# Patient Record
Sex: Male | Born: 1945 | Race: White | Hispanic: No | Marital: Married | Smoking: Former smoker
Health system: Southern US, Community
[De-identification: ages and names within clinical notes are randomized; demographics above are authoritative.]

## PROBLEM LIST (undated history)

## (undated) DIAGNOSIS — R7303 Prediabetes: Secondary | ICD-10-CM

## (undated) DIAGNOSIS — G459 Transient cerebral ischemic attack, unspecified: Secondary | ICD-10-CM

## (undated) DIAGNOSIS — R351 Nocturia: Secondary | ICD-10-CM

## (undated) DIAGNOSIS — R350 Frequency of micturition: Secondary | ICD-10-CM

## (undated) DIAGNOSIS — I10 Essential (primary) hypertension: Secondary | ICD-10-CM

## (undated) DIAGNOSIS — I499 Cardiac arrhythmia, unspecified: Secondary | ICD-10-CM

## (undated) DIAGNOSIS — Z87898 Personal history of other specified conditions: Secondary | ICD-10-CM

## (undated) DIAGNOSIS — M549 Dorsalgia, unspecified: Secondary | ICD-10-CM

## (undated) DIAGNOSIS — I219 Acute myocardial infarction, unspecified: Secondary | ICD-10-CM

## (undated) DIAGNOSIS — I639 Cerebral infarction, unspecified: Secondary | ICD-10-CM

## (undated) DIAGNOSIS — N433 Hydrocele, unspecified: Secondary | ICD-10-CM

## (undated) DIAGNOSIS — R972 Elevated prostate specific antigen [PSA]: Secondary | ICD-10-CM

## (undated) DIAGNOSIS — N400 Enlarged prostate without lower urinary tract symptoms: Secondary | ICD-10-CM

## (undated) DIAGNOSIS — R011 Cardiac murmur, unspecified: Secondary | ICD-10-CM

## (undated) HISTORY — DX: Hydrocele, unspecified: N43.3

## (undated) HISTORY — DX: Benign prostatic hyperplasia without lower urinary tract symptoms: N40.0

## (undated) HISTORY — DX: Elevated prostate specific antigen (PSA): R97.20

## (undated) HISTORY — DX: Nocturia: R35.1

## (undated) HISTORY — DX: Frequency of micturition: R35.0

## (undated) HISTORY — DX: Personal history of other specified conditions: Z87.898

## (undated) HISTORY — DX: Prediabetes: R73.03

## (undated) HISTORY — DX: Transient cerebral ischemic attack, unspecified: G45.9

---
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
## 2006-02-06 ENCOUNTER — Emergency Department (HOSPITAL_COMMUNITY): Admission: EM | Admit: 2006-02-06 | Discharge: 2006-02-06 | Payer: Self-pay | Admitting: Family Medicine

## 2011-03-15 ENCOUNTER — Emergency Department: Payer: Self-pay | Admitting: Emergency Medicine

## 2013-11-02 ENCOUNTER — Encounter (HOSPITAL_COMMUNITY)
Admission: EM | Disposition: A | Discharge status: Home / Self Care | Payer: Self-pay | Source: Home / Self Care | Attending: Cardiology

## 2013-11-02 ENCOUNTER — Emergency Department (HOSPITAL_COMMUNITY): Payer: Medicare Other

## 2013-11-02 ENCOUNTER — Encounter (HOSPITAL_COMMUNITY): Payer: Self-pay | Admitting: Emergency Medicine

## 2013-11-02 ENCOUNTER — Inpatient Hospital Stay (HOSPITAL_COMMUNITY): Payer: Medicare Other

## 2013-11-02 ENCOUNTER — Inpatient Hospital Stay (HOSPITAL_COMMUNITY)
Admission: EM | Admit: 2013-11-02 | Discharge: 2013-11-04 | DRG: 064 | Disposition: A | Discharge status: Home / Self Care | Payer: Medicare Other | Attending: Cardiology | Admitting: Cardiology

## 2013-11-02 ENCOUNTER — Emergency Department: Payer: Self-pay | Admitting: Emergency Medicine

## 2013-11-02 DIAGNOSIS — R4182 Altered mental status, unspecified: Secondary | ICD-10-CM

## 2013-11-02 DIAGNOSIS — I249 Acute ischemic heart disease, unspecified: Secondary | ICD-10-CM

## 2013-11-02 DIAGNOSIS — I10 Essential (primary) hypertension: Secondary | ICD-10-CM | POA: Diagnosis present

## 2013-11-02 DIAGNOSIS — I6340 Cerebral infarction due to embolism of unspecified cerebral artery: Principal | ICD-10-CM | POA: Diagnosis present

## 2013-11-02 DIAGNOSIS — G459 Transient cerebral ischemic attack, unspecified: Secondary | ICD-10-CM

## 2013-11-02 DIAGNOSIS — N400 Enlarged prostate without lower urinary tract symptoms: Secondary | ICD-10-CM | POA: Diagnosis present

## 2013-11-02 DIAGNOSIS — I214 Non-ST elevation (NSTEMI) myocardial infarction: Secondary | ICD-10-CM | POA: Diagnosis present

## 2013-11-02 DIAGNOSIS — Z87891 Personal history of nicotine dependence: Secondary | ICD-10-CM

## 2013-11-02 DIAGNOSIS — F05 Delirium due to known physiological condition: Secondary | ICD-10-CM | POA: Diagnosis present

## 2013-11-02 DIAGNOSIS — R404 Transient alteration of awareness: Secondary | ICD-10-CM

## 2013-11-02 DIAGNOSIS — I359 Nonrheumatic aortic valve disorder, unspecified: Secondary | ICD-10-CM

## 2013-11-02 HISTORY — PX: LEFT HEART CATHETERIZATION WITH CORONARY ANGIOGRAM: SHX5451

## 2013-11-02 HISTORY — DX: Transient cerebral ischemic attack, unspecified: G45.9

## 2013-11-02 HISTORY — DX: Dorsalgia, unspecified: M54.9

## 2013-11-02 LAB — COMPREHENSIVE METABOLIC PANEL
ALBUMIN: 3.4 g/dL (ref 3.4–5.0)
ALK PHOS: 94 U/L
ALT: 16 U/L (ref 0–53)
AST: 18 U/L (ref 0–37)
Albumin: 3.4 g/dL — ABNORMAL LOW (ref 3.5–5.2)
Alkaline Phosphatase: 83 U/L (ref 39–117)
Anion Gap: 4 — ABNORMAL LOW (ref 7–16)
BILIRUBIN TOTAL: 0.3 mg/dL (ref 0.3–1.2)
BUN: 20 mg/dL — ABNORMAL HIGH (ref 7–18)
BUN: 19 mg/dL (ref 6–23)
Bilirubin,Total: 0.3 mg/dL (ref 0.2–1.0)
CALCIUM: 8.6 mg/dL (ref 8.5–10.1)
CHLORIDE: 107 meq/L (ref 96–112)
CO2: 26 meq/L (ref 19–32)
CREATININE: 0.97 mg/dL (ref 0.60–1.30)
CREATININE: 0.78 mg/dL (ref 0.50–1.35)
Calcium: 8.7 mg/dL (ref 8.4–10.5)
Chloride: 110 mmol/L — ABNORMAL HIGH (ref 98–107)
Co2: 31 mmol/L (ref 21–32)
EGFR (African American): >60
GFR calc Af Amer: >90 mL/min (ref >90)
Glucose, Bld: 120 mg/dL — ABNORMAL HIGH (ref 70–99)
Glucose: 106 mg/dL — ABNORMAL HIGH (ref 65–99)
OSMOLALITY: 292 (ref 275–301)
Potassium: 3.5 mmol/L (ref 3.5–5.1)
Potassium: 4.2 mEq/L (ref 3.7–5.3)
SGOT(AST): 24 U/L (ref 15–37)
SGPT (ALT): 25 U/L (ref 12–78)
SODIUM: 145 mmol/L (ref 136–145)
Sodium: 144 mEq/L (ref 137–147)
TOTAL PROTEIN: 6.6 g/dL (ref 6.4–8.2)
Total Protein: 6.2 g/dL (ref 6.0–8.3)

## 2013-11-02 LAB — APTT: APTT: 27 s (ref 24–37)

## 2013-11-02 LAB — TROPONIN I
TROPONIN I: 0.57 ng/mL — AB (ref <0.30)
TROPONIN I: 2.98 ng/mL — AB (ref <0.30)
TROPONIN-I: 0.03 ng/mL
Troponin I: 5.84 ng/mL (ref <0.30)
Troponin I: 6.96 ng/mL (ref <0.30)

## 2013-11-02 LAB — MRSA PCR SCREENING: MRSA by PCR: NEGATIVE

## 2013-11-02 LAB — CBC WITH DIFFERENTIAL/PLATELET
Basophil #: 0 10*3/uL (ref 0.0–0.1)
Basophil %: 0.4 %
Eosinophil #: 0.1 10*3/uL (ref 0.0–0.7)
Eosinophil %: 1.6 %
HCT: 39.5 % — AB (ref 40.0–52.0)
HGB: 13.4 g/dL (ref 13.0–18.0)
Lymphocyte #: 2.2 10*3/uL (ref 1.0–3.6)
Lymphocyte %: 37.4 %
MCH: 30.4 pg (ref 26.0–34.0)
MCHC: 33.9 g/dL (ref 32.0–36.0)
MCV: 90 fL (ref 80–100)
Monocyte #: 0.4 x10 3/mm (ref 0.2–1.0)
Monocyte %: 7.4 %
Neutrophil #: 3.2 10*3/uL (ref 1.4–6.5)
Neutrophil %: 53.2 %
Platelet: 138 10*3/uL — ABNORMAL LOW (ref 150–440)
RBC: 4.41 10*6/uL (ref 4.40–5.90)
RDW: 15.1 % — ABNORMAL HIGH (ref 11.5–14.5)
WBC: 5.9 10*3/uL (ref 3.8–10.6)

## 2013-11-02 LAB — I-STAT CHEM 8, ED
BUN: 18 mg/dL (ref 6–23)
CHLORIDE: 107 meq/L (ref 96–112)
CREATININE: 0.9 mg/dL (ref 0.50–1.35)
Calcium, Ion: 1.14 mmol/L (ref 1.13–1.30)
GLUCOSE: 119 mg/dL — AB (ref 70–99)
HCT: 38 % — ABNORMAL LOW (ref 39.0–52.0)
HEMOGLOBIN: 12.9 g/dL — AB (ref 13.0–17.0)
POTASSIUM: 3.9 meq/L (ref 3.7–5.3)
Sodium: 143 mEq/L (ref 137–147)
TCO2: 27 mmol/L (ref 0–100)

## 2013-11-02 LAB — CBC
HEMATOCRIT: 38.5 % — AB (ref 39.0–52.0)
HEMOGLOBIN: 12.7 g/dL — AB (ref 13.0–17.0)
MCH: 29.1 pg (ref 26.0–34.0)
MCHC: 33 g/dL (ref 30.0–36.0)
MCV: 88.3 fL (ref 78.0–100.0)
Platelets: 136 10*3/uL — ABNORMAL LOW (ref 150–400)
RBC: 4.36 MIL/uL (ref 4.22–5.81)
RDW: 14.3 % (ref 11.5–15.5)
WBC: 5.9 10*3/uL (ref 4.0–10.5)

## 2013-11-02 LAB — CK TOTAL AND CKMB (NOT AT ARMC)
CK, Total: 109 U/L
CK-MB: 2.2 ng/mL (ref 0.5–3.6)

## 2013-11-02 LAB — DIFFERENTIAL
Basophils Absolute: 0 10*3/uL (ref 0.0–0.1)
Basophils Relative: 0 % (ref 0–1)
Eosinophils Absolute: 0.1 10*3/uL (ref 0.0–0.7)
Eosinophils Relative: 1 % (ref 0–5)
LYMPHS ABS: 1 10*3/uL (ref 0.7–4.0)
LYMPHS PCT: 16 % (ref 12–46)
MONO ABS: 0.4 10*3/uL (ref 0.1–1.0)
MONOS PCT: 7 % (ref 3–12)
NEUTROS ABS: 4.4 10*3/uL (ref 1.7–7.7)
Neutrophils Relative %: 76 % (ref 43–77)

## 2013-11-02 LAB — POCT I-STAT TROPONIN I: Troponin i, poc: 0.18 ng/mL (ref 0.00–0.08)

## 2013-11-02 LAB — ETHANOL: Alcohol, Ethyl (B): <11 mg/dL (ref 0–11)

## 2013-11-02 LAB — PROTIME-INR
INR: 1
INR: 0.96 (ref 0.00–1.49)
Prothrombin Time: 12.6 secs (ref 11.5–14.7)
Prothrombin Time: 12.6 seconds (ref 11.6–15.2)

## 2013-11-02 LAB — HEMOGLOBIN A1C
HEMOGLOBIN A1C: 5.6 % (ref <5.7)
Mean Plasma Glucose: 114 mg/dL (ref <117)

## 2013-11-02 SURGERY — LEFT HEART CATHETERIZATION WITH CORONARY ANGIOGRAM
Anesthesia: LOCAL

## 2013-11-02 MED ORDER — SODIUM CHLORIDE 0.9 % IJ SOLN
3.0000 mL | Freq: Two times a day (BID) | INTRAMUSCULAR | Status: DC
  Administered 2013-11-02 – 2013-11-04 (×2): 3 mL via INTRAVENOUS

## 2013-11-02 MED ORDER — SODIUM CHLORIDE 0.9 % IV SOLN
1.0000 mL/kg/h | INTRAVENOUS | Status: AC
Start: 2013-11-02 — End: 2013-11-02
  Administered 2013-11-02: 1 mL/kg/h via INTRAVENOUS

## 2013-11-02 MED ORDER — METOPROLOL TARTRATE 12.5 MG HALF TABLET
12.5000 mg | ORAL_TABLET | Freq: Two times a day (BID) | ORAL | Status: DC
  Administered 2013-11-02 – 2013-11-04 (×5): 12.5 mg via ORAL
  Filled 2013-11-02 (×6): qty 1

## 2013-11-02 MED ORDER — HEPARIN SODIUM (PORCINE) 5000 UNIT/ML IJ SOLN
INTRAMUSCULAR | Status: AC
  Filled 2013-11-02: qty 1

## 2013-11-02 MED ORDER — HEPARIN SODIUM (PORCINE) 1000 UNIT/ML IJ SOLN
INTRAMUSCULAR | Status: AC
  Filled 2013-11-02: qty 1

## 2013-11-02 MED ORDER — FENTANYL CITRATE 0.05 MG/ML IJ SOLN
INTRAMUSCULAR | Status: AC
  Filled 2013-11-02: qty 2

## 2013-11-02 MED ORDER — SODIUM CHLORIDE 0.9 % IJ SOLN: 3.0000 mL | INTRAMUSCULAR | Status: DC | PRN

## 2013-11-02 MED ORDER — ONDANSETRON HCL 4 MG/2ML IJ SOLN
4.0000 mg | Freq: Four times a day (QID) | INTRAMUSCULAR | Status: DC
Start: 2013-11-02 — End: 2013-11-02
  Administered 2013-11-02: 4 mg via INTRAVENOUS
  Filled 2013-11-02: qty 2

## 2013-11-02 MED ORDER — SODIUM CHLORIDE 0.9 % IV SOLN: 250.0000 mL | INTRAVENOUS | Status: DC | PRN

## 2013-11-02 MED ORDER — ASPIRIN 325 MG PO TABS
325.0000 mg | ORAL_TABLET | Freq: Once | ORAL | Status: AC
  Administered 2013-11-02: 325 mg via ORAL
  Filled 2013-11-02: qty 1

## 2013-11-02 MED ORDER — MIDAZOLAM HCL 2 MG/2ML IJ SOLN
INTRAMUSCULAR | Status: AC
  Filled 2013-11-02: qty 2

## 2013-11-02 MED ORDER — LIDOCAINE HCL (PF) 1 % IJ SOLN
INTRAMUSCULAR | Status: AC
  Filled 2013-11-02: qty 30

## 2013-11-02 MED ORDER — NITROGLYCERIN 0.4 MG SL SUBL
0.4000 mg | SUBLINGUAL_TABLET | SUBLINGUAL | Status: DC | PRN
  Administered 2013-11-02 (×2): 0.4 mg via SUBLINGUAL
  Filled 2013-11-02: qty 1

## 2013-11-02 MED ORDER — ONDANSETRON HCL 4 MG/2ML IJ SOLN: 4.0000 mg | Freq: Four times a day (QID) | INTRAMUSCULAR | Status: DC | PRN

## 2013-11-02 MED ORDER — ASPIRIN EC 81 MG PO TBEC
81.0000 mg | DELAYED_RELEASE_TABLET | Freq: Every day | ORAL | Status: DC
  Administered 2013-11-02 – 2013-11-04 (×3): 81 mg via ORAL
  Filled 2013-11-02 (×4): qty 1

## 2013-11-02 MED ORDER — HEPARIN SODIUM (PORCINE) 5000 UNIT/ML IJ SOLN
5000.0000 [IU] | Freq: Three times a day (TID) | INTRAMUSCULAR | Status: DC
  Administered 2013-11-02 – 2013-11-04 (×6): 5000 [IU] via SUBCUTANEOUS
  Filled 2013-11-02 (×9): qty 1

## 2013-11-02 MED ORDER — HEPARIN BOLUS VIA INFUSION
4000.0000 [IU] | Freq: Once | INTRAVENOUS | Status: AC
  Administered 2013-11-02: 4000 [IU] via INTRAVENOUS
  Filled 2013-11-02: qty 4000

## 2013-11-02 MED ORDER — HEPARIN (PORCINE) IN NACL 2-0.9 UNIT/ML-% IJ SOLN
INTRAMUSCULAR | Status: AC
  Filled 2013-11-02: qty 1000

## 2013-11-02 MED ORDER — VERAPAMIL HCL 2.5 MG/ML IV SOLN
INTRAVENOUS | Status: AC
  Filled 2013-11-02: qty 2

## 2013-11-02 MED ORDER — HEPARIN (PORCINE) IN NACL 100-0.45 UNIT/ML-% IJ SOLN
1100.0000 [IU]/h | INTRAMUSCULAR | Status: DC
Start: 2013-11-02 — End: 2013-11-02
  Administered 2013-11-02: 1100 [IU]/h via INTRAVENOUS
  Filled 2013-11-02: qty 250

## 2013-11-02 MED ORDER — ATORVASTATIN CALCIUM 80 MG PO TABS
80.0000 mg | ORAL_TABLET | Freq: Every day | ORAL | Status: DC
  Administered 2013-11-02 – 2013-11-04 (×3): 80 mg via ORAL
  Filled 2013-11-02 (×4): qty 1

## 2013-11-02 MED ORDER — NITROGLYCERIN 0.2 MG/ML ON CALL CATH LAB
INTRAVENOUS | Status: AC
  Filled 2013-11-02: qty 1

## 2013-11-02 NOTE — ED Notes (Signed)
Dr. Venora Maples made aware, pt stating "I feel really hot and I have a sharp pain in the center of my chest".  Repeat EKG accessed.

## 2013-11-02 NOTE — Progress Notes (Signed)
Stroke Team Progress Note  HISTORY Willie Freeman is an 68 y.o. male with no relevant past medical history, brought in as a code stroke due to acute onset of the above stated symptoms.  He was initially evaluated at [facility] and transferred to [facility].  Patient stated that around 11 pm last night he developed a warmness sensation in his chest followed by HA and chest pressure. This took him to [facility] emergency department. He was initially assessed by the ER physician when he developed acute confusion and because there CT scan it was not working he was transferred emergently to the [facility] emergency department.  According to patient and wife, at the time of initial ED evaluation at [facility] there was some weakness of the right side but they denied slurred speech, face weakness, imbalance, focal numbness, vertigo, double vision, language or vision disturbances.  Wife said that for approximately 45 minutes her husband was " confused and with slow thinking process".  NIHSS 1 on immediate presentation to [facility] but rapidly NIHSS 0  CT brain showed no acute abnormality.  EKG showed no atrial fibrillation but nonspecific anterior ST changes with an abnormal ST morphology without criteria for ST elevation MI at that time.  Still complaining of chest pressure.  Date last known well: 11/02/11  Time last known well: 10 pm  tPA Given: no, symptoms resolved  NIHSS: 0  MRS: 0     SUBJECTIVE His daughters x 2 are at the bedside.  Overall he feels his condition is rapidly improving.  No new complaints. I spoke to his wife over the phone  OBJECTIVE Most recent Vital Signs: Filed Vitals:   11/02/13 0630 11/02/13 0730 11/02/13 0800 11/02/13 0900  BP: 151/69 158/72 159/71 134/111  Pulse: 64 60 69 58  Temp: 97.7 F (36.5 C) 97.6 F (36.4 C)    TempSrc: Oral Oral    Resp: 20 11 19 16   Height: 5\' 11"  (1.803 m)     Weight: 171 lb 4.8 oz (77.7 kg)     SpO2: 100% 98% 99% 99%   CBG (last  3)  No results found for this basename: GLUCAP,  in the last 72 hours  IV Fluid Intake:   . sodium chloride 1 mL/kg/hr (11/02/13 1134)    MEDICATIONS  . aspirin EC  81 mg Oral Daily  . atorvastatin  80 mg Oral q1800  . heparin  5,000 Units Subcutaneous 3 times per day  . metoprolol tartrate  12.5 mg Oral BID  . sodium chloride  3 mL Intravenous Q12H   PRN:  sodium chloride, nitroGLYCERIN, ondansetron (ZOFRAN) IV, sodium chloride  Diet:  Cardiac   Activity:  Bedrest  DVT Prophylaxis:  SCDs CLINICALLY SIGNIFICANT STUDIES Basic Metabolic Panel:  Recent Labs Lab 11/02/13 0335 11/02/13 0344  NA 144 143  K 4.2 3.9  CL 107 107  CO2 26  --   GLUCOSE 120* 119*  BUN 19 18  CREATININE 0.78 0.90  CALCIUM 8.7  --    Liver Function Tests:  Recent Labs Lab 11/02/13 0335  AST 18  ALT 16  ALKPHOS 83  BILITOT 0.3  PROT 6.2  ALBUMIN 3.4*   CBC:  Recent Labs Lab 11/02/13 0335 11/02/13 0344  WBC 5.9  --   NEUTROABS 4.4  --   HGB 12.7* 12.9*  HCT 38.5* 38.0*  MCV 88.3  --   PLT 136*  --    Coagulation:  Recent Labs Lab 11/02/13 0335  LABPROT 12.6  INR 0.96   Cardiac Enzymes:  Recent Labs Lab 11/02/13 0309 11/02/13 0737  TROPONINI 0.57* 2.98*   Urinalysis: No results found for this basename: COLORURINE, APPERANCEUR, LABSPEC, PHURINE, GLUCOSEU, HGBUR, BILIRUBINUR, KETONESUR, PROTEINUR, UROBILINOGEN, NITRITE, LEUKOCYTESUR,  in the last 168 hours Lipid Panel No results found for this basename: chol, trig, hdl, cholhdl, vldl, ldlcalc   HgbA1C  No results found for this basename: HGBA1C    Urine Drug Screen:   No results found for this basename: labopia, cocainscrnur, labbenz, amphetmu, thcu, labbarb    Alcohol Level:  Recent Labs Lab 11/02/13 0335  ETH <11    Ct Head Wo Contrast  11/02/2013   CLINICAL DATA:  Confusion and right arm weakness.  EXAM: CT HEAD WITHOUT CONTRAST  TECHNIQUE: Contiguous axial images were obtained from the base of the skull  through the vertex without intravenous contrast.  COMPARISON:  None.  FINDINGS: Mild diffuse cerebral atrophy. No ventricular dilatation. Old appearing lacune or infarct in the left posterior parietal white matter. No mass effect or midline shift. No abnormal extra-axial fluid collections. Gray-white matter junctions are distinct. Basal cisterns are not effaced. No evidence of acute intracranial hemorrhage. No depressed skull fractures. Visualized paranasal sinuses and mastoid air cells are not opacified.  IMPRESSION: No acute intracranial abnormality.  Mild diffuse cerebral atrophy.  Code stroke results called to Dr. Venora Maples at [place] hr on 11/02/2013.   Electronically Signed   By: Lucienne Capers M.D.   On: 11/02/2013 03:11   Dg Chest Portable 1 View  11/02/2013   CLINICAL DATA:  Chest pain, code stroke.  EXAM: PORTABLE CHEST - 1 VIEW  COMPARISON:  DG CHEST 1V PORT dated 11/02/2013 at 0213 hr  FINDINGS: Cardiac silhouette appears mild to moderately enlarged, similar. No pleural effusions or focal consolidations. No pneumothorax. Soft tissue planes and included osseous structures are nonsuspicious. Mild degenerative change of thoracic spine. Multiple EKG lines overlie the patient and may obscure subtle underlying pathology.  IMPRESSION: Stable cardiomegaly, no acute pulmonary process.   Electronically Signed   By: Elon Alas   On: 11/02/2013 03:44       MRI of the brain  pending  MRA of the brain  pending  2D Echocardiogram  Not ordered  Carotid Doppler  pending  CXR    EKG  Nsr. LVH Therapy Recommendations pending Physical Exam   Pleasant middle aged caucasian male not in distress.Awake alert. Afebrile. Head is nontraumatic. Neck is supple without bruit. Hearing is normal. Cardiac exam no murmur or gallop. Lungs are clear to auscultation. Distal pulses are well felt. Neurological Exam :    Awake  Alert oriented x 3.Diminished recall 2/3. Normal speech and language.eye movements full  without nystagmus.fundi were not visualized. Vision acuity and fields appear normal. Hearing is normal. Palatal movements are normal. Face symmetric. Tongue midline. Normal strength, tone, reflexes and coordination. Normal sensation. Gait deferred. ASSESSMENT Mr. Willie Freeman is a 68 y.o. male presenting with transient confusion, memory loss and RUE weakness likely posterior circulation TIA. Imaging pending.  On aspirin 81 mg orally every day prior to admission. Now on aspirin 81 mg orally every day for secondary stroke prevention. Patient with resultant  No deficits. Stroke work up underway.      LDL pending      Hospital day # 0  TREATMENT/PLAN  Continue   aspirin 81 mg orally every day for secondary stroke prevention.  Mobilize out of bed, Pt/OT consults  Check brain MRI, dopplers, lipids  TEE  in am per Dr Einar Gip to eval aortic stenosis  Patient will not be eligible for SOCRATES trial given high probability of needing aortic valve replacement due to his severe aortic stenosis  D/w Dr Einar Gip and family  Antony Contras, MD 11/02/2013 11:58 AM  I   To contact Stroke Continuity provider, please refer to [email or web address] After hours, contact General Neurology

## 2013-11-02 NOTE — ED Notes (Signed)
Venora Maples, [place], MD at bedside.

## 2013-11-02 NOTE — Code Documentation (Signed)
Patient in normal state of health this evening until he developed a warm sensation all over along with chest pressure and was taken to [facility] ED. While at [facility] ED, that patient developed acute confusion and was transported to [facility] as a Code Stroke as the [place] was inoperable. Upon arrival to [facility] patient initially confused on what month it was, but later was able to state that it was currently May. NIH 01 initially, follow up NIH 0. Labs performed at [facility]. Will continue to monitor, no acute interventions necessary due to rapidly improving mild symptoms.

## 2013-11-02 NOTE — ED Notes (Signed)
Report given to Amy with the Cath Lab.

## 2013-11-02 NOTE — Progress Notes (Signed)
ANTICOAGULATION CONSULT NOTE - Initial Consult  Pharmacy Consult for Heparin Indication: chest pain/ACS  No Known Allergies  Patient Measurements: Height: 5\' 11"  (180.3 cm) Weight: 177 lb (80.287 kg) IBW/kg (Calculated) : 75.3  Vital Signs: Temp: 97.7 F (36.5 C) (05/13 0350) Temp src: Oral (05/13 0314) BP: 151/77 mmHg (05/13 0345) Pulse Rate: 58 (05/13 0345)  Labs:  Recent Labs  11/02/13 0335 11/02/13 0344  HGB 12.7* 12.9*  HCT 38.5* 38.0*  PLT 136*  --   APTT 27  --   LABPROT 12.6  --   INR 0.96  --   CREATININE  --  0.90    Estimated Creatinine Clearance: 82.5 ml/min (by C-G formula based on Cr of 0.9).   Medical History: History reviewed. No pertinent past medical history.  Medications:  See electronic med rec  Assessment: 68 y.o. male presented to [place] with chest pressure. Developed acute confusion and transferred to [facility] and evaluated for Code Stroke - CT nl and symptoms resolved quickly. Now pt to begin heparin for r/o ACS. Hgb ok, plt a bit low at baseline. INR wnl.   Goal of Therapy:  Heparin level 0.3-0.7 units/ml Monitor platelets by anticoagulation protocol: Yes   Plan:  1. Heparin IV bolus 4000 units 2. Heparin IV gtt at 1100 units/hr 3. Will f/u 6 hr heparin level 4. Daily heparin level and CBC  Sherlon Handing, PharmD, BCPS Clinical pharmacist, pager [phone] 11/02/2013,4:00 AM

## 2013-11-02 NOTE — ED Notes (Signed)
Family at bedside.  Dr. Venora Maples at bedside.

## 2013-11-02 NOTE — Evaluation (Signed)
Physical Therapy Evaluation and Discharge Patient Details Name: Willie Freeman MRN: [id number] DOB: 07-01-1945 Today's Date: 11/02/2013   History of Present Illness  Patient is a fairly active 68 year old male with no significant PMH, woke up at around 11 PM last night on 1212/2015 with chest pressure and feeling warm. Tried to walk in the house but started to feel worse. He was taken to [facility] and there he was evaluated for acute coronary syndrome with nonspecific ST segment changes and T-wave changes. However while being worked up  patient with acute confusional state and right-sided weakness. He was then transferred emergently to [facility] as "code stroke". He was evaluated by neurology, was felt to have left brain TIA.  Clinical Impression  Patient evaluated by Physical Therapy with no further acute PT needs identified. All education has been completed and the patient has no further questions. At the time of PT eval pt was able to perform all mobility independently and demonstrated good safety awareness. Pt states he is at his baseline of function and does not feel he needs further PT intervention. See below for any follow-up Physial Therapy or equipment needs. PT is signing off. Thank you for this referral.     Follow Up Recommendations No PT follow up    Equipment Recommendations  None recommended by PT    Recommendations for Other Services       Precautions / Restrictions Precautions Precautions: Fall Restrictions Weight Bearing Restrictions: No      Mobility  Bed Mobility Overal bed mobility: Independent             General bed mobility comments: No physical assist required. Pt was able to tranfer to/from EOB in a timely manner without use of bed rails for support.   Transfers Overall transfer level: Independent Equipment used: None             General transfer comment: No assist required. No unsteadiness or LOB noted.  Ambulation/Gait Ambulation/Gait  assistance: Independent Ambulation Distance (Feet): 300 Feet Assistive device: None Gait Pattern/deviations: WFL(Within Functional Limits)   Gait velocity interpretation: at or above normal speed for age/gender General Gait Details: No unsteadiness or LOB noted. Pt states he is at his baseline.   Stairs            Wheelchair Mobility    Modified Rankin (Stroke Patients Only)       Balance Overall balance assessment: Independent                                           Pertinent Vitals/Pain     Home Living Family/patient expects to be discharged to:: Private residence Living Arrangements: Spouse/significant other Available Help at Discharge: Family;Available 24 hours/day Type of Home: House (townhouse) Home Access: Stairs to enter   Technical brewer of Steps: 1 Home Layout: One level Home Equipment: [place] - single point      Prior Function Level of Independence: Independent         Comments: Driving, working full time      Journalist, newspaper   Dominant Hand: Right    Extremity/Trunk Assessment   Upper Extremity Assessment: Overall WFL for tasks assessed           Lower Extremity Assessment: Overall WFL for tasks assessed      Cervical / Trunk Assessment: Normal  Communication   Communication: No difficulties  Cognition  Arousal/Alertness: Awake/alert Behavior During Therapy: WFL for tasks assessed/performed Overall Cognitive Status: Within Functional Limits for tasks assessed                      General Comments      Exercises        Assessment/Plan    PT Assessment Patent does not need any further PT services  PT Diagnosis     PT Problem List    PT Treatment Interventions     PT Goals (Current goals can be found in the Care Plan section) Acute Rehab PT Goals Patient Stated Goal: To return home PT Goal Formulation: No goals set, d/c therapy    Frequency     Barriers to discharge         Co-evaluation               End of Session Equipment Utilized During Treatment: Gait belt Activity Tolerance: Patient tolerated treatment well Patient left: in bed;with call bell/phone within reach;with family/visitor present Nurse Communication: Mobility status         Time: 1115-5208 PT Time Calculation (min): 12 min   Charges:   PT Evaluation $Initial PT Evaluation Tier I: 1 Procedure     PT G CodesJolyn Lent 11/02/2013, 4:38 PM  Jolyn Lent, PT, DPT Acute Rehabilitation Services Pager: [phone]

## 2013-11-02 NOTE — Consult Note (Addendum)
NEURO HOSPITALIST CONSULT NOTE    Reason for Consult: confusion, right sided weakness (resolved) Referring Physician: ED    Chief Complaint: CODE STROKE:confusion, right sided weakness (resolved)  HPI:                                                                                                                                         Willie Freeman is an 68 y.o. male with no relevant past medical history, brought in as a code stroke due to acute onset of the above stated symptoms. He was initially evaluated at [facility] and transferred to [facility]. Patient stated that around 11 pm last night he developed a warmness sensation in his chest followed by HA and chest pressure. This took him to [facility] emergency department. He was initially assessed by the ER physician when he developed acute confusion and because there CT scan it was not working he was transferred emergently to the [facility] emergency department. According to patient and wife, at the time of initial ED evaluation at [facility] there was some weakness of the right side but they denied slurred speech, face weakness, imbalance, focal numbness, vertigo, double vision, language or vision disturbances. Wife said that for approximately 45 minutes her husband was " confused and with slow thinking process". NIHSS 1 on immediate presentation to [facility] but rapidly NIHSS 0 CT brain showed no acute abnormality. EKG showed no atrial fibrillation but nonspecific anterior ST changes with an abnormal ST morphology without criteria for ST elevation MI at that time. Still complaining of chest pressure.  Date last known well: 11/02/11 Time last known well:  10 pm tPA Given: no, symptoms resolved NIHSS: 0 MRS: 0  History reviewed. No pertinent past medical history.  History reviewed. No pertinent past surgical history.  History reviewed. No pertinent family history. Social History:  reports that he has quit  smoking. He does not have any smokeless tobacco history on file. He reports that he drinks alcohol. His drug history is not on file.  Allergies: No Known Allergies  Medications:                                                                                                                           I have  reviewed the patient's current medications.  ROS:                                                                                                                                       History obtained from the patient, wife, and chart review  General ROS: negative for - chills, fatigue, fever, night sweats, weight gain or weight loss Psychological ROS: negative for - behavioral disorder, hallucinations, memory difficulties, mood swings or suicidal ideation Ophthalmic ROS: negative for - blurry vision, double vision, eye pain or loss of vision ENT ROS: negative for - epistaxis, nasal discharge, oral lesions, sore throat, tinnitus or vertigo Allergy and Immunology ROS: negative for - hives or itchy/watery eyes Hematological and Lymphatic ROS: negative for - bleeding problems, bruising or swollen lymph nodes Endocrine ROS: negative for - galactorrhea, hair pattern changes, polydipsia/polyuria or temperature intolerance Respiratory ROS: negative for - cough, hemoptysis, shortness of breath or wheezing Cardiovascular ROS: negative for - dyspnea on exertion, edema or irregular heartbeat Gastrointestinal ROS: negative for - abdominal pain, diarrhea, hematemesis, nausea/vomiting or stool incontinence Genito-Urinary ROS: negative for - dysuria, hematuria, incontinence or urinary frequency/urgency Musculoskeletal ROS: negative for - joint swelling or muscular weakness Neurological ROS: as noted in HPI Dermatological ROS: negative for rash and skin lesion changes  Physical exam: pleasant male in no apparent distress.Blood pressure 151/77, pulse 58, temperature 97.7 F (36.5 C), temperature source  Oral, resp. rate 18, height 5\' 11"  (1.803 m), weight 80.287 kg (177 lb), SpO2 99.00%. Head: normocephalic. Neck: supple, no bruits, no JVD. Cardiac: no murmurs. Lungs: clear. Abdomen: soft, no tender, no mass. Extremities: no edema. CV: pulses palpable throughout  Neurologic Examination:                                                                                                      Mental Status: Alert, oriented, thought content appropriate.  Speech fluent without evidence of aphasia.  Able to follow 3 step commands without difficulty. Cranial Nerves: II: Discs flat bilaterally; Visual fields grossly normal, pupils equal, round, reactive to light and accommodation III,IV, VI: ptosis not present, extra-ocular motions intact bilaterally V,VII: smile symmetric, facial light touch sensation normal bilaterally VIII: hearing normal bilaterally IX,X: gag reflex present XI: bilateral shoulder shrug XII: midline tongue extension without atrophy or fasciculations Motor: Right : Upper extremity   5/5    Left:     Upper extremity   5/5  Lower extremity   5/5     Lower  extremity   5/5 Tone and bulk:normal tone throughout; no atrophy noted Sensory: Pinprick and light touch intact throughout, bilaterally Deep Tendon Reflexes:  Right: Upper Extremity   Left: Upper extremity   biceps (C-5 to C-6) 2/4   biceps (C-5 to C-6) 2/4 tricep (C7) 2/4    triceps (C7) 2/4 Brachioradialis (C6) 2/4  Brachioradialis (C6) 2/4  Lower Extremity Lower Extremity  quadriceps (L-2 to L-4) 2/4   quadriceps (L-2 to L-4) 2/4 Achilles (S1) 2/4   Achilles (S1) 2/4 Plantars: Right: downgoing   Left: downgoing Cerebellar: normal finger-to-nose,  normal heel-to-shin test Gait:  No ataxia. Results for orders placed during the hospital encounter of 11/02/13 (from the past 48 hour(s))  PROTIME-INR     Status: None   Collection Time    11/02/13  3:35 AM      Result Value Ref Range   Prothrombin Time 12.6  11.6 -  15.2 seconds   INR 0.96  0.00 - 1.49  APTT     Status: None   Collection Time    11/02/13  3:35 AM      Result Value Ref Range   aPTT 27  24 - 37 seconds  CBC     Status: Abnormal   Collection Time    11/02/13  3:35 AM      Result Value Ref Range   WBC 5.9  4.0 - 10.5 K/uL   RBC 4.36  4.22 - 5.81 MIL/uL   Hemoglobin 12.7 (*) 13.0 - 17.0 g/dL   HCT 38.5 (*) 39.0 - 52.0 %   MCV 88.3  78.0 - 100.0 fL   MCH 29.1  26.0 - 34.0 pg   MCHC 33.0  30.0 - 36.0 g/dL   RDW 14.3  11.5 - 15.5 %   Platelets 136 (*) 150 - 400 K/uL  DIFFERENTIAL     Status: None   Collection Time    11/02/13  3:35 AM      Result Value Ref Range   Neutrophils Relative % 76  43 - 77 %   Neutro Abs 4.4  1.7 - 7.7 K/uL   Lymphocytes Relative 16  12 - 46 %   Lymphs Abs 1.0  0.7 - 4.0 K/uL   Monocytes Relative 7  3 - 12 %   Monocytes Absolute 0.4  0.1 - 1.0 K/uL   Eosinophils Relative 1  0 - 5 %   Eosinophils Absolute 0.1  0.0 - 0.7 K/uL   Basophils Relative 0  0 - 1 %   Basophils Absolute 0.0  0.0 - 0.1 K/uL  I-STAT CHEM 8, ED     Status: Abnormal   Collection Time    11/02/13  3:44 AM      Result Value Ref Range   Sodium 143  137 - 147 mEq/L   Potassium 3.9  3.7 - 5.3 mEq/L   Chloride 107  96 - 112 mEq/L   BUN 18  6 - 23 mg/dL   Creatinine, Ser 0.90  0.50 - 1.35 mg/dL   Glucose, Bld 119 (*) 70 - 99 mg/dL   Calcium, Ion 1.14  1.13 - 1.30 mmol/L   TCO2 27  0 - 100 mmol/L   Hemoglobin 12.9 (*) 13.0 - 17.0 g/dL   HCT 38.0 (*) 39.0 - 52.0 %   Assessment/Plan: 68 y/o without relevant past medical history brought in after sustaining a transient episode of confusion (45 minutes) and very short lived right sided weakness in association with chest pressure.  CT brain negative for acute abnormality. NIHSS 1 on initial exam but NIHSS 0. Left brain TIA, possible. EKG changes, elevated troponin, ongoing chest pressure concerning for acute coronary syndrome and patient to be taken to the cardiac cath. TIA work up.  Stroke team to follow 5/14.  Dorian Pod, MD 11/02/2013, 4:15 AM  [facility]

## 2013-11-02 NOTE — ED Provider Notes (Signed)
CSN: [id number]     Arrival date & time 11/02/13  0302 History   First MD Initiated Contact with Patient 11/02/13 [phone]     Chief Complaint  Patient presents with  . Code Stroke    An emergency department physician performed an initial assessment on this suspected stroke patient at [facility].  HPI Patient presents emergency department as a transfer from the outside hospital.  He was transferred for transient confusion and possible right-sided weakness and transferred as a code stroke.  Patient's a complete resolution of all of his symptoms regarding his neurologic complaints.  His initial issue that occurred was the developed a warmness sensation in his head a mild headache and developed some chest pressure that began around 11 PM.  This took him to [facility] emergency department.  He was initially assessed by the ER physician when he developed acute confusion and because there CT scan it was not working he was transferred emergently to the [facility] come emergency department.  Patient has no prior history of cardiac disease.  His cardiac risk factors are a family history of cardiac disease and prior tobacco abuse.  He no longer smokes cigarettes.  He states his chest pressure is improved but is still having some pressure at this time.  There is no radiation of this pressure.  No shortness of breath.  No diaphoresis.  Denies abdominal pain.  No urinary complaints.  No weakness of his arms or legs.  Family reports that his confusion has completely resolved.   History reviewed. No pertinent past medical history. History reviewed. No pertinent past surgical history. History reviewed. No pertinent family history. History  Substance Use Topics  . Smoking status: Former Research scientist (life sciences)  . Smokeless tobacco: Not on file  . Alcohol Use: Yes     Comment: Rarely    Review of Systems  All other systems reviewed and are negative.     Allergies  Review of patient's allergies indicates no known  allergies.  Home Medications   Prior to Admission medications   Medication Sig Start Date End Date Taking? Authorizing Provider  finasteride (PROSCAR) 5 MG tablet Take 5 mg by mouth daily.  10/03/13  Yes Historical Provider, MD  tamsulosin (FLOMAX) 0.4 MG CAPS capsule Take 0.4 mg by mouth daily.  10/03/13  Yes Historical Provider, MD  VESICARE 5 MG tablet Take 5 mg by mouth daily.  10/07/13  Yes Historical Provider, MD   BP 151/77  Pulse 58  Temp(Src) 97.7 F (36.5 C) (Oral)  Resp 18  Ht 5\' 11"  (1.803 m)  Wt 177 lb (80.287 kg)  BMI 24.70 kg/m2  SpO2 99% Physical Exam  Nursing note and vitals reviewed. Constitutional: He is oriented to person, place, and time. He appears well-developed and well-nourished.  HENT:  Head: Normocephalic and atraumatic.  Eyes: EOM are normal. Pupils are equal, round, and reactive to light.  Neck: Normal range of motion.  Cardiovascular: Normal rate, regular rhythm, normal heart sounds and intact distal pulses.   Pulmonary/Chest: Effort normal and breath sounds normal. No respiratory distress.  Abdominal: Soft. He exhibits no distension. There is no tenderness.  Musculoskeletal: Normal range of motion.  Neurological: He is alert and oriented to person, place, and time.  5/5 strength in major muscle groups of  bilateral upper and lower extremities. Speech normal. No facial asymetry.   Skin: Skin is warm and dry.  Psychiatric: He has a normal mood and affect. Judgment normal.    ED Course  Procedures (including  critical care time) CRITICAL CARE Performed by: Hoy Morn Total critical care time: 35 Critical care time was exclusive of separately billable procedures and treating other patients. Critical care was necessary to treat or prevent imminent or life-threatening deterioration. Critical care was time spent personally by me on the following activities: development of treatment plan with patient and/or surrogate as well as nursing, discussions  with consultants, evaluation of patient's response to treatment, examination of patient, obtaining history from patient or surrogate, ordering and performing treatments and interventions, ordering and review of laboratory studies, ordering and review of radiographic studies, pulse oximetry and re-evaluation of patient's condition.   Labs Review Labs Reviewed  CBC - Abnormal; Notable for the following:    Hemoglobin 12.7 (*)    HCT 38.5 (*)    Platelets 136 (*)    All other components within normal limits  COMPREHENSIVE METABOLIC PANEL - Abnormal; Notable for the following:    Glucose, Bld 120 (*)    Albumin 3.4 (*)    All other components within normal limits  TROPONIN I - Abnormal; Notable for the following:    Troponin I 0.57 (*)    All other components within normal limits  I-STAT CHEM 8, ED - Abnormal; Notable for the following:    Glucose, Bld 119 (*)    Hemoglobin 12.9 (*)    HCT 38.0 (*)    All other components within normal limits  ETHANOL  PROTIME-INR  APTT  DIFFERENTIAL  HEPARIN LEVEL (UNFRACTIONATED)    Imaging Review Ct Head Wo Contrast  11/02/2013   CLINICAL DATA:  Confusion and right arm weakness.  EXAM: CT HEAD WITHOUT CONTRAST  TECHNIQUE: Contiguous axial images were obtained from the base of the skull through the vertex without intravenous contrast.  COMPARISON:  None.  FINDINGS: Mild diffuse cerebral atrophy. No ventricular dilatation. Old appearing lacune or infarct in the left posterior parietal white matter. No mass effect or midline shift. No abnormal extra-axial fluid collections. Gray-white matter junctions are distinct. Basal cisterns are not effaced. No evidence of acute intracranial hemorrhage. No depressed skull fractures. Visualized paranasal sinuses and mastoid air cells are not opacified.  IMPRESSION: No acute intracranial abnormality.  Mild diffuse cerebral atrophy.  Code stroke results called to Dr. Venora Maples at [place] [facility] on 11/02/2013.   Electronically  Signed   By: Lucienne Capers M.D.   On: 11/02/2013 03:11   Dg Chest Portable 1 View  11/02/2013   CLINICAL DATA:  Chest pain, code stroke.  EXAM: PORTABLE CHEST - 1 VIEW  COMPARISON:  DG CHEST 1V PORT dated 11/02/2013 at 0213 hr  FINDINGS: Cardiac silhouette appears mild to moderately enlarged, similar. No pleural effusions or focal consolidations. No pneumothorax. Soft tissue planes and included osseous structures are nonsuspicious. Mild degenerative change of thoracic spine. Multiple EKG lines overlie the patient and may obscure subtle underlying pathology.  IMPRESSION: Stable cardiomegaly, no acute pulmonary process.   Electronically Signed   By: Elon Alas   On: 11/02/2013 03:44  I personally reviewed the imaging tests through PACS system I reviewed available ER/hospitalization records through the EMR   ECG interpretation  Date: 11/02/2013  Rate: 58  Rhythm: normal sinus rhythm  QRS Axis: normal  Intervals: normal  ST/T Wave abnormalities: Inferior T-wave inversions, nonspecific anterior ST changes likely secondary to LVH  Conduction Disutrbances: LVH  Narrative Interpretation:   Old EKG Reviewed: no prior ecg   ECG interpretation  0410AM  Date: 11/02/2013  Rate: 60  Rhythm: normal  sinus rhythm  QRS Axis: normal  Intervals: normal  ST/T Wave abnormalities: Nonspecific anterior ST changes  Conduction Disutrbances: none  Narrative Interpretation:   Old EKG Reviewed: Inferior T-wave inversion has resolved nonspecific anterior ST changes with morphology changing from his prior EKG No significant changes noted       MDM   Final diagnoses:  Acute coronary syndrome  Altered awareness, transient   Complete resolution of altered mental status.  This could represent a TIA.  A more concerned about his chest pressure that brought him initially to the emergency department.  His EKG from [facility] was concerning with nonspecific anterior ST changes with an abnormal ST  morphology without criteria for ST elevation MI at that time.  He still has some chest pressure at this time but seems to be improving.  His repeat EKG he looks better although there does appear to be some dynamic EKG changes.  I discussed this case with my interventional cardiologist to review his EKGs and agreed that if the patient is feeling better at this time he likely does not need to go to the Cath Lab.  His been given aspirin.  We started on heparin drip at this time.  We'll continue to monitor the patient closely in the emergency department.  Please see neurology consultation notes for complete details.   4:09 AM Spoke with cardiology who will evaluate the patient in the ER for admission.   4:24 AM Spoke with Dr Einar Gip, on call for unassigned, who elected the patient to the Cath Lab at this time given his ongoing chest pressure and his elevation in his troponin.  Repeat EKG demonstrates again dynamic EKG changes concerning for acute coronary syndrome.  I agree with the evaluation of this patient urgently/emergently in the Cath Lab.    Hoy Morn, MD 11/02/13 [phone]

## 2013-11-02 NOTE — ED Notes (Signed)
Pt was a pt at [facility] for chest pain, when he started having confusion and right sided weakness at 0150. Pt transferred here due to the CT scanner at [facility] being inoperable.

## 2013-11-02 NOTE — ED Notes (Signed)
Dr. Campos at bedside   

## 2013-11-02 NOTE — H&P (Signed)
Willie Freeman is an 68 y.o. male.   Chief Complaint: Chest pressure HPI: Patient is a fairly active 67 year old Caucasian male history of BPH, otherwise has not seen a physician in many years, woke up at around 11 PM last night on 1212/2050 with chest pressure, feeling warm, right to walk in the house but started to feel worse. He was taken to [facility] there he was evaluated for acute coronary syndrome with nonspecific ST segment changes and T-wave changes. However while being worked up for ACS, patient her acute confusional state and right-sided weakness. He was then transferred emergently to [facility] as "code stroke". He was evaluated by neurology, was felt to have left brain TIA. However patient continues to have chest discomfort associated with EKG changes of subtle ST segment elevation in the lateral leads which were back to baseline, with resolution of chest pain. He was being admitted as his heels, but patient started to have chest pain again, feeling of warmth and again a repeat EKG reveals T wave inversion in the inferior leads. With dynamic EKG changes and ongoing chest pain I was called to admit the patient.  His family members including his wife at the bedside. Patient presently states that he feels well except feeling a little warm and sweaty.  There is no history of diabetes mellitus, hyperlipidemia hypertension that he can November but patient has not seen physicians in many years. On further questioning his wife states that he was told to have a leaky valve about 25 years ago. No other significant history. Patient said that he does not have any bowel or bladder disturbances, denies symptoms of neurologic deficits, no recent weight.  Past Medical History  Diagnosis Date  . Back pain     Past Surgical History  Procedure Laterality Date  . Hernia repair      History reviewed. No pertinent family history. Social History:  reports that he has quit  smoking. He does not have any smokeless tobacco history on file. He reports that he drinks alcohol. His drug history is not on file.  Allergies: No Known Allergies  Review of Systems - please see the chart yet, other systems are negative.  Blood pressure 142/73, pulse 62, temperature 97.7 F (36.5 C), temperature source Oral, resp. rate 18, height 5' 11"  (1.803 m), weight 80.287 kg (177 lb), SpO2 99.00%. General appearance: alert, cooperative, appears stated age, no distress and He appears to and has marfanoid appearance. Eyes: negative findings: lids and lashes normal and conjunctivae and sclerae normal Neck: no adenopathy, no JVD, supple, symmetrical, trachea midline and thyroid not enlarged, symmetric, no tenderness/mass/nodules Neck: JVP - normal, carotids 2+= without bruits Resp: clear to auscultation bilaterally Chest wall: no tenderness Cardio: S1, S2 normal and There is a blowing ejection systolic murmur 3/6, and along early diastolic murmur of grade 3/4 intensity heard in the right sternal border. Also heard in the apex. Conducted to the carotids. GI: soft, non-tender; bowel sounds normal; no masses,  no organomegaly Extremities: extremities normal, atraumatic, no cyanosis or edema Pulses: 2+ and symmetric Prominent bilateral carotid artery bruit present. Skin: Skin color, texture, turgor normal. No rashes or lesions Neurologic: Grossly normal  Results for orders placed during the hospital encounter of 11/02/13 (from the past 48 hour(s))  TROPONIN I     Status: Abnormal   Collection Time    11/02/13  3:09 AM      Result Value Ref Range   Troponin I 0.57 (*) <0.30  ng/mL   Comment:            Due to the release kinetics of cTnI,     a negative result within the first hours     of the onset of symptoms does not rule out     myocardial infarction with certainty.     If myocardial infarction is still suspected,     repeat the test at appropriate intervals.     CRITICAL RESULT  CALLED TO, READ BACK BY AND VERIFIED WITH:     K.COLLINS RN 0423 11/02/13 E.GADDY  ETHANOL     Status: None   Collection Time    11/02/13  3:35 AM      Result Value Ref Range   Alcohol, Ethyl (B) <11  0 - 11 mg/dL   Comment:            LOWEST DETECTABLE LIMIT FOR     SERUM ALCOHOL IS 11 mg/dL     FOR MEDICAL PURPOSES ONLY  PROTIME-INR     Status: None   Collection Time    11/02/13  3:35 AM      Result Value Ref Range   Prothrombin Time 12.6  11.6 - 15.2 seconds   INR 0.96  0.00 - 1.49  APTT     Status: None   Collection Time    11/02/13  3:35 AM      Result Value Ref Range   aPTT 27  24 - 37 seconds  CBC     Status: Abnormal   Collection Time    11/02/13  3:35 AM      Result Value Ref Range   WBC 5.9  4.0 - 10.5 K/uL   RBC 4.36  4.22 - 5.81 MIL/uL   Hemoglobin 12.7 (*) 13.0 - 17.0 g/dL   HCT 38.5 (*) 39.0 - 52.0 %   MCV 88.3  78.0 - 100.0 fL   MCH 29.1  26.0 - 34.0 pg   MCHC 33.0  30.0 - 36.0 g/dL   RDW 14.3  11.5 - 15.5 %   Platelets 136 (*) 150 - 400 K/uL  DIFFERENTIAL     Status: None   Collection Time    11/02/13  3:35 AM      Result Value Ref Range   Neutrophils Relative % 76  43 - 77 %   Neutro Abs 4.4  1.7 - 7.7 K/uL   Lymphocytes Relative 16  12 - 46 %   Lymphs Abs 1.0  0.7 - 4.0 K/uL   Monocytes Relative 7  3 - 12 %   Monocytes Absolute 0.4  0.1 - 1.0 K/uL   Eosinophils Relative 1  0 - 5 %   Eosinophils Absolute 0.1  0.0 - 0.7 K/uL   Basophils Relative 0  0 - 1 %   Basophils Absolute 0.0  0.0 - 0.1 K/uL  COMPREHENSIVE METABOLIC PANEL     Status: Abnormal   Collection Time    11/02/13  3:35 AM      Result Value Ref Range   Sodium 144  137 - 147 mEq/L   Potassium 4.2  3.7 - 5.3 mEq/L   Chloride 107  96 - 112 mEq/L   CO2 26  19 - 32 mEq/L   Glucose, Bld 120 (*) 70 - 99 mg/dL   BUN 19  6 - 23 mg/dL   Creatinine, Ser 0.78  0.50 - 1.35 mg/dL   Calcium 8.7  8.4 - 10.5 mg/dL   Total  Protein 6.2  6.0 - 8.3 g/dL   Albumin 3.4 (*) 3.5 - 5.2 g/dL   AST 18  0  - 37 U/L   Comment: HEMOLYSIS AT THIS LEVEL MAY AFFECT RESULT   ALT 16  0 - 53 U/L   Alkaline Phosphatase 83  39 - 117 U/L   Total Bilirubin 0.3  0.3 - 1.2 mg/dL   GFR calc non Af Amer >90  >90 mL/min   GFR calc Af Amer >90  >90 mL/min   Comment: (NOTE)     The eGFR has been calculated using the CKD EPI equation.     This calculation has not been validated in all clinical situations.     eGFR's persistently <90 mL/min signify possible Chronic Kidney     Disease.  I-STAT CHEM 8, ED     Status: Abnormal   Collection Time    11/02/13  3:44 AM      Result Value Ref Range   Sodium 143  137 - 147 mEq/L   Potassium 3.9  3.7 - 5.3 mEq/L   Chloride 107  96 - 112 mEq/L   BUN 18  6 - 23 mg/dL   Creatinine, Ser 0.90  0.50 - 1.35 mg/dL   Glucose, Bld 119 (*) 70 - 99 mg/dL   Calcium, Ion 1.14  1.13 - 1.30 mmol/L   TCO2 27  0 - 100 mmol/L   Hemoglobin 12.9 (*) 13.0 - 17.0 g/dL   HCT 38.0 (*) 39.0 - 52.0 %   Ct Head Wo Contrast  11/02/2013   CLINICAL DATA:  Confusion and right arm weakness.  EXAM: CT HEAD WITHOUT CONTRAST  TECHNIQUE: Contiguous axial images were obtained from the base of the skull through the vertex without intravenous contrast.  COMPARISON:  None.  FINDINGS: Mild diffuse cerebral atrophy. No ventricular dilatation. Old appearing lacune or infarct in the left posterior parietal white matter. No mass effect or midline shift. No abnormal extra-axial fluid collections. Gray-white matter junctions are distinct. Basal cisterns are not effaced. No evidence of acute intracranial hemorrhage. No depressed skull fractures. Visualized paranasal sinuses and mastoid air cells are not opacified.  IMPRESSION: No acute intracranial abnormality.  Mild diffuse cerebral atrophy.  Code stroke results called to Dr. Venora Maples at [place] [facility] on 11/02/2013.   Electronically Signed   By: Lucienne Capers M.D.   On: 11/02/2013 03:11   Dg Chest Portable 1 View  11/02/2013   CLINICAL DATA:  Chest pain, code stroke.   EXAM: PORTABLE CHEST - 1 VIEW  COMPARISON:  DG CHEST 1V PORT dated 11/02/2013 at 0213 hr  FINDINGS: Cardiac silhouette appears mild to moderately enlarged, similar. No pleural effusions or focal consolidations. No pneumothorax. Soft tissue planes and included osseous structures are nonsuspicious. Mild degenerative change of thoracic spine. Multiple EKG lines overlie the patient and may obscure subtle underlying pathology.  IMPRESSION: Stable cardiomegaly, no acute pulmonary process.   Electronically Signed   By: Elon Alas   On: 11/02/2013 03:44    Labs:   Lab Results  Component Value Date   WBC 5.9 11/02/2013   HGB 12.9* 11/02/2013   HCT 38.0* 11/02/2013   MCV 88.3 11/02/2013   PLT 136* 11/02/2013    Recent Labs Lab 11/02/13 0335 11/02/13 0344  NA 144 143  K 4.2 3.9  CL 107 107  CO2 26  --   BUN 19 18  CREATININE 0.78 0.90  CALCIUM 8.7  --   PROT 6.2  --  BILITOT 0.3  --   ALKPHOS 83  --   ALT 16  --   AST 18  --   GLUCOSE 120* 119*   Lab Results  Component Value Date   TROPONINI 0.57* 11/02/2013    Lipid Panel  No results found for this basename: chol, trig, hdl, cholhdl, vldl, ldlcalc    EKG: 1:46 AM on 11/02/2013: Sinus rhythm with rate of 55 beats a minute, LVH with repolarization of Melody, minimal ST elevation with T wave inversion in V4 V5 and V6. ST depression in lead 1-lead to an AV of with nonspecific T. inversion. At 3:12 AM ST segments in the lateral leads back to baseline, persistent T wave inversion inferiorly and laterally to suggest LVH with repolarization abnormality. 4:10 AM nonspecific ST depression inferiorly, T waves are upright in all leads except aVL. 4:15 AM recurrence of ST segment depression in inferior leads associated with ST depression in lateral leads with T wave inversion.  Assessment/Plan 1. Non-ST elevation myocardial infarction with ongoing chest pain, dynamic EKG changes. 2. Hypertension by EKG criteria and elevated systolic blood  pressure on admission 3. TIA, that occurred this morning. 4. Valvular heart disease, I suspect he probably has moderate if not moderately severe aortic regurgitation. All the peripheral pulses are intact, no suspicion for aortic dissection. Chest pain present anteriorly. No radiation to the back. No mediastinal enlargement by chest x-ray. 5. 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  Recommendation: Patient will be taken emergently to the cardiac catheterization lab. I've discussed the risks associated with coronary angiography including a 20% risk of recurrence of TIA, stroke 1-2% risk of death, MI, need for urgent CABG, bleeding, infection, but not limited to these. I have discussed with neurology, and there is no contraindication for proceeding with coronary angiography or use of antiplatelet agents or anticoagulants.  Laverda Page, MD 11/02/2013, 5:01 AM [facility]. [place] Pager: [phone] Office: [phone] If no answer: Cell:  [phone]

## 2013-11-02 NOTE — ED Notes (Signed)
Troponin 0.57 Willie Freeman with lab.

## 2013-11-02 NOTE — CV Procedure (Signed)
Procedure performed:  Left heart catheterization including hemodynamic monitoring of the left ventricle, LV gram, selective right and left coronary arteriography.  Indication patient is a 68 year-old Caucasian male with no significant prior cardiovascular history  who presents with non-ST elevation myocardial infarction with positive cardiac markers and dynamic EKG changes earlier this morning, also had TIA-like symptoms, due to ongoing chest discomfort he was brought emergently to the cardiac catheterization lab to evaluate his coronary anatomy. His physical examination was abnormal history of moderately severe aortic regurgitation along with aortic stenosis.  Hemodynamic data:  Left ventricular pressure was 190/30 with LVEDP of 40 mm mercury. Aortic pressure was 122/71 with a mean of 96 mm mercury. There was 69 mm Hg peak-peak, mean gradient of 47 mmHg, consistent with severe aortic stenosis. The gradient probably worse due to presence of aortic regurgitation.  Ascending aortogram: The aortic valve was moderate to severely calcified, appeared to be bicuspid, there is  severe aortic regurgitation.  Left ventricle: Performed in the RAO projection revealed LVEF of 60%. Marked LVH. There was No significant MR. No wall motion abnormality.  Right coronary artery: The vessel is smooth, normal,  Dominant.  Left main coronary artery is large and normal.  Circumflex coronary artery: A large vessel giving origin to a large obtuse marginal 1. It is smooth and normal.   LAD:  LAD gives origin to a large sized D1, moderate D2 and a moderate size D3. LAD is a large-caliber vessel. There was no significant luminal obstruction. Minimal calcification is evident in the proximal segment.   Impression: Suspect TIA-like symptoms could be related to degenerated and calcified aortic valve, patient has probably severe aortic regurgitation and moderate aortic stenosis, will need TEE to further delineate his valve,  he'll be observed for recurrence of TIA-like symptoms and also his cardiac markers will be trended.  Technique: Under sterile precautions using a 6 French right radial  arterial access, a 6 French sheath was introduced into the right radial artery. A 5 [place] Tig 4 catheter was advanced into the ascending aorta selective  right coronary artery and left coronary artery was cannulated and angiography was performed in multiple views. The catheter was pulled back Out of the body over exchange length J-wire. I attempted to cross the aortic valve, utilization of a 6 French AL-1, I was able to cross the valve. I then exchanges to the pigtail 5 French catheter, to perform LV gram which was performed in RAO projection. The pigtail catheter was also utilized to perform ascending aortogram in the LAO projection. Catheter exchanged out of the body over J-Wire. NO immediate complications noted. Patient tolerated the procedure well. A total of 135 cc of contrast was utilized for diagnostic angiography.

## 2013-11-02 NOTE — Care Management Note (Signed)
    Page 1 of 1   11/02/2013     10:33:21 AM CARE MANAGEMENT NOTE 11/02/2013  Patient:  Willie Freeman,Willie Freeman   Account Number:  [id number]  Date Initiated:  11/02/2013  Documentation initiated by:  Elissa Hefty  Subjective/Objective Assessment:   adm w [facility], pos troponins     Action/Plan:   lives w wife   Anticipated DC Date:     Anticipated DC Plan:           Choice offered to / List presented to:             Status of service:   Medicare Important Message given?   (If response is "NO", the following Medicare IM given date fields will be blank) Date Medicare IM given:   Date Additional Medicare IM given:    Discharge Disposition:    Per UR Regulation:  Reviewed for med. necessity/level of care/duration of stay  If discussed at [place] of Stay Meetings, dates discussed:    Comments:

## 2013-11-02 NOTE — Progress Notes (Signed)
VASCULAR LAB PRELIMINARY  PRELIMINARY  PRELIMINARY  PRELIMINARY  Carotid duplex  completed.    Preliminary report:  Bilateral:  1-39% ICA stenosis.  Vertebral artery flow is antegrade.      Nani Ravens, RVT 11/02/2013, 4:57 PM

## 2013-11-03 LAB — LIPID PANEL
Cholesterol: 157 mg/dL (ref 0–200)
HDL: 37 mg/dL — ABNORMAL LOW (ref >39)
LDL Cholesterol: 97 mg/dL (ref 0–99)
Total CHOL/HDL Ratio: 4.2 RATIO
Triglycerides: 113 mg/dL (ref <150)
VLDL: 23 mg/dL (ref 0–40)

## 2013-11-03 LAB — CBC
HCT: 36.5 % — ABNORMAL LOW (ref 39.0–52.0)
Hemoglobin: 12.2 g/dL — ABNORMAL LOW (ref 13.0–17.0)
MCH: 29.6 pg (ref 26.0–34.0)
MCHC: 33.4 g/dL (ref 30.0–36.0)
MCV: 88.6 fL (ref 78.0–100.0)
PLATELETS: 129 10*3/uL — AB (ref 150–400)
RBC: 4.12 MIL/uL — ABNORMAL LOW (ref 4.22–5.81)
RDW: 14.6 % (ref 11.5–15.5)
WBC: 6.7 10*3/uL (ref 4.0–10.5)

## 2013-11-03 LAB — TROPONIN I: TROPONIN I: 2.47 ng/mL — AB (ref <0.30)

## 2013-11-03 MED ORDER — FENTANYL CITRATE 0.05 MG/ML IJ SOLN: 25.0000 ug | Freq: Once | INTRAMUSCULAR | Status: AC

## 2013-11-03 MED ORDER — SODIUM CHLORIDE 0.9 % IV SOLN: INTRAVENOUS | Status: DC

## 2013-11-03 MED ORDER — MIDAZOLAM HCL 2 MG/2ML IJ SOLN: 2.0000 mg | Freq: Once | INTRAMUSCULAR | Status: AC

## 2013-11-03 MED ORDER — MIDAZOLAM HCL 2 MG/2ML IJ SOLN
INTRAMUSCULAR | Status: AC
  Administered 2013-11-03: 2 mg via INTRAVENOUS
  Filled 2013-11-03: qty 2

## 2013-11-03 MED ORDER — FENTANYL CITRATE 0.05 MG/ML IJ SOLN
INTRAMUSCULAR | Status: AC
  Administered 2013-11-03: 25 ug via INTRAVENOUS
  Filled 2013-11-03: qty 2

## 2013-11-03 NOTE — Progress Notes (Signed)
OT Cancellation Note  Patient Details Name: Willie Freeman MRN: [id number] DOB: 1945-08-03   Cancelled Treatment:    Reason Eval/Treat Not Completed: OT screened, no needs identified, will sign off. Pt is performing ADL independently as well as mobility.  [name] [name] 11/03/2013, 2:11 PM

## 2013-11-03 NOTE — Progress Notes (Signed)
  Echocardiogram Echocardiogram Transesophageal has been performed.  Willie Freeman 11/03/2013, 9:10 AM

## 2013-11-03 NOTE — Progress Notes (Signed)
Stroke Team Progress Note  HISTORY Willie Freeman is an 68 y.o. male with no relevant past medical history, brought in as a code stroke due to acute onset of the above stated symptoms.  He was initially evaluated at [facility] and transferred to [facility].  Patient stated that around 11 pm last night he developed a warmness sensation in his chest followed by HA and chest pressure. This took him to [facility] emergency department. He was initially assessed by the ER physician when he developed acute confusion and because there CT scan it was not working he was transferred emergently to the [facility] emergency department.  According to patient and wife, at the time of initial ED evaluation at [facility] there was some weakness of the right side but they denied slurred speech, face weakness, imbalance, focal numbness, vertigo, double vision, language or vision disturbances.  Wife said that for approximately 45 minutes her husband was " confused and with slow thinking process".  NIHSS 1 on immediate presentation to [facility] but rapidly NIHSS 0  CT brain showed no acute abnormality.  EKG showed no atrial fibrillation but nonspecific anterior ST changes with an abnormal ST morphology without criteria for ST elevation MI at that time.  Still complaining of chest pressure.  Date last known well: 11/02/11  Time last known well: 10 pm  tPA Given: no, symptoms resolved  NIHSS: 0  MRS: 0     SUBJECTIVE His daughters x 2 are at the bedside.  Overall he feels his condition is resolved.  No new complaints. 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 sodium chloride      MEDICATIONS  . aspirin EC  81 mg Oral Daily  . atorvastatin  80 mg Oral q1800  . heparin  5,000 Units Subcutaneous 3 times per day  . metoprolol tartrate  12.5 mg Oral BID  . sodium chloride  3 mL Intravenous Q12H   PRN:  sodium chloride, nitroGLYCERIN, ondansetron (ZOFRAN) IV, sodium chloride  Diet:  NPO   Activity:  Bedrest  DVT Prophylaxis:  SCDs CLINICALLY SIGNIFICANT STUDIES Basic Metabolic Panel:   Recent Labs Lab 11/02/13 0335 11/02/13 0344  NA 144 143  K 4.2 3.9  CL 107 107  CO2 26  --   GLUCOSE 120* 119*  BUN 19 18  CREATININE 0.78 0.90  CALCIUM 8.7  --    Liver Function Tests:   Recent Labs Lab 11/02/13 0335  AST 18  ALT 16  ALKPHOS 83  BILITOT 0.3  PROT 6.2  ALBUMIN 3.4*   CBC:   Recent Labs Lab 11/02/13 0335 11/02/13 0344 11/03/13 0331  WBC 5.9  --  6.7  NEUTROABS 4.4  --   --   HGB 12.7* 12.9* 12.2*  HCT 38.5* 38.0* 36.5*  MCV 88.3  --  88.6  PLT 136*  --  129*   Coagulation:   Recent Labs Lab 11/02/13  0335  LABPROT 12.6  INR 0.96   Cardiac Enzymes:   Recent Labs Lab 11/02/13 1305 11/02/13 2015 11/03/13 0902  TROPONINI 5.84* 6.96* 2.47*   Urinalysis: No results found for this basename: COLORURINE, APPERANCEUR, LABSPEC, PHURINE, GLUCOSEU, HGBUR, BILIRUBINUR, KETONESUR, PROTEINUR, UROBILINOGEN, NITRITE, LEUKOCYTESUR,  in the last 168 hours Lipid Panel    Component Value Date/Time   CHOL 157 11/03/2013 0331   HgbA1C  Lab Results  Component Value Date   HGBA1C 5.6 11/02/2013    Urine Drug Screen:   No results found for this basename: labopia,  cocainscrnur,  labbenz,  amphetmu,  thcu,  labbarb    Alcohol Level:   Recent Labs Lab 11/02/13 0335  ETH <11    Ct Head Wo Contrast  11/02/2013   CLINICAL DATA:  Confusion and right arm weakness.  EXAM: CT HEAD WITHOUT CONTRAST  TECHNIQUE: Contiguous axial images were obtained from the  base of the skull through the vertex without intravenous contrast.  COMPARISON:  None.  FINDINGS: Mild diffuse cerebral atrophy. No ventricular dilatation. Old appearing lacune or infarct in the left posterior parietal white matter. No mass effect or midline shift. No abnormal extra-axial fluid collections. Gray-white matter junctions are distinct. Basal cisterns are not effaced. No evidence of acute intracranial hemorrhage. No depressed skull fractures. Visualized paranasal sinuses and mastoid air cells are not opacified.  IMPRESSION: No acute intracranial abnormality.  Mild diffuse cerebral atrophy.  Code stroke results called to Dr. Venora Freeman at [place] hr on 11/02/2013.   Electronically Signed   By: Lucienne Capers M.D.   On: 11/02/2013 03:11   Mr Willie Freeman Head Wo Contrast  11/02/2013   CLINICAL DATA:  Sudden onset of confusion and right-sided weakness.  EXAM: MRI HEAD WITHOUT CONTRAST  MRA HEAD WITHOUT CONTRAST  TECHNIQUE: Multiplanar, multiecho pulse sequences of the brain and surrounding structures were obtained without intravenous contrast. Angiographic images of the head were obtained using MRA technique without contrast.  COMPARISON:  CT head without contrast from the same day  FINDINGS: MRI HEAD FINDINGS  The diffusion-weighted images demonstrate a punctate acute nonhemorrhagic infarct involving the high right frontal lobe. This is best seen on image 26 of series 3. Remote lacunar infarcts are present in the cerebellum bilaterally as well as the bilateral caudate head. No hemorrhage or mass lesion is present. There is no significant white matter disease otherwise.  Flow is present in the major intracranial arteries. The globes and orbits are intact. The paranasal sinuses and mastoid air cells are clear.  MRA HEAD FINDINGS  The internal carotid arteries are within normal limits from the high cervical segments through the ICA termini bilaterally. The A1 and M1 segments are normal. The anterior communicating artery  is patent. The MCA bifurcations are intact. The ACA and MCA branch vessels are unremarkable.  The left vertebral artery is the dominant vessel. The PICA origins are visualized and normal bilaterally. The basilar artery is normal. The right posterior cerebral artery originates from the basilar tip. The left posterior cerebral artery is of fetal type. The PCA branch vessels are within normal limits.  IMPRESSION: 1. Acute punctate nonhemorrhagic infarct within the the high right frontal lobe cortex. 2. Remote lacunar infarcts within the caudate head and cerebellum bilaterally. These results were called by telephone at the time of interpretation on 11/02/2013 at 7:20 PM to Dr. Wallie Char, who verbally acknowledged these results.   Electronically Signed   By: Lawrence Santiago M.D.   On: 11/02/2013 19:22  Mr Brain Wo Contrast  11/02/2013   CLINICAL DATA:  Sudden onset of confusion and right-sided weakness.  EXAM: MRI HEAD WITHOUT CONTRAST  MRA HEAD WITHOUT CONTRAST  TECHNIQUE: Multiplanar, multiecho pulse sequences of the brain and surrounding structures were obtained without intravenous contrast. Angiographic images of the head were obtained using MRA technique without contrast.  COMPARISON:  CT head without contrast from the same day  FINDINGS: MRI HEAD FINDINGS  The diffusion-weighted images demonstrate a punctate acute nonhemorrhagic infarct involving the high right frontal lobe. This is best seen on image 26 of series 3. Remote lacunar infarcts are present in the cerebellum bilaterally as well as the bilateral caudate head. No hemorrhage or mass lesion is present. There is no significant white matter disease otherwise.  Flow is present in the major intracranial arteries. The globes and orbits are intact. The paranasal sinuses and mastoid air cells are clear.  MRA HEAD FINDINGS  The internal carotid arteries are within normal limits from the high cervical segments through the ICA termini bilaterally. The A1 and  M1 segments are normal. The anterior communicating artery is patent. The MCA bifurcations are intact. The ACA and MCA branch vessels are unremarkable.  The left vertebral artery is the dominant vessel. The PICA origins are visualized and normal bilaterally. The basilar artery is normal. The right posterior cerebral artery originates from the basilar tip. The left posterior cerebral artery is of fetal type. The PCA branch vessels are within normal limits.  IMPRESSION: 1. Acute punctate nonhemorrhagic infarct within the the high right frontal lobe cortex. 2. Remote lacunar infarcts within the caudate head and cerebellum bilaterally. These results were called by telephone at the time of interpretation on 11/02/2013 at 7:20 PM to Dr. Wallie Char, who verbally acknowledged these results.   Electronically Signed   By: Lawrence Santiago M.D.   On: 11/02/2013 19:22   Dg Chest Portable 1 View  11/02/2013   CLINICAL DATA:  Chest pain, code stroke.  EXAM: PORTABLE CHEST - 1 VIEW  COMPARISON:  DG CHEST 1V PORT dated 11/02/2013 at 0213 hr  FINDINGS: Cardiac silhouette appears mild to moderately enlarged, similar. No pleural effusions or focal consolidations. No pneumothorax. Soft tissue planes and included osseous structures are nonsuspicious. Mild degenerative change of thoracic spine. Multiple EKG lines overlie the patient and may obscure subtle underlying pathology.  IMPRESSION: Stable cardiomegaly, no acute pulmonary process.   Electronically Signed   By: Elon Alas   On: 11/02/2013 03:44       MRI of the brain  pending  MRA of the brain  pending  2D Echocardiogram  Not ordered  Carotid Doppler  pending  CXR    EKG  Nsr. LVH Therapy Recommendations pending Physical Exam   Pleasant middle aged caucasian male not in distress.Awake alert. Afebrile. Head is nontraumatic. Neck is supple without bruit. Hearing is normal. Cardiac exam no murmur or gallop. Lungs are clear to auscultation. Distal pulses are  well felt. Neurological Exam :    Awake  Alert oriented x 3.Diminished recall 2/3. Normal speech and language.eye movements full without nystagmus.fundi were not visualized. Vision acuity and fields appear normal. Hearing is normal. Palatal movements are normal. Face symmetric. Tongue midline. Normal strength, tone, reflexes and coordination. Normal sensation. Gait deferred. ASSESSMENT Mr. Willie Freeman is a 68 y.o. male presenting with transient confusion, memory loss and RUE weakness from bilateral tiny embolic infarcts -source aortic stenosis possible versus paroxysmal AFIB.  On aspirin 81 mg orally every day  prior to admission. Now on aspirin 81 mg orally every day for secondary stroke prevention. Patient with resultant  No deficits. Stroke work up underway.      LDL pending      Hospital day # 1  TREATMENT/PLAN  Continue   aspirin 81 mg orally every day for secondary stroke prevention.  Mobilize out of bed, Pt/OT consults  Check brain MRI, dopplers, lipids  TEE today per Dr Einar Gip to eval aortic stenosis  DC home likley later today. Outpatient CVTS f/u for AVR replacement. F/U with me in 2 months  D/w Dr Einar Gip and family  Antony Contras, MD 11/03/2013 11:01 AM  I   To contact Stroke Continuity provider, please refer to [email or web address] After hours, contact General Neurology

## 2013-11-03 NOTE — Progress Notes (Addendum)
Subjective:  Patient feels well, no specific complaints. TEE planned at bedside, patient aware of all the risks, benefits and history  Objective:  Vital Signs in the last 24 hours: Temp:  [97.7 F (36.5 C)-98.3 F (36.8 C)] 97.7 F (36.5 C) (05/14 0348) Pulse Rate:  [56-62] 56 (05/14 0348) Resp:  [12-17] 14 (05/13 2342) BP: (134-183)/(57-111) 151/69 mmHg (05/14 0348) SpO2:  [96 %-99 %] 96 % (05/14 0348)  Intake/Output from previous day: 05/13 0701 - 05/14 0700 In: 1224.3 [P.O.:480; I.V.:744.3] Out: 650 [Urine:650]  Physical Exam:  General appearance: alert, cooperative, appears stated age, no distress and He appears to and has marfanoid appearance.  Eyes: negative findings: lids and lashes normal and conjunctivae and sclerae normal  Neck: no adenopathy, no JVD, supple, symmetrical, trachea midline and thyroid not enlarged, symmetric, no tenderness/mass/nodules  Neck: JVP - normal, carotids 2+= without bruits  Resp: clear to auscultation bilaterally  Chest wall: no tenderness  Cardio: S1, S2 normal and There is a blowing ejection systolic murmur 3/6, and along early diastolic murmur of grade 3/4 intensity heard in the right sternal border. Also heard in the apex. Conducted to the carotids.  GI: soft, non-tender; bowel sounds normal; no masses, no organomegaly  Extremities: extremities normal, atraumatic, no cyanosis or edema  Pulses: 2+ and symmetric  Lab Results: BMP  Recent Labs  11/02/13 0335 11/02/13 0344  NA 144 143  K 4.2 3.9  CL 107 107  CO2 26  --   GLUCOSE 120* 119*  BUN 19 18  CREATININE 0.78 0.90  CALCIUM 8.7  --   GFRNONAA >90  --   GFRAA >90  --     CBC  Recent Labs Lab 11/02/13 0335  11/03/13 0331  WBC 5.9  --  6.7  RBC 4.36  --  4.12*  HGB 12.7*  < > 12.2*  HCT 38.5*  < > 36.5*  PLT 136*  --  129*  MCV 88.3  --  88.6  MCH 29.1  --  29.6  MCHC 33.0  --  33.4  RDW 14.3  --  14.6  LYMPHSABS 1.0  --   --   MONOABS 0.4  --   --   EOSABS 0.1   --   --   BASOSABS 0.0  --   --   < > = values in this interval not displayed.  HEMOGLOBIN A1C Lab Results  Component Value Date   HGBA1C 5.6 11/02/2013   MPG 114 11/02/2013    Cardiac Panel (last 3 results)  Recent Labs  11/02/13 0737 11/02/13 1305 11/02/13 2015  TROPONINI 2.98* 5.84* 6.96*    CHOLESTEROL  Recent Labs  11/03/13 0331  CHOL 157    Hepatic Function Panel  Recent Labs  11/02/13 0335  PROT 6.2  ALBUMIN 3.4*  AST 18  ALT 16  ALKPHOS 83  BILITOT 0.3    Imaging:  MRI results reviewed. EKG: Normal sinus rhythm, LVH with repolarization abnormality.   Assessment/Plan:  1. 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 2. Severe aortic regurgitation, at least moderate aortic stenosis. 3. Punctate acute small frontal lobe infarct, old caudate nucleus left white matter infarcts, I suspect this could be related to embolic infarcts from aortic valve disease.  Recommendation: Planned on performing TEE today. Patient to bring on the severity of aortic regurgitation and aortic stenosis, may need valve replacement. Continue high-dose statins for now to decrease cardioembolic phenomena, continue beta blocker for  now along with aspirin. Followup on serum troponin and repeat EKG.  TEE: Marked LVH. Severe AI and AS. Severely degenerated bicuspid aortic valve. No other significant valve disease. Normal LVEF.  Laverda Page, M.D. 11/03/2013, 8:20 AM [facility] Pager: [phone] Office: [phone] If no answer: [phone]

## 2013-11-04 ENCOUNTER — Encounter: Payer: Self-pay | Admitting: Surgery

## 2013-11-04 ENCOUNTER — Other Ambulatory Visit: Payer: Self-pay | Admitting: *Deleted

## 2013-11-04 DIAGNOSIS — I359 Nonrheumatic aortic valve disorder, unspecified: Secondary | ICD-10-CM

## 2013-11-04 DIAGNOSIS — I2510 Atherosclerotic heart disease of native coronary artery without angina pectoris: Secondary | ICD-10-CM

## 2013-11-04 LAB — CBC
HCT: 37.9 % — ABNORMAL LOW (ref 39.0–52.0)
Hemoglobin: 12.5 g/dL — ABNORMAL LOW (ref 13.0–17.0)
MCH: 29.6 pg (ref 26.0–34.0)
MCHC: 33 g/dL (ref 30.0–36.0)
MCV: 89.8 fL (ref 78.0–100.0)
Platelets: 130 10*3/uL — ABNORMAL LOW (ref 150–400)
RBC: 4.22 MIL/uL (ref 4.22–5.81)
RDW: 14.6 % (ref 11.5–15.5)
WBC: 4.6 10*3/uL (ref 4.0–10.5)

## 2013-11-04 MED ORDER — ASPIRIN 325 MG PO TBEC: 81.0000 mg | DELAYED_RELEASE_TABLET | Freq: Every day | ORAL | Status: DC

## 2013-11-04 MED ORDER — METOPROLOL TARTRATE 25 MG PO TABS: 12.5000 mg | ORAL_TABLET | Freq: Two times a day (BID) | ORAL | Status: DC

## 2013-11-04 MED ORDER — ATORVASTATIN CALCIUM 80 MG PO TABS: 80.0000 mg | ORAL_TABLET | Freq: Every day | ORAL | Status: DC

## 2013-11-04 NOTE — Progress Notes (Unsigned)
Patient ID: Willie Freeman, male   DOB: [birth year], 68 y.o.   MRN: [id number]       [address]       [place] [address]             [phone]     Cardiothoracic Surgery History and Physical:                       [facility] Medical Record #[id number] Date of Birth: April 07, 1946  Primary Cardiologist: Dr. Clayton Lefort Referring physician: Dr. 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   History of Present Illness:      The patient is a 68 year old gentleman in previously good health who does not go to a physician routinely and has not been in many years. He awoke on the evening of 11/01/2013 with chest pressure, feeling warm, and having a headache and was taken to [facility] where he was evaluated for an acute coronary syndrome with nonspecific anterior ST changes. He was noted to have confusion and right sided weakness. He was transferred to [facility] as a code stroke and was evaluated by neurology. He was felt to have a left brain TIA. He continued to have chest discomfort off and on with subtle dynamic ECG changes. He had a positive troponin of 0.57 that peaked at 6.96. He had a brain MRI that showed an acut punctate nonhemorrhagic infarct in the high right frontal cortex as well as remote lacunar infarcts within the caudate head and cerebellum bilaterally. A TEE showed normal LVEF of 55-60% with no wall motion abnormalities. The aortic valve is bicuspid and severely calcified with moderate to severe AI. The mean gradient is 44 and the peak is 66 with an AVA of 0.86. He is now back to normal with resolution of his neurologic and chest pain symptoms.   Current Activity/ Functional Status: Patient is independent with mobility/ambulation, transfers, ADL's, IADL's.   Past Medical History  Diagnosis Date  . Back pain     Past Surgical History  Procedure Laterality Date  . Hernia  repair      History  Smoking status  . Former Smoker  Smokeless tobacco  . Not on file    History  Alcohol Use  . Yes    Comment: Rarely    History   Social History  . Marital Status: Married    Spouse Name: N/A    Number of Children: N/A  . Years of Education: N/A   Occupational History  . Not on file.   Social History Main Topics  . Smoking status: Former Research scientist (life sciences)  . Smokeless tobacco: Not on file  . Alcohol Use: Yes     Comment: Rarely  . Drug Use: Not on file  . Sexual Activity: Not on file   Other Topics Concern  . Not on file   Social History Narrative  . No narrative on file    No Known Allergies  Current Outpatient Prescriptions  Medication Sig Dispense Refill  . aspirin EC 325 MG EC tablet Take 1 tablet (325 mg total) by mouth daily.      Marland Kitchen atorvastatin (LIPITOR) 80 MG tablet Take 1 tablet (80 mg total) by mouth daily at 6 PM.  30 tablet  1  . finasteride (PROSCAR) 5 MG tablet Take 5 mg by mouth daily.       . metoprolol  tartrate (LOPRESSOR) 25 MG tablet Take 0.5 tablets (12.5 mg total) by mouth 2 (two) times daily.  60 tablet  1  . tamsulosin (FLOMAX) 0.4 MG CAPS capsule Take 0.4 mg by mouth daily.       . VESICARE 5 MG tablet Take 5 mg by mouth daily.        No current facility-administered medications for this visit.   Facility-Administered Medications Ordered in Other Visits  Medication Dose Route Frequency Provider Last Rate Last Dose  . 0.9 %  sodium chloride infusion  250 mL Intravenous PRN Laverda Page, MD   250 mL at 11/02/13 0700  . aspirin EC tablet 81 mg  81 mg Oral Daily Dorian Pod, MD   81 mg at 11/04/13 1036  . atorvastatin (LIPITOR) tablet 80 mg  80 mg Oral q1800 Laverda Page, MD   80 mg at 11/04/13 1800  . heparin injection 5,000 Units  5,000 Units Subcutaneous 3 times per day Laverda Page, MD   5,000 Units at 11/04/13 1551  . metoprolol tartrate (LOPRESSOR) tablet 12.5 mg  12.5 mg Oral BID Laverda Page, MD    12.5 mg at 11/04/13 1036  . nitroGLYCERIN (NITROSTAT) SL tablet 0.4 mg  0.4 mg Sublingual Q5 min PRN Hoy Morn, MD   0.4 mg at 11/02/13 0405  . ondansetron (ZOFRAN) injection 4 mg  4 mg Intravenous Q6H PRN Laverda Page, MD      . sodium chloride 0.9 % injection 3 mL  3 mL Intravenous Q12H Laverda Page, MD   3 mL at 11/04/13 1036  . sodium chloride 0.9 % injection 3 mL  3 mL Intravenous PRN Laverda Page, MD         Family Hx:  Strong family history of heart disease but he does not know specifics. No definite history of aortic valve or aortic aneurysm disease   Review of Systems:     Cardiac Review of Systems: Y or N  Chest Pain [  y  ]  Resting SOB [n   ] Exertional SOB  [ n ]  Orthopnea [n ]   Pedal Edema Florencio.Farrier  ]    Palpitations [ n ] Syncope  [n  ]  Presyncope [n ]   General Review of Systems: [Y] = yes [  ]=no  Constitional: recent weight change [n]; anorexia [ n ]; fatigue [ y]; nausea [n ]; night sweats [n  ]; fever [n  ]; or chills [ n ];                                                                                                                                          Dental: poor dentition[n  ];    Eye : blurred vision [n ]; diplopia Florencio.Farrier ]; vision changes [ n ];  Amaurosis fugax[ n];  Resp: cough [n  ];  wheezing[ n ];  hemoptysis[ n ]; shortness of breath[ n ]; paroxysmal nocturnal dyspnea[ n ]; dyspnea on exertion[ n; or orthopnea[ n ];   GI:  gallstones[ n], vomiting[ n ];  dysphagia[n]; melena[ n ];  hematochezia [n ]; heartburn[ n ];   Hx of  Colonoscopy[  n];  GU: kidney stones [n  ]; hematuria[n  ];   dysuria [n ];  nocturia[  n];  history of     obstruction [n  ];              Skin: rash, swelling[ n ];, hair loss[n];  peripheral edema[n];  or itching[ n ];  Musculosketetal: myalgias[ n ];  joint swelling[ n ];  joint erythema[ n ];  joint pain[ n ];  back pain[ n ];   Heme/Lymph: bruising[ n ];  bleeding[ n;  anemia[ n ];   Neuro: TIA[ n ];   headaches[  y];  stroke[  y];  vertigo[ n ];  seizures[ n ];   paresthesias[  n];  difficulty walking[n  ];   Psych:depression[ n]; anxiety[ n ];   Endocrine: diabetes[  n  thyroid dysfunction[ n ]   Immunizations: Flu [ n ]; Pneumococcal[ n ];   Other:  Physical Exam: There were no vitals taken for this visit.   General appearance: alert and cooperative HEENT: [place]/AT, EOMI , oropharynx clear Neck: carotid pulses with slow upstroke. Transmitted murmur to both sides of neck Cardiac: RRR, 3/6 systolic murmur at RSB with 2/6 diastolic murmur at RSB radiating to apex Lungs: clear Abdomen: bowel sounds normal, nontender, no masses or organomegaly Ext: no edema, pedal pulses palpable. Neuro: Alert and oriented x 3 , motor and sensory intact   Diagnostic Studies & Laboratory data:    *[facility]* [address] [place], [place] [address] [phone]  ------------------------------------------------------------ Transesophageal Echocardiography  Patient: Danton, Gahan MR #: [id number] Study Date: 11/03/2013 Gender: M Age: 33 Height: 180.3cm Weight: 77.7kg BSA: 1.63m^2 Pt. Status: Room: [place]  PERFORMING Adrian Prows SONOGRAPHER Tresa Res, RDCS ADMITTING Kela Millin ATTENDING Kela Millin Deatra Robinson, Cammy Brochure Kingsley Callander cc:  ------------------------------------------------------------ LV EF: 55% - 60%  ------------------------------------------------------------ Indications: Aortic stenosis /insufficiency 424.1.  ------------------------------------------------------------ Study Conclusions  - Left ventricle: The cavity size was normal. Wall thickness was increased in a pattern of severe LVH. There was concentric hypertrophy. The estimated ejection fraction was in the range of 55% to 60%. Wall motion was normal; there were no regional wall motion abnormalities. - Aortic valve: Normal-sized, mildly to  moderately calcified annulus. Bicuspid; severely thickened, severely calcified leaflets; fusion of the right-noncoronary commissure. Transvalvular velocity was increased. There was severe stenosis. Moderate to severe regurgitation directed eccentrically in the LVOT. Mean gradient: 30mm Hg (S). Peak gradient: 59mm Hg (S). Valve area: 0.86cm^2(VTI). Valve area: 1.04cm^2 (Vmax). Regurgitation pressure half-time:224ms. - Mitral valve: Mildly calcified annulus. - Left atrium: The atrium was moderately dilated. No evidence of thrombus in the atrial cavity or appendage. - Right atrium: No evidence of thrombus in the atrial cavity or appendage. - Atrial septum: No defect or patent foramen ovale was identified. Echo contrast study showed no right-to-left atrial level shunt, following an increase in RA pressure induced by provocative maneuvers. - Tricuspid valve: No evidence of vegetation. - Pulmonic valve: No evidence of vegetation. Transesophageal echocardiography. 2D and color Doppler. Height: Height: 180.3cm. Height: 71in. Weight: Weight: 77.7kg. Weight: 171lb. Body mass index: BMI: 23.9kg/m^2. Body surface area: BSA: 1.64m^2. Blood pressure: 188/76.  Patient status: Inpatient. Location: Bedside.  ------------------------------------------------------------  ------------------------------------------------------------ Left ventricle: The cavity size was normal. Wall thickness was increased in a pattern of severe LVH. There was concentric hypertrophy. The estimated ejection fraction was in the range of 55% to 60%. Wall motion was normal; there were no regional wall motion abnormalities.  ------------------------------------------------------------ Aortic valve: Well visualized. Normal-sized, mildly to moderately calcified annulus. Bicuspid; severely thickened, severely calcified leaflets; fusion of the right-noncoronary commissure. Doppler: Transvalvular velocity was increased. There  was severe stenosis. Moderate to severe regurgitation directed eccentrically in the LVOT. VTI ratio of LVOT to aortic valve: 0.17. Valve area: 0.86cm^2(VTI). Indexed valve area: 0.43cm^2/m^2 (VTI). Peak velocity ratio of LVOT to aortic valve: 0.21. Valve area: 1.04cm^2 (Vmax). Indexed valve area: 0.53cm^2/m^2 (Vmax). Mean gradient: 38mm Hg (S). Peak gradient: 44mm Hg (S).  ------------------------------------------------------------ Aorta: The aorta was not dilated and non-calcified. Mild fibrocalcific change of the root was noted.  ------------------------------------------------------------ Mitral valve: Mildly calcified annulus. Doppler: Trivial regurgitation.  ------------------------------------------------------------ Left atrium: The atrium was moderately dilated. No evidence of thrombus in the atrial cavity or appendage. The appendage was well visualized and morphologically a left appendage. Emptying velocity was normal.  ------------------------------------------------------------ Atrial septum: No defect or patent foramen ovale was identified. Echo contrast study showed no right-to-left atrial level shunt, following an increase in RA pressure induced by provocative maneuvers.  ------------------------------------------------------------ Right ventricle: The cavity size was normal. Wall thickness was normal. Systolic function was normal.  ------------------------------------------------------------ Pulmonic valve: Structurally normal valve. Cusp separation was normal. No evidence of vegetation. Doppler: Trivial regurgitation.  ------------------------------------------------------------ Tricuspid valve: Structurally normal valve. Leaflet separation was normal. No evidence of vegetation. Doppler: Trivial regurgitation.  ------------------------------------------------------------ Pulmonary artery: The main pulmonary artery  was normal-sized.  ------------------------------------------------------------ Right atrium: The atrium was normal in size. No evidence of thrombus in the atrial cavity or appendage.  ------------------------------------------------------------ Pericardium: The pericardium was normal in appearance. There was no pericardial effusion.  ------------------------------------------------------------ Post procedure conclusions Ascending Aorta:  - The aorta was not dilated and non-calcified.  ------------------------------------------------------------  2D measurements Normal Doppler measurements Norma LVOT l Diam, S 25 mm ------ LVOT Area 4.91 cm^2 ------ Peak vel, 86. cm/s ----- S 2 VTI, S 16. cm ----- 8 Aortic valve Peak vel, 407 cm/s ----- S Mean vel, 311 cm/s ----- S VTI, S 96. cm ----- 4 Mean 44 mm Hg ----- gradient, S Peak 66 mm Hg ----- gradient, S VTI ratio 0.1 ----- LVOT/AV 7 Area, VTI 0.8 cm^2 ----- 6 Area 0.4 cm^2/m^2 ----- index 3 (VTI) Peak vel 0.2 ----- ratio, 1 LVOT/AV Area, 1.0 cm^2 ----- Vmax 4 Area 0.5 cm^2/m^2 ----- index 3 (Vmax) Regurg 701 ms ----- PHT  ------------------------------------------------------------ Prepared and Electronically Authenticated by  Adrian Prows 2015-05-15T08:[id number]   Left heart catheterization including hemodynamic monitoring of the left ventricle, LV gram, selective right and left coronary arteriography.  Indication patient is a 68 year-old Caucasian male with no significant prior cardiovascular history who presents with non-ST elevation myocardial infarction with positive cardiac markers and dynamic EKG changes earlier this morning, also had TIA-like symptoms, due to ongoing chest discomfort he was brought emergently to the cardiac catheterization lab to evaluate his coronary anatomy. His physical examination was abnormal history of moderately severe aortic regurgitation along with aortic stenosis.   Hemodynamic data:  Left ventricular pressure was 190/30 with LVEDP of 40 mm mercury. Aortic pressure was 122/71 with a mean of 96 mm mercury. There was 69 mm Hg peak-peak, mean gradient of 47 mmHg, consistent with severe  aortic stenosis. The gradient probably worse due to presence of aortic regurgitation.  Ascending aortogram: The aortic valve was moderate to severely calcified, appeared to be bicuspid, there is severe aortic regurgitation.  Left ventricle: Performed in the RAO projection revealed LVEF of 60%. Marked LVH. There was No significant MR. No wall motion abnormality.  Right coronary artery: The vessel is smooth, normal, Dominant.  Left main coronary artery is large and normal.  Circumflex coronary artery: A large vessel giving origin to a large obtuse marginal 1. It is smooth and normal.  LAD: LAD gives origin to a large sized D1, moderate D2 and a moderate size D3. LAD is a large-caliber vessel. There was no significant luminal obstruction. Minimal calcification is evident in the proximal segment.  Impression: Suspect TIA-like symptoms could be related to degenerated and calcified aortic valve, patient has probably severe aortic regurgitation and moderate aortic stenosis, will need TEE to further delineate his valve, he'll be observed for recurrence of TIA-like symptoms and also his cardiac markers will be trended.     Recent Radiology Findings:   No results found.    Recent Lab Findings: Lab Results  Component Value Date   WBC 4.6 11/04/2013   HGB 12.5* 11/04/2013   HCT 37.9* 11/04/2013   PLT 130* 11/04/2013   GLUCOSE 119* 11/02/2013   CHOL 157 11/03/2013   TRIG 113 11/03/2013   HDL 37* 11/03/2013   LDLCALC 97 11/03/2013   ALT 16 11/02/2013   AST 18 11/02/2013   NA 143 11/02/2013   K 3.9 11/02/2013   CL 107 11/02/2013   CREATININE 0.90 11/02/2013   BUN 18 11/02/2013   CO2 26 11/02/2013   INR 0.96 11/02/2013   HGBA1C 5.6 11/02/2013      Assessment / Plan:      He has severe  bicuspid aortic valve stenosis with moderate to severe AI presenting with an acute stroke and evidence of myocardial ischemia with markedly positive troponin. I suspect this is all embolic due to debri from the aortic valve. I agree with the need for AVR. He is stable at this time with complete resolution of his neurologic symptoms and chest pain. I discussed the operative procedure with the patient and family including alternatives, benefits and risks; including but not limited to bleeding, blood transfusion, infection, stroke, myocardial infarction, heart block requiring a permanent pacemaker, organ dysfunction, and death.  Lindie Spruce understands and agrees to proceed.  We will schedule surgery for Tuesday 11/15/2013. I discussed the pros and cons of mechanical and tissue valves with the patient and his family. I would recommend using a tissue valve at his age and the current ACC guidelines recommend a tissue valve for patients 90 and older. He is in agreement with that.   Gaye Pollack, MD 11/04/2013

## 2013-11-04 NOTE — Progress Notes (Signed)
Stroke Team Progress Note  HISTORY Willie Freeman is a 68 y.o. male with no relevant past medical history, brought in as a code stroke due to acute onset of confusion and right-sided weakness. He was initially evaluated at [facility] and transferred to [facility].  Patient stated that around 11 pm on 11/01/2013 he developed a warm sensation in his chest followed by HA and chest pressure. This took him to [facility] emergency department. He was initially assessed by the ER physician when he developed acute confusion. The CT scan at [place] was not working and the patient was subsequently transferred to [facility]. According to patient and wife, at the time of initial ED evaluation at [facility] there was some weakness of the right side but they denied slurred speech, face weakness, imbalance, focal numbness, vertigo, double vision, language or vision disturbances.  Wife said that for approximately 45 minutes her husband was " confused and with slow thinking process".   NIHSS 1 on immediate presentation to [facility] but rapidly NIHSS 0. CT brain showed no acute abnormality. EKG showed no atrial fibrillation but nonspecific anterior ST changes with an abnormal ST morphology without criteria for ST elevation MI at that time. The patient was still complaining of chest pressure.   Date last known well: 11/02/11  Time last known well: 10 pm  tPA Given: no, symptoms resolved  NIHSS: 0  MRS: 0   SUBJECTIVE The patient's wife is at the bedside. Dr. Leonie Man discussed the results of the TEE including the abnormal aortic valve and possible need for surgical intervention.   OBJECTIVE Most recent Vital Signs: Filed Vitals:   11/03/13 1715 11/03/13 1909 11/03/13 2006 11/04/13 0456  BP: 163/72 151/75 143/73 149/58  Pulse: 64 69 64 61  Temp: 98.5 F (36.9 C) 98.3 F (36.8 C) 98.9 F (37.2 C) 97.9 F (36.6 C)  TempSrc: Oral Oral Oral Oral  Resp:  16 18 20   Height:       Weight:    168 lb (76.204 kg)  SpO2: 96% 95% 98% 95%   CBG (last 3)  No results found for this basename: GLUCAP,  in the last 72 hours  IV Fluid Intake:      MEDICATIONS  . aspirin EC  81 mg Oral Daily  . atorvastatin  80 mg Oral q1800  . heparin  5,000 Units Subcutaneous 3 times per day  . metoprolol tartrate  12.5 mg Oral BID  . sodium chloride  3 mL Intravenous Q12H   PRN:  sodium chloride, nitroGLYCERIN, ondansetron (ZOFRAN) IV, sodium chloride  Diet:  Cardiac   Activity:  Bedrest  DVT Prophylaxis:  SCDs CLINICALLY SIGNIFICANT STUDIES Basic Metabolic Panel:   Recent Labs Lab 11/02/13 0335 11/02/13 0344  NA 144 143  K 4.2 3.9  CL 107 107  CO2 26  --   GLUCOSE 120* 119*  BUN 19 18  CREATININE 0.78 0.90  CALCIUM 8.7  --    Liver Function Tests:   Recent Labs Lab 11/02/13 0335  AST 18  ALT 16  ALKPHOS 83  BILITOT 0.3  PROT 6.2  ALBUMIN 3.4*   CBC:   Recent Labs Lab 11/02/13 0335  11/03/13 0331 11/04/13 0506  WBC 5.9  --  6.7 4.6  NEUTROABS 4.4  --   --   --   HGB 12.7*  < > 12.2* 12.5*  HCT 38.5*  < > 36.5* 37.9*  MCV 88.3  --  88.6 89.8  PLT 136*  --  129* 130*  < > = values in this interval not displayed. Coagulation:   Recent Labs Lab 11/02/13 0335  LABPROT 12.6  INR 0.96   Cardiac Enzymes:   Recent Labs Lab 11/02/13 1305 11/02/13 2015 11/03/13 0902  TROPONINI 5.84* 6.96* 2.47*   Urinalysis: No results found for this basename: COLORURINE, APPERANCEUR, LABSPEC, PHURINE, GLUCOSEU, HGBUR, BILIRUBINUR, KETONESUR, PROTEINUR, UROBILINOGEN, NITRITE, LEUKOCYTESUR,  in the last 168 hours Lipid Panel    Component Value Date/Time   CHOL 157 11/03/2013 0331   HgbA1C  Lab Results  Component Value Date   HGBA1C 5.6 11/02/2013    Urine Drug Screen:   No results found for this basename: labopia,  cocainscrnur,  labbenz,  amphetmu,  thcu,  labbarb    Alcohol Level:   Recent Labs Lab 11/02/13 0335  ETH <11    Mr Willie Freeman Head Wo  Contrast 11/02/2013    1. Acute punctate nonhemorrhagic infarct within the the high right frontal lobe cortex. 2. Remote lacunar infarcts within the caudate head and cerebellum bilaterally.    MRA HEAD FINDINGS   The internal carotid arteries are within normal limits from the high cervical segments through the ICA termini bilaterally. The A1 and M1 segments are normal. The anterior communicating artery is patent. The MCA bifurcations are intact. The ACA and MCA branch vessels are unremarkable.  The left vertebral artery is the dominant vessel. The PICA origins are visualized and normal bilaterally. The basilar artery is normal. The right posterior cerebral artery originates from the basilar tip. The left posterior cerebral artery is of fetal type. The PCA branch vessels are within normal limits.    2D Echocardiogram  Not ordered  Carotid Doppler  The vertebral arteries appear patent with antegrade flow - Findings consistent with 1-39 percent stenosis involving the right internal carotid artery and the left internal carotid artery.   TEE  - Left ventricle: The cavity size was normal. Wall thickness was increased in a pattern of severe LVH. There was concentric hypertrophy. The estimated ejection fraction was in the range of 55% to 60%. Wall motion was normal; there were no regional wall motion abnormalities. - Aortic valve: Normal-sized, mildly to moderately calcified annulus. Bicuspid; severely thickened, severely calcified leaflets; fusion of the right-noncoronary commissure. Transvalvular velocity was increased. There was severe stenosis. Moderate to severe regurgitation directed eccentrically in the LVOT. Mean gradient: 38mm Hg (S). Peak gradient: 57mm Hg (S). Valve area: 0.86cm^2(VTI). Valve area: 1.04cm^2 (Vmax). Regurgitation pressure half-time:260ms. - Mitral valve: Mildly calcified annulus. - Left atrium: The atrium was moderately dilated. No evidence of thrombus in the atrial  cavity or appendage. - Right atrium: No evidence of thrombus in the atrial cavity or appendage. - Atrial septum: No defect or patent foramen ovale was identified. Echo contrast study showed no right-to-left atrial level shunt, following an increase in RA pressure induced by provocative maneuvers. - Tricuspid valve: No evidence of vegetation. - Pulmonic valve: No evidence of vegetation.   CXR  Stable cardiomegaly, no acute pulmonary process.   EKG  NSR -  LVH  Therapy Recommendations no further therapy recommended  Physical Exam   Pleasant middle aged caucasian male not in distress.Awake alert. Afebrile. Head is nontraumatic. Neck is supple without bruit. Hearing is normal. Cardiac exam no murmur or gallop. Lungs are clear to auscultation. Distal pulses are well felt. Neurological Exam :    Awake  Alert oriented x 3.Diminished recall 2/3. Normal speech and language.eye movements full without nystagmus.fundi were not visualized. Vision  acuity and fields appear normal. Hearing is normal. Palatal movements are normal. Face symmetric. Tongue midline. Normal strength, tone, reflexes and coordination. Normal sensation. Gait deferred.   ASSESSMENT Mr. Willie Freeman is a 68 y.o. male presenting with transient confusion, memory loss and RUE weakness from bilateral tiny embolic infarcts -source aortic stenosis possible versus paroxysmal AFIB.  On aspirin 81 mg orally every day prior to admission. Now on aspirin 81 mg orally every day for secondary stroke prevention. Patient with resultant  No deficits. Stroke work up underway.   Aortic stenosis/aortic regurgitation - bicuspid valve  Cholesterol 157  Non-ST elevation MI   Hospital day # 2  TREATMENT/PLAN  Continue  aspirin 81 mg orally every day for secondary stroke prevention.  Therapists recommend no further therapy  Surgical consultation for possible aortic valve replacement  Stroke team will sign off. Please call if we can be of  further assistance.  Followup Dr. Leonie Man in 2 months.  Willie Bussing PA-C [facility] Pager [phone] 11/04/2013, 2:36 PM I have personally examined this patient, reviewed pertinent data, developed of care and agree with the above Antony Contras, MD  To contact Stroke Continuity provider, please refer to [email or web address] After hours, contact General Neurology

## 2013-11-04 NOTE — Discharge Instructions (Signed)
STROKE/TIA DISCHARGE INSTRUCTIONS SMOKING Cigarette smoking nearly doubles your risk of having a stroke & is the single most alterable risk factor  If you smoke or have smoked in the last 12 months, you are advised to quit smoking for your health.  Most of the excess cardiovascular risk related to smoking disappears within a year of stopping.  Ask you doctor about anti-smoking medications  [place] Quit Line: 1-800-QUIT NOW  Free Smoking Cessation Classes [phone]  CHOLESTEROL Know your levels; limit fat & cholesterol in your diet  Lipid Panel     Component Value Date/Time   CHOL 157 11/03/2013 0331   TRIG 113 11/03/2013 0331   HDL 37* 11/03/2013 0331   CHOLHDL 4.2 11/03/2013 0331   VLDL 23 11/03/2013 0331   LDLCALC 97 11/03/2013 0331      Many patients benefit from treatment even if their cholesterol is at goal.  Goal: Total Cholesterol (CHOL) less than 160  Goal:  Triglycerides (TRIG) less than 150  Goal:  HDL greater than 40  Goal:  LDL (LDLCALC) less than 100   BLOOD PRESSURE American Stroke Association blood pressure target is less that 120/80 mm/Hg  Your discharge blood pressure is:  BP: 153/63 mmHg  Monitor your blood pressure  Limit your salt and alcohol intake  Many individuals will require more than one medication for high blood pressure  DIABETES (A1c is a blood sugar average for last 3 months) Goal HGBA1c is under 7% (HBGA1c is blood sugar average for last 3 months)  Diabetes: No known diagnosis of diabetes    Lab Results  Component Value Date   HGBA1C 5.6 11/02/2013     Your HGBA1c can be lowered with medications, healthy diet, and exercise.  Check your blood sugar as directed by your physician  Call your physician if you experience unexplained or low blood sugars.  PHYSICAL ACTIVITY/REHABILITATION Goal is 30 minutes at least 4 days per week  Activity: Increase activity slowly, Therapies:  Return to work:   Activity decreases your risk of heart attack  and stroke and makes your heart stronger.  It helps control your weight and blood pressure; helps you relax and can improve your mood.  Participate in a regular exercise program.  Talk with your doctor about the best form of exercise for you (dancing, walking, swimming, cycling).  DIET/WEIGHT Goal is to maintain a healthy weight  Your discharge diet is: Cardiac  liquids Your height is:  Height: 5\' 11"  (180.3 cm) Your current weight is: Weight: 76.204 kg (168 lb) Your Body Mass Index (BMI) is:  BMI (Calculated): 23.9  Following the type of diet specifically designed for you will help prevent another stroke.  Your goal weight range is:    Your goal Body Mass Index (BMI) is 19-24.  Healthy food habits can help reduce 3 risk factors for stroke:  High cholesterol, hypertension, and excess weight.  RESOURCES Stroke/Support Group:  Call [phone]   STROKE EDUCATION PROVIDED/REVIEWED AND GIVEN TO PATIENT Stroke warning signs and symptoms How to activate emergency medical system (call 911). Medications prescribed at discharge. Need for follow-up after discharge. Personal risk factors for stroke. Pneumonia vaccine given: No Flu vaccine given: No My questions have been answered, the writing is legible, and I understand these instructions.  I will adhere to these goals & educational materials that have been provided to me after my discharge from the hospital.

## 2013-11-04 NOTE — Discharge Summary (Signed)
Physician Discharge Summary  Patient ID: Willie Freeman MRN: [id number] DOB/AGE: 70-Mar-1947 70 y.o.  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 The above events were felt to be cardioembolic from degenerated aortic valve. Secondary Discharge Diagnosis Hypertension BPH  Significant Diagnostic Studies:  Emergent coronary angiography 11/02/2013: Hemodynamic data:  Left ventricular pressure was 190/30 with LVEDP of 40 mm mercury. Aortic pressure was 122/71 with a mean of 96 mm mercury. There was 69 mm Hg peak-peak, mean gradient of 47 mmHg, consistent with severe aortic stenosis. The gradient probably worse due to presence of aortic regurgitation.  Ascending aortogram: The aortic valve was moderate to severely calcified, appeared to be bicuspid, there is severe aortic regurgitation.  Left ventricle: Performed in the RAO projection revealed LVEF of 60%. Marked LVH. There was No significant MR. No wall motion abnormality.  Right coronary artery: The vessel is smooth, normal, Dominant.  Left main coronary artery is large and normal.  Circumflex coronary artery: A large vessel giving origin to a large obtuse marginal 1. It is smooth and normal.  LAD: LAD gives origin to a large sized D1, moderate D2 and a moderate size D3. LAD is a large-caliber vessel. There was no significant luminal obstruction. Minimal calcification is evident in the proximal segment.    TEE 11/03/2013: Left ventricle: The cavity size was normal. Wall thickness was increased in a pattern of severe LVH. There was concentric hypertrophy. The estimated ejection fraction was in the range of 55% to 60%. Wall motion was normal; there were no regional wall motion abnormalities. Aortic valve: Normal-sized, mildly to moderately calcified annulus. Bicuspid; severely thickened, severely calcified leaflets; fusion of the right-noncoronary commissure. Transvalvular  velocity was increased. There was severe stenosis. Moderate to severe regurgitation directed eccentrically in the LVOT. Mean gradient: 35mm Hg (S). Peak gradient: 98mm Hg (S). Valve area: 0.86cm^2(VTI). Valve area: 1.04cm^2 (Vmax). Regurgitation pressure half-time:248ms. Mitral valve: Mildly calcified annulus. - Left atrium: The atrium was moderately dilated. No vidence of thrombus in the atrial cavity or appendage.  Carotid duplex: No significant carotid artery stenosis bilaterally.  Consults:  Dr. Antony Contras for management of TIA/Stroke recommended aspirin alone, suspect aortic valve with a source of TIA, atrial fibrillation needs to be in mind.  Dr. Gilford Raid for evaluation of a valve replacement. Patient scheduled for elective aortic valve replacement on 11/14/2013.  Hospital Course: Patient is a fairly active 68 year old Caucasian male history of BPH, otherwise has not seen a physician in many years, woke up at around 11 PM last night on 1212/2050 with chest pressure, feeling warm, right to walk in the house but started to feel worse. He was taken to [facility] there he was evaluated for acute coronary syndrome with nonspecific ST segment changes and T-wave changes. However while being worked up for ACS, patient her acute confusional state and right-sided weakness. He was then transferred emergently to [facility] as "code stroke". He was evaluated by neurology, was felt to have left brain TIA. However patient continues to have chest discomfort associated with EKG changes of subtle ST segment elevation in the lateral leads which were back to baseline, with resolution of chest pain. He underwent emergent coronary angiography and was found to have no significant coronary artery disease. He was found to have severe aortic regurgitation and at least moderately severe aortic stenosis.  He underwent MRI the following morning which revealed small microinfarcts in the  right frontal area and  old caudate head and cerebellum bilaterally which are old strokes.  He was evaluated by Dr. Gilford Raid and due to the scheduling issues, patient felt safe for discharge with elective surgery. Patient and his family members were comfortable with the decision making, and clear instructions were given to contact us if he had any concerns or questions.   Recommendations on discharge: Patient advised not to do any heavy exertional activities, routine activities at home, activities of daily living was felt to be appropriate at this time until surgery. Patient has been placed on high-dose of a statin for CV protection until a valve replacement, also started on a beta blocker due to elevated blood pressure, hypotension and recent non-ST elevation myocardial infarction.  Discharge Exam: Blood pressure 153/63, pulse 60, temperature 98.3 F (36.8 C), temperature source Oral, resp. rate 18, height 5\' 11"  (1.803 m), weight 76.204 kg (168 lb), SpO2 97.00%.  General appearance: alert, cooperative, appears stated age, no distress and He appears to and has marfanoid appearance.  Eyes: negative findings: lids and lashes normal and conjunctivae and sclerae normal  Neck: JVP - normal, carotids 2 conducted bruit heard Lungs: Bilateral equal breath sounds, no added sounds.   Chest wall: no tenderness  Cardio: S1, S2 normal and There is a blowing ejection systolic murmur 3/6, and along early diastolic murmur of grade 3/4 intensity heard in the right sternal border. Also heard in the apex. Conducted to the carotids.  GI: soft, non-tender; bowel sounds normal; no masses, no organomegaly  Extremities: extremities normal, atraumatic, no cyanosis or edema  Pulses: 2+ and symmetric Labs:   Lab Results  Component Value Date   WBC 4.6 11/04/2013   HGB 12.5* 11/04/2013   HCT 37.9* 11/04/2013   MCV 89.8 11/04/2013   PLT 130* 11/04/2013    Recent Labs Lab 11/02/13 0335 11/02/13 0344  NA 144 143   K 4.2 3.9  CL 107 107  CO2 26  --   BUN 19 18  CREATININE 0.78 0.90  CALCIUM 8.7  --   PROT 6.2  --   BILITOT 0.3  --   ALKPHOS 83  --   ALT 16  --   AST 18  --   GLUCOSE 120* 119*   Lab Results  Component Value Date   TROPONINI 2.47* 11/03/2013    Lipid Panel     Component Value Date/Time   CHOL 157 11/03/2013 0331   TRIG 113 11/03/2013 0331   HDL 37* 11/03/2013 0331   CHOLHDL 4.2 11/03/2013 0331   VLDL 23 11/03/2013 0331   LDLCALC 97 11/03/2013 0331    EKG: 11/03/2013: Normal sinus rhythm, LVH with repolarization abnormality.     Radiology:   MRI 11/02/2013: IMPRESSION: 1. Acute punctate nonhemorrhagic infarct within the the high right frontal lobe cortex. 2. Remote lacunar infarcts within the caudate head and cerebellum bilaterally.   Dg Chest Portable 1 View 11/02/2013: IMPRESSION: Stable cardiomegaly, no acute pulmonary process.  FOLLOW UP PLANS AND APPOINTMENTS    Medication List         aspirin 325 MG EC tablet  Take 1 tablet (325 mg total) by mouth daily.     atorvastatin 80 MG tablet  Commonly known as:  LIPITOR  Take 1 tablet (80 mg total) by mouth daily at 6 PM.     finasteride 5 MG tablet  Commonly known as:  PROSCAR  Take 5 mg by mouth daily.     metoprolol tartrate 25 MG tablet  Commonly known  as:  LOPRESSOR  Take 0.5 tablets (12.5 mg total) by mouth 2 (two) times daily.     tamsulosin 0.4 MG Caps capsule  Commonly known as:  FLOMAX  Take 0.4 mg by mouth daily.     VESICARE 5 MG tablet  Generic drug:  solifenacin  Take 5 mg by mouth daily.           Follow-up Information   Follow up with Gaye Pollack, MD. (As needed. You are set up for AV surgery and Dr. Cyndia Bent will be in touch with you.)    Specialty:  Cardiothoracic Surgery   Contact information:   [address] [place] [address] [phone]       Follow up with Laverda Page, MD. ($ -6 weeks. Dr. Einar Gip will be available if you have questions before  your Aortic Valve surgery next week.)    Specialty:  Cardiology   Contact information:   [place] [place] [address] [phone]        Laverda Page, MD 11/04/2013, 6:26 PM  Pager: [phone] Office: [phone] If no answer: [phone]

## 2013-11-07 ENCOUNTER — Other Ambulatory Visit: Payer: Self-pay | Admitting: *Deleted

## 2013-11-07 DIAGNOSIS — I359 Nonrheumatic aortic valve disorder, unspecified: Secondary | ICD-10-CM

## 2013-11-08 ENCOUNTER — Encounter (HOSPITAL_COMMUNITY): Payer: Self-pay | Admitting: Pharmacy Technician

## 2013-11-10 NOTE — Pre-Procedure Instructions (Signed)
Timarion Agcaoili  11/10/2013   Your procedure is scheduled on: Tuesday, Nov 15, 2013  Report to [place] Stay (use Main Entrance "A'') at 5;30 AM.  Call this number if you have problems the morning of surgery: [phone]   Remember:   Do not eat food or drink liquids after midnight.   Take these medicines the morning of surgery with A SIP OF WATER:  metoprolol tartrate (LOPRESSOR)  Do not take any NSAIDs ie: Ibuprofen, Advil, Naproxen ( Aleve) or etc.   Do not wear jewelry   Do not wear lotions, powders, or perfumes. You may NOT wear deodorant.    Men may shave face and neck.  Do not bring valuables to the hospital.  [facility] is not responsible  for any belongings or valuables.               Contacts, dentures or bridgework may not be worn into surgery.   Leave suitcase in the car. After surgery it may be brought to your room.   For patients admitted to the hospital, discharge time is determined by your treatment team.               Patients discharged the day of surgery will not be allowed to drive home.  Name and phone number of your driver: /w family   Special Instructions:  Special Instructions:Special Instructions: [place] - Preparing for Surgery  Before surgery, you can play an important role.  Because skin is not sterile, your skin needs to be as free of germs as possible.  You can reduce the number of germs on you skin by washing with CHG (chlorahexidine gluconate) soap before surgery.  CHG is an antiseptic cleaner which kills germs and bonds with the skin to continue killing germs even after washing.  Please DO NOT use if you have an allergy to CHG or antibacterial soaps.  If your skin becomes reddened/irritated stop using the CHG and inform your nurse when you arrive at Short Stay.  Do not shave (including legs and underarms) for at least 48 hours prior to the first CHG shower.  You may shave your face.  Please follow these instructions carefully:   1.   Shower with CHG Soap the night before surgery and the morning of Surgery.  2.  If you choose to wash your hair, wash your hair first as usual with your normal shampoo.  3.  After you shampoo, rinse your hair and body thoroughly to remove the Shampoo.  4.  Use CHG as you would any other liquid soap.  You can apply chg directly  to the skin and wash gently with scrungie or a clean washcloth.  5.  Apply the CHG Soap to your body ONLY FROM THE NECK DOWN.  Do not use on open wounds or open sores.  Avoid contact with your eyes, ears, mouth and genitals (private parts).  Wash genitals (private parts) with your normal soap.  6.  Wash thoroughly, paying special attention to the area where your surgery will be performed.  7.  Thoroughly rinse your body with warm water from the neck down.  8.  DO NOT shower/wash with your normal soap after using and rinsing off the CHG Soap.  9.  Pat yourself dry with a clean towel.            10.  Wear clean pajamas.            11.  Place clean sheets on  your bed the night of your first shower and do not sleep with pets.  Day of Surgery  Do not apply any lotions/deodorants the morning of surgery.  Please wear clean clothes to the hospital/surgery center.   Please read over the following fact sheets that you were given: Pain Booklet, Coughing and Deep Breathing, Blood Transfusion Information, Open Heart Packet, MRSA Information and Surgical Site Infection Prevention

## 2013-11-11 ENCOUNTER — Encounter (HOSPITAL_COMMUNITY): Payer: Self-pay

## 2013-11-11 ENCOUNTER — Ambulatory Visit (HOSPITAL_COMMUNITY)
Admission: RE | Admit: 2013-11-11 | Discharge: 2013-11-11 | Disposition: A | Discharge status: Home / Self Care | Payer: Medicare Other | Source: Ambulatory Visit | Attending: Surgery | Admitting: Surgery

## 2013-11-11 ENCOUNTER — Encounter (HOSPITAL_COMMUNITY)
Admission: RE | Admit: 2013-11-11 | Discharge: 2013-11-11 | Disposition: A | Discharge status: Home / Self Care | Payer: Medicare Other | Source: Ambulatory Visit | Attending: Surgery | Admitting: Surgery

## 2013-11-11 VITALS — BP 144/81 | HR 55 | Temp 98.2°F | Resp 18 | Ht 71.0 in | Wt 170.7 lb

## 2013-11-11 DIAGNOSIS — Z79899 Other long term (current) drug therapy: Secondary | ICD-10-CM | POA: Insufficient documentation

## 2013-11-11 DIAGNOSIS — I359 Nonrheumatic aortic valve disorder, unspecified: Secondary | ICD-10-CM

## 2013-11-11 DIAGNOSIS — G459 Transient cerebral ischemic attack, unspecified: Secondary | ICD-10-CM | POA: Insufficient documentation

## 2013-11-11 DIAGNOSIS — Z01810 Encounter for preprocedural cardiovascular examination: Secondary | ICD-10-CM

## 2013-11-11 HISTORY — DX: Essential (primary) hypertension: I10

## 2013-11-11 HISTORY — DX: Cerebral infarction, unspecified: I63.9

## 2013-11-11 HISTORY — DX: Cardiac arrhythmia, unspecified: I49.9

## 2013-11-11 HISTORY — DX: Cardiac murmur, unspecified: R01.1

## 2013-11-11 HISTORY — DX: Acute myocardial infarction, unspecified: I21.9

## 2013-11-11 LAB — URINALYSIS, ROUTINE W REFLEX MICROSCOPIC
Bilirubin Urine: NEGATIVE
Glucose, UA: NEGATIVE mg/dL
Hgb urine dipstick: NEGATIVE
Ketones, ur: NEGATIVE mg/dL
LEUKOCYTES UA: NEGATIVE
NITRITE: NEGATIVE
PH: 5.5 (ref 5.0–8.0)
Protein, ur: NEGATIVE mg/dL
SPECIFIC GRAVITY, URINE: 1.029 (ref 1.005–1.030)
Urobilinogen, UA: 0.2 mg/dL (ref 0.0–1.0)

## 2013-11-11 LAB — CBC
HEMATOCRIT: 36.4 % — AB (ref 39.0–52.0)
Hemoglobin: 12.2 g/dL — ABNORMAL LOW (ref 13.0–17.0)
MCH: 29.8 pg (ref 26.0–34.0)
MCHC: 33.5 g/dL (ref 30.0–36.0)
MCV: 89 fL (ref 78.0–100.0)
Platelets: 123 10*3/uL — ABNORMAL LOW (ref 150–400)
RBC: 4.09 MIL/uL — ABNORMAL LOW (ref 4.22–5.81)
RDW: 14 % (ref 11.5–15.5)
WBC: 4.9 10*3/uL (ref 4.0–10.5)

## 2013-11-11 LAB — COMPREHENSIVE METABOLIC PANEL
ALK PHOS: 94 U/L (ref 39–117)
ALT: 19 U/L (ref 0–53)
AST: 16 U/L (ref 0–37)
Albumin: 3.2 g/dL — ABNORMAL LOW (ref 3.5–5.2)
BUN: 20 mg/dL (ref 6–23)
CO2: 23 meq/L (ref 19–32)
Calcium: 8.6 mg/dL (ref 8.4–10.5)
Chloride: 108 mEq/L (ref 96–112)
Creatinine, Ser: 0.77 mg/dL (ref 0.50–1.35)
GFR calc Af Amer: >90 mL/min (ref >90)
Glucose, Bld: 98 mg/dL (ref 70–99)
POTASSIUM: 4.2 meq/L (ref 3.7–5.3)
SODIUM: 141 meq/L (ref 137–147)
TOTAL PROTEIN: 6.1 g/dL (ref 6.0–8.3)
Total Bilirubin: 0.6 mg/dL (ref 0.3–1.2)

## 2013-11-11 LAB — ABO/RH: ABO/RH(D): O POS

## 2013-11-11 LAB — BLOOD GAS, ARTERIAL
Acid-Base Excess: 0.8 mmol/L (ref 0.0–2.0)
Bicarbonate: 25 mEq/L — ABNORMAL HIGH (ref 20.0–24.0)
Drawn by: 206361
O2 SAT: 98 %
PATIENT TEMPERATURE: 98.6
PO2 ART: 100 mmHg (ref 80.0–100.0)
TCO2: 26.2 mmol/L (ref 0–100)
pCO2 arterial: 40.4 mmHg (ref 35.0–45.0)
pH, Arterial: 7.408 (ref 7.350–7.450)

## 2013-11-11 LAB — HEMOGLOBIN A1C
HEMOGLOBIN A1C: 5.5 % (ref <5.7)
Mean Plasma Glucose: 111 mg/dL (ref <117)

## 2013-11-11 LAB — PROTIME-INR
INR: 1.03 (ref 0.00–1.49)
Prothrombin Time: 13.3 seconds (ref 11.6–15.2)

## 2013-11-11 LAB — SURGICAL PCR SCREEN
MRSA, PCR: NEGATIVE
STAPHYLOCOCCUS AUREUS: POSITIVE — AB

## 2013-11-11 LAB — APTT: aPTT: 32 seconds (ref 24–37)

## 2013-11-11 MED ORDER — ALBUTEROL SULFATE (2.5 MG/3ML) 0.083% IN NEBU
2.5000 mg | INHALATION_SOLUTION | Freq: Once | RESPIRATORY_TRACT | Status: AC
  Administered 2013-11-11: 2.5 mg via RESPIRATORY_TRACT

## 2013-11-11 NOTE — Progress Notes (Signed)
VASCULAR LAB PRELIMINARY  PRELIMINARY  PRELIMINARY  PRELIMINARY  Pre-op Cardiac Surgery   Upper Extremity Right Left  Brachial Pressures 151 triphasic 162 triphasic  Radial Waveforms triphasic triphasic  Ulnar Waveforms triphasic triphasic  Palmar Arch (Allen's Test) * **   Findings:  *Right:  Doppler waveforms obliterate with radial and remain normal with ulnar compressions.  **Left:  Doppler waveforms remain normal with radial and ulnar compressions.    Charlaine Dalton, RVT 11/11/2013, 12:07 PM

## 2013-11-14 MED ORDER — CHLORHEXIDINE GLUCONATE 4 % EX LIQD
30.0000 mL | CUTANEOUS | Status: DC
  Filled 2013-11-14: qty 30

## 2013-11-14 MED ORDER — CEFUROXIME SODIUM 750 MG IJ SOLR
750.0000 mg | INTRAMUSCULAR | Status: DC
  Filled 2013-11-14: qty 750

## 2013-11-14 MED ORDER — DOPAMINE-DEXTROSE 3.2-5 MG/ML-% IV SOLN
2.0000 ug/kg/min | INTRAVENOUS | Status: DC
  Filled 2013-11-14: qty 250

## 2013-11-14 MED ORDER — POTASSIUM CHLORIDE 2 MEQ/ML IV SOLN
80.0000 meq | INTRAVENOUS | Status: DC
  Filled 2013-11-14: qty 40

## 2013-11-14 MED ORDER — SODIUM CHLORIDE 0.9 % IV SOLN
INTRAVENOUS | Status: DC
  Filled 2013-11-14: qty 40

## 2013-11-14 MED ORDER — NITROGLYCERIN IN D5W 200-5 MCG/ML-% IV SOLN
2.0000 ug/min | INTRAVENOUS | Status: DC
  Filled 2013-11-14: qty 250

## 2013-11-14 MED ORDER — VANCOMYCIN HCL 10 G IV SOLR
1250.0000 mg | INTRAVENOUS | Status: AC
  Administered 2013-11-15: 1250 mg via INTRAVENOUS
  Filled 2013-11-14: qty 1250

## 2013-11-14 MED ORDER — DEXMEDETOMIDINE HCL IN NACL 400 MCG/100ML IV SOLN
0.1000 ug/kg/h | INTRAVENOUS | Status: DC
Start: 2013-11-15 — End: 2013-11-15
  Filled 2013-11-14: qty 100

## 2013-11-14 MED ORDER — SODIUM CHLORIDE 0.9 % IV SOLN
INTRAVENOUS | Status: DC
  Filled 2013-11-14: qty 1

## 2013-11-14 MED ORDER — MAGNESIUM SULFATE 50 % IJ SOLN
40.0000 meq | INTRAMUSCULAR | Status: DC
  Filled 2013-11-14: qty 10

## 2013-11-14 MED ORDER — PHENYLEPHRINE HCL 10 MG/ML IJ SOLN
30.0000 ug/min | INTRAVENOUS | Status: DC
  Filled 2013-11-14: qty 2

## 2013-11-14 MED ORDER — PAPAVERINE HCL 30 MG/ML IJ SOLN
INTRAMUSCULAR | Status: DC
  Filled 2013-11-14: qty 2.5

## 2013-11-14 MED ORDER — ARTIFICIAL TEARS OP OINT
TOPICAL_OINTMENT | OPHTHALMIC | Status: AC
  Filled 2013-11-14: qty 3.5

## 2013-11-14 MED ORDER — DEXTROSE 5 % IV SOLN
1.5000 g | INTRAVENOUS | Status: AC
  Administered 2013-11-15: .75 g via INTRAVENOUS
  Administered 2013-11-15: 1.5 g via INTRAVENOUS
  Filled 2013-11-14: qty 1.5

## 2013-11-14 MED ORDER — EPINEPHRINE HCL 1 MG/ML IJ SOLN
0.5000 ug/min | INTRAMUSCULAR | Status: DC
  Filled 2013-11-14: qty 4

## 2013-11-14 MED ORDER — METOPROLOL TARTRATE 12.5 MG HALF TABLET: 12.5000 mg | ORAL_TABLET | Freq: Once | ORAL | Status: DC

## 2013-11-14 MED ORDER — SODIUM CHLORIDE 0.9 % IV SOLN
INTRAVENOUS | Status: DC
  Filled 2013-11-14: qty 30

## 2013-11-14 NOTE — Anesthesia Preprocedure Evaluation (Addendum)
Anesthesia Evaluation  Patient identified by MRN, date of birth, ID band Patient awake    Reviewed: Allergy & Precautions, H&P , NPO status , Patient's Chart, lab work & pertinent test results  History of Anesthesia Complications Negative for: history of anesthetic complications  Airway Mallampati: II TM Distance: <3 FB Neck ROM: Full    Dental  (+) Dental Advisory Given, Poor Dentition, Teeth Intact   Pulmonary former smoker,    Pulmonary exam normal       Cardiovascular hypertension, Pt. on home beta blockers + Past MI + dysrhythmias + Valvular Problems/Murmurs (EF 60%) AS and AI Rhythm:Regular Rate:Bradycardia + Systolic murmurs    Neuro/Psych Back pain TIA (11/02/13)CVA, No Residual Symptoms negative neurological ROS     GI/Hepatic negative GI ROS, Neg liver ROS,   Endo/Other  negative endocrine ROS  Renal/GU negative Renal ROS     Musculoskeletal   Abdominal   Peds  Hematology   Anesthesia Other Findings   Reproductive/Obstetrics                      Anesthesia Physical Anesthesia Plan  ASA: IV  Anesthesia Plan: General   Post-op Pain Management:    Induction: Intravenous  Airway Management Planned: Oral ETT  Additional Equipment: Arterial line, 3D TEE and PA Cath  Intra-op Plan:   Post-operative Plan: Post-operative intubation/ventilation  Informed Consent: I have reviewed the patients History and Physical, chart, labs and discussed the procedure including the risks, benefits and alternatives for the proposed anesthesia with the patient or authorized representative who has indicated his/her understanding and acceptance.   Dental advisory given  Plan Discussed with: CRNA, Anesthesiologist and Surgeon  Anesthesia Plan Comments:        Anesthesia Quick Evaluation

## 2013-11-15 ENCOUNTER — Inpatient Hospital Stay (HOSPITAL_COMMUNITY): Payer: Medicare Other

## 2013-11-15 ENCOUNTER — Encounter (HOSPITAL_COMMUNITY): Payer: Medicare Other | Admitting: Certified Registered Nurse Anesthetist

## 2013-11-15 ENCOUNTER — Encounter (HOSPITAL_COMMUNITY): Payer: Self-pay | Admitting: *Deleted

## 2013-11-15 ENCOUNTER — Inpatient Hospital Stay (HOSPITAL_COMMUNITY): Payer: Medicare Other | Admitting: Certified Registered Nurse Anesthetist

## 2013-11-15 ENCOUNTER — Inpatient Hospital Stay (HOSPITAL_COMMUNITY)
Admission: RE | Admit: 2013-11-15 | Discharge: 2013-11-19 | DRG: 220 | Disposition: A | Discharge status: Home / Self Care | Payer: Medicare Other | Source: Ambulatory Visit | Attending: Surgery | Admitting: Surgery

## 2013-11-15 ENCOUNTER — Encounter (HOSPITAL_COMMUNITY)
Admission: RE | Disposition: A | Discharge status: Home / Self Care | Payer: Medicare Other | Source: Ambulatory Visit | Attending: Surgery

## 2013-11-15 DIAGNOSIS — Z953 Presence of xenogenic heart valve: Secondary | ICD-10-CM

## 2013-11-15 DIAGNOSIS — J9819 Other pulmonary collapse: Secondary | ICD-10-CM | POA: Diagnosis not present

## 2013-11-15 DIAGNOSIS — Z87891 Personal history of nicotine dependence: Secondary | ICD-10-CM

## 2013-11-15 DIAGNOSIS — I252 Old myocardial infarction: Secondary | ICD-10-CM

## 2013-11-15 DIAGNOSIS — I359 Nonrheumatic aortic valve disorder, unspecified: Secondary | ICD-10-CM

## 2013-11-15 DIAGNOSIS — I447 Left bundle-branch block, unspecified: Secondary | ICD-10-CM | POA: Diagnosis not present

## 2013-11-15 DIAGNOSIS — D62 Acute posthemorrhagic anemia: Secondary | ICD-10-CM | POA: Diagnosis not present

## 2013-11-15 DIAGNOSIS — Z8673 Personal history of transient ischemic attack (TIA), and cerebral infarction without residual deficits: Secondary | ICD-10-CM

## 2013-11-15 DIAGNOSIS — D696 Thrombocytopenia, unspecified: Secondary | ICD-10-CM | POA: Diagnosis not present

## 2013-11-15 DIAGNOSIS — Q231 Congenital insufficiency of aortic valve: Principal | ICD-10-CM

## 2013-11-15 DIAGNOSIS — G459 Transient cerebral ischemic attack, unspecified: Secondary | ICD-10-CM

## 2013-11-15 DIAGNOSIS — Z79899 Other long term (current) drug therapy: Secondary | ICD-10-CM

## 2013-11-15 DIAGNOSIS — I10 Essential (primary) hypertension: Secondary | ICD-10-CM | POA: Diagnosis not present

## 2013-11-15 DIAGNOSIS — Z952 Presence of prosthetic heart valve: Secondary | ICD-10-CM

## 2013-11-15 DIAGNOSIS — Z7982 Long term (current) use of aspirin: Secondary | ICD-10-CM

## 2013-11-15 HISTORY — PX: AORTIC VALVE REPLACEMENT: SHX41

## 2013-11-15 HISTORY — PX: INTRAOPERATIVE TRANSESOPHAGEAL ECHOCARDIOGRAM: SHX5062

## 2013-11-15 LAB — CBC
HCT: 29.7 % — ABNORMAL LOW (ref 39.0–52.0)
HEMATOCRIT: 31.1 % — AB (ref 39.0–52.0)
HEMOGLOBIN: 10.6 g/dL — AB (ref 13.0–17.0)
HEMOGLOBIN: 10.1 g/dL — AB (ref 13.0–17.0)
MCH: 30.1 pg (ref 26.0–34.0)
MCH: 29.8 pg (ref 26.0–34.0)
MCHC: 34.1 g/dL (ref 30.0–36.0)
MCHC: 34 g/dL (ref 30.0–36.0)
MCV: 88.4 fL (ref 78.0–100.0)
MCV: 87.6 fL (ref 78.0–100.0)
PLATELETS: 91 10*3/uL — AB (ref 150–400)
Platelets: 84 10*3/uL — ABNORMAL LOW (ref 150–400)
RBC: 3.52 MIL/uL — AB (ref 4.22–5.81)
RBC: 3.39 MIL/uL — ABNORMAL LOW (ref 4.22–5.81)
RDW: 14.2 % (ref 11.5–15.5)
RDW: 14.2 % (ref 11.5–15.5)
WBC: 7.6 10*3/uL (ref 4.0–10.5)
WBC: 10.6 10*3/uL — AB (ref 4.0–10.5)

## 2013-11-15 LAB — POCT I-STAT, CHEM 8
BUN: 10 mg/dL (ref 6–23)
CREATININE: 0.8 mg/dL (ref 0.50–1.35)
Calcium, Ion: 1.19 mmol/L (ref 1.13–1.30)
Chloride: 104 mEq/L (ref 96–112)
Glucose, Bld: 175 mg/dL — ABNORMAL HIGH (ref 70–99)
HCT: 30 % — ABNORMAL LOW (ref 39.0–52.0)
HEMOGLOBIN: 10.2 g/dL — AB (ref 13.0–17.0)
POTASSIUM: 4.1 meq/L (ref 3.7–5.3)
Sodium: 140 mEq/L (ref 137–147)
TCO2: 23 mmol/L (ref 0–100)

## 2013-11-15 LAB — POCT I-STAT 3, ART BLOOD GAS (G3+)
ACID-BASE DEFICIT: 2 mmol/L (ref 0.0–2.0)
Acid-Base Excess: 2 mmol/L (ref 0.0–2.0)
Acid-base deficit: 1 mmol/L (ref 0.0–2.0)
BICARBONATE: 27.4 meq/L — AB (ref 20.0–24.0)
BICARBONATE: 23.8 meq/L (ref 20.0–24.0)
Bicarbonate: 23.9 mEq/L (ref 20.0–24.0)
Bicarbonate: 22.2 mEq/L (ref 20.0–24.0)
O2 SAT: 94 %
O2 SAT: 98 %
O2 Saturation: 100 %
O2 Saturation: 99 %
PCO2 ART: 45 mmHg (ref 35.0–45.0)
PCO2 ART: 40 mmHg (ref 35.0–45.0)
PH ART: 7.382 (ref 7.350–7.450)
PO2 ART: 381 mmHg — AB (ref 80.0–100.0)
Patient temperature: 36.4
Patient temperature: 36.7
TCO2: 29 mmol/L (ref 0–100)
TCO2: 25 mmol/L (ref 0–100)
TCO2: 23 mmol/L (ref 0–100)
TCO2: 25 mmol/L (ref 0–100)
pCO2 arterial: 36.7 mmHg (ref 35.0–45.0)
pCO2 arterial: 31.9 mmHg — ABNORMAL LOW (ref 35.0–45.0)
pH, Arterial: 7.393 (ref 7.350–7.450)
pH, Arterial: 7.422 (ref 7.350–7.450)
pH, Arterial: 7.448 (ref 7.350–7.450)
pO2, Arterial: 68 mmHg — ABNORMAL LOW (ref 80.0–100.0)
pO2, Arterial: 94 mmHg (ref 80.0–100.0)
pO2, Arterial: 123 mmHg — ABNORMAL HIGH (ref 80.0–100.0)

## 2013-11-15 LAB — PROTIME-INR
INR: 1.45 (ref 0.00–1.49)
PROTHROMBIN TIME: 17.3 s — AB (ref 11.6–15.2)

## 2013-11-15 LAB — POCT I-STAT 4, (NA,K, GLUC, HGB,HCT)
GLUCOSE: 133 mg/dL — AB (ref 70–99)
Glucose, Bld: 101 mg/dL — ABNORMAL HIGH (ref 70–99)
Glucose, Bld: 111 mg/dL — ABNORMAL HIGH (ref 70–99)
Glucose, Bld: 99 mg/dL (ref 70–99)
Glucose, Bld: 155 mg/dL — ABNORMAL HIGH (ref 70–99)
Glucose, Bld: 110 mg/dL — ABNORMAL HIGH (ref 70–99)
HCT: 25 % — ABNORMAL LOW (ref 39.0–52.0)
HCT: 27 % — ABNORMAL LOW (ref 39.0–52.0)
HCT: 25 % — ABNORMAL LOW (ref 39.0–52.0)
HEMATOCRIT: 30 % — AB (ref 39.0–52.0)
HEMATOCRIT: 22 % — AB (ref 39.0–52.0)
HEMATOCRIT: 31 % — AB (ref 39.0–52.0)
HEMOGLOBIN: 10.2 g/dL — AB (ref 13.0–17.0)
HEMOGLOBIN: 8.5 g/dL — AB (ref 13.0–17.0)
HEMOGLOBIN: 7.5 g/dL — AB (ref 13.0–17.0)
Hemoglobin: 9.2 g/dL — ABNORMAL LOW (ref 13.0–17.0)
Hemoglobin: 8.5 g/dL — ABNORMAL LOW (ref 13.0–17.0)
Hemoglobin: 10.5 g/dL — ABNORMAL LOW (ref 13.0–17.0)
POTASSIUM: 4.2 meq/L (ref 3.7–5.3)
Potassium: 3.8 mEq/L (ref 3.7–5.3)
Potassium: 3.7 mEq/L (ref 3.7–5.3)
Potassium: 3.9 mEq/L (ref 3.7–5.3)
Potassium: 3.9 mEq/L (ref 3.7–5.3)
Potassium: 3.6 mEq/L — ABNORMAL LOW (ref 3.7–5.3)
SODIUM: 142 meq/L (ref 137–147)
SODIUM: 140 meq/L (ref 137–147)
SODIUM: 143 meq/L (ref 137–147)
Sodium: 140 mEq/L (ref 137–147)
Sodium: 141 mEq/L (ref 137–147)
Sodium: 142 mEq/L (ref 137–147)

## 2013-11-15 LAB — GLUCOSE, CAPILLARY
Glucose-Capillary: 90 mg/dL (ref 70–99)
Glucose-Capillary: 92 mg/dL (ref 70–99)
Glucose-Capillary: 117 mg/dL — ABNORMAL HIGH (ref 70–99)
Glucose-Capillary: 139 mg/dL — ABNORMAL HIGH (ref 70–99)
Glucose-Capillary: 141 mg/dL — ABNORMAL HIGH (ref 70–99)

## 2013-11-15 LAB — PREPARE RBC (CROSSMATCH)

## 2013-11-15 LAB — APTT: APTT: 40 s — AB (ref 24–37)

## 2013-11-15 LAB — HEMOGLOBIN AND HEMATOCRIT, BLOOD
HCT: 24 % — ABNORMAL LOW (ref 39.0–52.0)
Hemoglobin: 8.4 g/dL — ABNORMAL LOW (ref 13.0–17.0)

## 2013-11-15 LAB — CREATININE, SERUM
CREATININE: 0.71 mg/dL (ref 0.50–1.35)
GFR calc non Af Amer: >90 mL/min (ref >90)

## 2013-11-15 LAB — PLATELET COUNT: Platelets: 99 10*3/uL — ABNORMAL LOW (ref 150–400)

## 2013-11-15 LAB — MAGNESIUM: Magnesium: 2.6 mg/dL — ABNORMAL HIGH (ref 1.5–2.5)

## 2013-11-15 SURGERY — REPLACEMENT, AORTIC VALVE, OPEN
Anesthesia: General | Site: Chest

## 2013-11-15 MED ORDER — TAMSULOSIN HCL 0.4 MG PO CAPS
0.4000 mg | ORAL_CAPSULE | Freq: Every day | ORAL | Status: DC
  Administered 2013-11-16 – 2013-11-18 (×3): 0.4 mg via ORAL
  Filled 2013-11-15 (×5): qty 1

## 2013-11-15 MED ORDER — DEXMEDETOMIDINE HCL 200 MCG/2ML IV SOLN
200.0000 ug | INTRAVENOUS | Status: DC | PRN
  Administered 2013-11-15: 0.2 ug/kg/h via INTRAVENOUS

## 2013-11-15 MED ORDER — NITROGLYCERIN IN D5W 200-5 MCG/ML-% IV SOLN
0.0000 ug/min | INTRAVENOUS | Status: DC
  Administered 2013-11-15: 20 ug/min via INTRAVENOUS

## 2013-11-15 MED ORDER — VECURONIUM BROMIDE 10 MG IV SOLR
INTRAVENOUS | Status: AC
  Filled 2013-11-15: qty 10

## 2013-11-15 MED ORDER — FINASTERIDE 5 MG PO TABS
5.0000 mg | ORAL_TABLET | Freq: Every day | ORAL | Status: DC
  Administered 2013-11-16 – 2013-11-18 (×3): 5 mg via ORAL
  Filled 2013-11-15 (×5): qty 1

## 2013-11-15 MED ORDER — ASPIRIN 81 MG PO CHEW: 324.0000 mg | CHEWABLE_TABLET | Freq: Every day | ORAL | Status: DC

## 2013-11-15 MED ORDER — POTASSIUM CHLORIDE 10 MEQ/50ML IV SOLN
10.0000 meq | INTRAVENOUS | Status: DC | PRN
  Administered 2013-11-15: 10 meq via INTRAVENOUS
  Filled 2013-11-15: qty 50

## 2013-11-15 MED ORDER — NITROGLYCERIN IN D5W 200-5 MCG/ML-% IV SOLN
INTRAVENOUS | Status: DC | PRN
  Administered 2013-11-15: 10 ug/min via INTRAVENOUS

## 2013-11-15 MED ORDER — SUCCINYLCHOLINE CHLORIDE 20 MG/ML IJ SOLN
INTRAMUSCULAR | Status: DC | PRN
  Administered 2013-11-15: 100 mg via INTRAVENOUS

## 2013-11-15 MED ORDER — ROCURONIUM BROMIDE 50 MG/5ML IV SOLN
INTRAVENOUS | Status: AC
  Filled 2013-11-15: qty 1

## 2013-11-15 MED ORDER — BISACODYL 5 MG PO TBEC
10.0000 mg | DELAYED_RELEASE_TABLET | Freq: Every day | ORAL | Status: DC
  Administered 2013-11-16: 10 mg via ORAL
  Filled 2013-11-15: qty 2

## 2013-11-15 MED ORDER — FENTANYL CITRATE 0.05 MG/ML IJ SOLN
INTRAMUSCULAR | Status: AC
  Filled 2013-11-15: qty 5

## 2013-11-15 MED ORDER — SODIUM CHLORIDE 0.9 % IV SOLN
100.0000 [IU] | INTRAVENOUS | Status: DC | PRN
  Administered 2013-11-15: 1.2 [IU]/h via INTRAVENOUS

## 2013-11-15 MED ORDER — DEXTROSE 5 % IV SOLN
1.5000 g | Freq: Two times a day (BID) | INTRAVENOUS | Status: DC
  Administered 2013-11-15 – 2013-11-16 (×2): 1.5 g via INTRAVENOUS
  Filled 2013-11-15 (×3): qty 1.5

## 2013-11-15 MED ORDER — SODIUM CHLORIDE 0.9 % IJ SOLN
3.0000 mL | Freq: Two times a day (BID) | INTRAMUSCULAR | Status: DC
  Administered 2013-11-16: 3 mL via INTRAVENOUS

## 2013-11-15 MED ORDER — EPHEDRINE SULFATE 50 MG/ML IJ SOLN
INTRAMUSCULAR | Status: AC
  Filled 2013-11-15: qty 1

## 2013-11-15 MED ORDER — FAMOTIDINE IN NACL 20-0.9 MG/50ML-% IV SOLN
20.0000 mg | Freq: Two times a day (BID) | INTRAVENOUS | Status: DC
  Administered 2013-11-15: 20 mg via INTRAVENOUS

## 2013-11-15 MED ORDER — NITROPRUSSIDE SODIUM 25 MG/ML IV SOLN
0.2500 ug/kg/min | INTRAVENOUS | Status: DC
  Filled 2013-11-15: qty 2

## 2013-11-15 MED ORDER — SODIUM CHLORIDE 0.9 % IJ SOLN
INTRAMUSCULAR | Status: AC
  Filled 2013-11-15: qty 10

## 2013-11-15 MED ORDER — VANCOMYCIN HCL IN DEXTROSE 1-5 GM/200ML-% IV SOLN
1000.0000 mg | Freq: Once | INTRAVENOUS | Status: AC
Start: 2013-11-15 — End: 2013-11-15
  Administered 2013-11-15: 1000 mg via INTRAVENOUS
  Filled 2013-11-15: qty 200

## 2013-11-15 MED ORDER — ASPIRIN EC 325 MG PO TBEC
325.0000 mg | DELAYED_RELEASE_TABLET | Freq: Every day | ORAL | Status: DC
  Administered 2013-11-16: 325 mg via ORAL
  Filled 2013-11-15: qty 1

## 2013-11-15 MED ORDER — HEMOSTATIC AGENTS (NO CHARGE) OPTIME
TOPICAL | Status: DC | PRN
  Administered 2013-11-15: 1 via TOPICAL

## 2013-11-15 MED ORDER — MIDAZOLAM HCL 5 MG/5ML IJ SOLN
INTRAMUSCULAR | Status: DC | PRN
  Administered 2013-11-15: 5 mg via INTRAVENOUS
  Administered 2013-11-15: 3 mg via INTRAVENOUS
  Administered 2013-11-15: 2 mg via INTRAVENOUS

## 2013-11-15 MED ORDER — POTASSIUM CHLORIDE 10 MEQ/50ML IV SOLN
10.0000 meq | INTRAVENOUS | Status: AC
  Administered 2013-11-15 (×3): 10 meq via INTRAVENOUS

## 2013-11-15 MED ORDER — INSULIN REGULAR BOLUS VIA INFUSION
0.0000 [IU] | Freq: Three times a day (TID) | INTRAVENOUS | Status: DC
  Filled 2013-11-15: qty 10

## 2013-11-15 MED ORDER — DOCUSATE SODIUM 100 MG PO CAPS
200.0000 mg | ORAL_CAPSULE | Freq: Every day | ORAL | Status: DC
  Administered 2013-11-16: 200 mg via ORAL
  Filled 2013-11-15: qty 2

## 2013-11-15 MED ORDER — PHENYLEPHRINE HCL 10 MG/ML IJ SOLN
10.0000 mg | INTRAMUSCULAR | Status: DC | PRN
  Administered 2013-11-15: 50 ug/min via INTRAVENOUS

## 2013-11-15 MED ORDER — SODIUM CHLORIDE 0.9 % IJ SOLN: 3.0000 mL | INTRAMUSCULAR | Status: DC | PRN

## 2013-11-15 MED ORDER — DEXMEDETOMIDINE HCL IN NACL 200 MCG/50ML IV SOLN
0.1000 ug/kg/h | INTRAVENOUS | Status: DC
  Administered 2013-11-15: 0.7 ug/kg/h via INTRAVENOUS
  Filled 2013-11-15: qty 50

## 2013-11-15 MED ORDER — SODIUM CHLORIDE 0.9 % IV SOLN: 250.0000 mL | INTRAVENOUS | Status: DC

## 2013-11-15 MED ORDER — ACETAMINOPHEN 160 MG/5ML PO SOLN
1000.0000 mg | Freq: Four times a day (QID) | ORAL | Status: DC
  Filled 2013-11-15: qty 40

## 2013-11-15 MED ORDER — SODIUM CHLORIDE 0.45 % IV SOLN
INTRAVENOUS | Status: DC
  Administered 2013-11-15: 20 mL/h via INTRAVENOUS

## 2013-11-15 MED ORDER — MUPIROCIN 2 % EX OINT
TOPICAL_OINTMENT | CUTANEOUS | Status: AC
  Administered 2013-11-15: 1
  Filled 2013-11-15: qty 22

## 2013-11-15 MED ORDER — ACETAMINOPHEN 500 MG PO TABS
1000.0000 mg | ORAL_TABLET | Freq: Four times a day (QID) | ORAL | Status: DC
  Administered 2013-11-16 (×2): 1000 mg via ORAL
  Filled 2013-11-15 (×5): qty 2

## 2013-11-15 MED ORDER — ACETAMINOPHEN 650 MG RE SUPP
650.0000 mg | Freq: Once | RECTAL | Status: AC
  Administered 2013-11-15: 650 mg via RECTAL

## 2013-11-15 MED ORDER — METOPROLOL TARTRATE 12.5 MG HALF TABLET
12.5000 mg | ORAL_TABLET | Freq: Two times a day (BID) | ORAL | Status: DC
  Filled 2013-11-15 (×3): qty 1

## 2013-11-15 MED ORDER — PHENYLEPHRINE HCL 10 MG/ML IJ SOLN
0.0000 ug/min | INTRAVENOUS | Status: DC
  Filled 2013-11-15: qty 2

## 2013-11-15 MED ORDER — LACTATED RINGERS IV SOLN
INTRAVENOUS | Status: DC | PRN
  Administered 2013-11-15: 07:00:00 via INTRAVENOUS

## 2013-11-15 MED ORDER — THROMBIN 20000 UNITS EX SOLR
CUTANEOUS | Status: AC
  Filled 2013-11-15: qty 20000

## 2013-11-15 MED ORDER — SODIUM CHLORIDE 0.9 % IV SOLN
INTRAVENOUS | Status: DC
  Administered 2013-11-15: 20 mL/h via INTRAVENOUS

## 2013-11-15 MED ORDER — MIDAZOLAM HCL 2 MG/2ML IJ SOLN: 2.0000 mg | INTRAMUSCULAR | Status: DC | PRN

## 2013-11-15 MED ORDER — LACTATED RINGERS IV SOLN
INTRAVENOUS | Status: DC | PRN
  Administered 2013-11-15 (×2): via INTRAVENOUS

## 2013-11-15 MED ORDER — MAGNESIUM SULFATE 4000MG/100ML IJ SOLN
4.0000 g | Freq: Once | INTRAMUSCULAR | Status: AC
  Administered 2013-11-15: 4 g via INTRAVENOUS
  Filled 2013-11-15: qty 100

## 2013-11-15 MED ORDER — LACTATED RINGERS IV SOLN
INTRAVENOUS | Status: DC | PRN
  Administered 2013-11-15 (×2): via INTRAVENOUS

## 2013-11-15 MED ORDER — ALBUMIN HUMAN 5 % IV SOLN
250.0000 mL | INTRAVENOUS | Status: DC | PRN
  Administered 2013-11-15 (×2): 250 mL via INTRAVENOUS
  Filled 2013-11-15: qty 250

## 2013-11-15 MED ORDER — ALBUMIN HUMAN 5 % IV SOLN
INTRAVENOUS | Status: DC | PRN
  Administered 2013-11-15: 12:00:00 via INTRAVENOUS

## 2013-11-15 MED ORDER — MORPHINE SULFATE 2 MG/ML IJ SOLN
1.0000 mg | INTRAMUSCULAR | Status: DC | PRN
  Administered 2013-11-15 (×2): 2 mg via INTRAVENOUS
  Filled 2013-11-15 (×2): qty 1

## 2013-11-15 MED ORDER — METOPROLOL TARTRATE 25 MG/10 ML ORAL SUSPENSION
12.5000 mg | Freq: Two times a day (BID) | ORAL | Status: DC
  Administered 2013-11-15: 12.5 mg
  Filled 2013-11-15 (×3): qty 5

## 2013-11-15 MED ORDER — LIDOCAINE HCL (CARDIAC) 20 MG/ML IV SOLN
INTRAVENOUS | Status: DC | PRN
  Administered 2013-11-15: 80 mg via INTRAVENOUS

## 2013-11-15 MED ORDER — ACETAMINOPHEN 160 MG/5ML PO SOLN: 650.0000 mg | Freq: Once | ORAL | Status: AC

## 2013-11-15 MED ORDER — ROCURONIUM BROMIDE 100 MG/10ML IV SOLN
INTRAVENOUS | Status: DC | PRN
  Administered 2013-11-15: 50 mg via INTRAVENOUS

## 2013-11-15 MED ORDER — LACTATED RINGERS IV SOLN: 500.0000 mL | Freq: Once | INTRAVENOUS | Status: AC | PRN

## 2013-11-15 MED ORDER — HEPARIN SODIUM (PORCINE) 1000 UNIT/ML IJ SOLN
INTRAMUSCULAR | Status: DC | PRN
  Administered 2013-11-15: 26 mL via INTRAVENOUS

## 2013-11-15 MED ORDER — PROPOFOL 10 MG/ML IV BOLUS
INTRAVENOUS | Status: AC
  Filled 2013-11-15: qty 20

## 2013-11-15 MED ORDER — BISACODYL 10 MG RE SUPP: 10.0000 mg | Freq: Every day | RECTAL | Status: DC

## 2013-11-15 MED ORDER — SODIUM CHLORIDE 0.9 % IV SOLN
10.0000 g | INTRAVENOUS | Status: DC | PRN
  Administered 2013-11-15: 5 g/h via INTRAVENOUS

## 2013-11-15 MED ORDER — FENTANYL CITRATE 0.05 MG/ML IJ SOLN
INTRAMUSCULAR | Status: DC | PRN
  Administered 2013-11-15: 175 ug via INTRAVENOUS
  Administered 2013-11-15: 250 ug via INTRAVENOUS
  Administered 2013-11-15 (×2): 100 ug via INTRAVENOUS
  Administered 2013-11-15: 250 ug via INTRAVENOUS
  Administered 2013-11-15: 75 ug via INTRAVENOUS
  Administered 2013-11-15: 100 ug via INTRAVENOUS
  Administered 2013-11-15 (×2): 250 ug via INTRAVENOUS
  Administered 2013-11-15: 200 ug via INTRAVENOUS

## 2013-11-15 MED ORDER — ATORVASTATIN CALCIUM 80 MG PO TABS
80.0000 mg | ORAL_TABLET | Freq: Every day | ORAL | Status: DC
  Administered 2013-11-16 – 2013-11-18 (×3): 80 mg via ORAL
  Filled 2013-11-15 (×5): qty 1

## 2013-11-15 MED ORDER — INSULIN REGULAR HUMAN 100 UNIT/ML IJ SOLN
INTRAMUSCULAR | Status: DC
  Administered 2013-11-15: 0.3 [IU]/h via INTRAVENOUS
  Filled 2013-11-15 (×2): qty 1

## 2013-11-15 MED ORDER — THROMBIN 20000 UNITS EX SOLR
OROMUCOSAL | Status: DC | PRN
  Administered 2013-11-15 (×3): via TOPICAL

## 2013-11-15 MED ORDER — LACTATED RINGERS IV SOLN
INTRAVENOUS | Status: DC
  Administered 2013-11-15: 20 mL/h via INTRAVENOUS

## 2013-11-15 MED ORDER — OXYCODONE HCL 5 MG PO TABS
5.0000 mg | ORAL_TABLET | ORAL | Status: DC | PRN
  Administered 2013-11-16 (×2): 5 mg via ORAL
  Filled 2013-11-15 (×2): qty 1

## 2013-11-15 MED ORDER — NITROPRUSSIDE SODIUM 25 MG/ML IV SOLN
50000.0000 ug | INTRAVENOUS | Status: DC | PRN
  Administered 2013-11-15: .2 ug/kg/min via INTRAVENOUS

## 2013-11-15 MED ORDER — DARIFENACIN HYDROBROMIDE ER 7.5 MG PO TB24
7.5000 mg | ORAL_TABLET | Freq: Every day | ORAL | Status: DC
  Administered 2013-11-16 – 2013-11-18 (×3): 7.5 mg via ORAL
  Filled 2013-11-15 (×4): qty 1

## 2013-11-15 MED ORDER — STERILE WATER FOR INJECTION IJ SOLN
INTRAMUSCULAR | Status: AC
  Filled 2013-11-15: qty 10

## 2013-11-15 MED ORDER — ONDANSETRON HCL 4 MG/2ML IJ SOLN: 4.0000 mg | Freq: Four times a day (QID) | INTRAMUSCULAR | Status: DC | PRN

## 2013-11-15 MED ORDER — METOPROLOL TARTRATE 1 MG/ML IV SOLN: 2.5000 mg | INTRAVENOUS | Status: DC | PRN

## 2013-11-15 MED ORDER — HEPARIN SODIUM (PORCINE) 1000 UNIT/ML IJ SOLN
INTRAMUSCULAR | Status: AC
  Filled 2013-11-15: qty 1

## 2013-11-15 MED ORDER — PANTOPRAZOLE SODIUM 40 MG PO TBEC
40.0000 mg | DELAYED_RELEASE_TABLET | Freq: Every day | ORAL | Status: DC
  Filled 2013-11-15: qty 1

## 2013-11-15 MED ORDER — MIDAZOLAM HCL 10 MG/2ML IJ SOLN
INTRAMUSCULAR | Status: AC
  Filled 2013-11-15: qty 2

## 2013-11-15 MED ORDER — VECURONIUM BROMIDE 10 MG IV SOLR
INTRAVENOUS | Status: DC | PRN
  Administered 2013-11-15: 5 mg via INTRAVENOUS
  Administered 2013-11-15: 4 mg via INTRAVENOUS
  Administered 2013-11-15: 1 mg via INTRAVENOUS
  Administered 2013-11-15: 5 mg via INTRAVENOUS

## 2013-11-15 MED ORDER — DEXTROSE 5 % IV SOLN
20.0000 mg | INTRAVENOUS | Status: DC | PRN
  Administered 2013-11-15: 15 ug/min via INTRAVENOUS

## 2013-11-15 MED ORDER — MORPHINE SULFATE 2 MG/ML IJ SOLN
2.0000 mg | INTRAMUSCULAR | Status: DC | PRN
  Administered 2013-11-15 – 2013-11-16 (×5): 2 mg via INTRAVENOUS
  Filled 2013-11-15 (×5): qty 1

## 2013-11-15 MED ORDER — ARTIFICIAL TEARS OP OINT
TOPICAL_OINTMENT | OPHTHALMIC | Status: AC
  Filled 2013-11-15: qty 3.5

## 2013-11-15 MED ORDER — PROTAMINE SULFATE 10 MG/ML IV SOLN
INTRAVENOUS | Status: DC | PRN
  Administered 2013-11-15: 50 mg via INTRAVENOUS
  Administered 2013-11-15: 45 mg via INTRAVENOUS
  Administered 2013-11-15: 5 mg via INTRAVENOUS
  Administered 2013-11-15 (×2): 50 mg via INTRAVENOUS

## 2013-11-15 MED ORDER — PROPOFOL 10 MG/ML IV BOLUS
INTRAVENOUS | Status: DC | PRN
  Administered 2013-11-15: 110 mg via INTRAVENOUS

## 2013-11-15 SURGICAL SUPPLY — 72 items
ADAPTER CARDIO PERF ANTE/RETRO (ADAPTER) ×4 IMPLANT
ATTRACTOMAT 16X20 MAGNETIC DRP (DRAPES) ×4 IMPLANT
BAG DECANTER FOR FLEXI CONT (MISCELLANEOUS) ×4 IMPLANT
BLADE STERNUM SYSTEM 6 (BLADE) ×4 IMPLANT
BLADE SURG 15 STRL LF DISP TIS (BLADE) ×2 IMPLANT
BLADE SURG 15 STRL SS (BLADE) ×2
CANISTER SUCTION 2500CC (MISCELLANEOUS) ×4 IMPLANT
CANNULA GUNDRY RCSP 15FR (MISCELLANEOUS) ×4 IMPLANT
CATH ROBINSON RED A/P 18FR (CATHETERS) ×12 IMPLANT
CATH THORACIC 36FR (CATHETERS) ×4 IMPLANT
CATH THORACIC 36FR RT ANG (CATHETERS) ×4 IMPLANT
CONT SPEC STER OR (MISCELLANEOUS) ×4 IMPLANT
COVER SURGICAL LIGHT HANDLE (MISCELLANEOUS) ×4 IMPLANT
CRADLE DONUT ADULT HEAD (MISCELLANEOUS) ×4 IMPLANT
DRAPE SLUSH/WARMER DISC (DRAPES) ×4 IMPLANT
DRSG COVADERM 4X14 (GAUZE/BANDAGES/DRESSINGS) ×4 IMPLANT
DRSG COVADERM 4X8 (GAUZE/BANDAGES/DRESSINGS) ×4 IMPLANT
ELECT BLADE 4.0 EZ CLEAN MEGAD (MISCELLANEOUS) ×4
ELECT CAUTERY BLADE 6.4 (BLADE) ×4 IMPLANT
ELECT REM PT RETURN 9FT ADLT (ELECTROSURGICAL) ×8
ELECTRODE BLDE 4.0 EZ CLN MEGD (MISCELLANEOUS) ×2 IMPLANT
ELECTRODE REM PT RTRN 9FT ADLT (ELECTROSURGICAL) ×4 IMPLANT
GLOVE BIO SURGEON STRL SZ 6 (GLOVE) ×16 IMPLANT
GLOVE BIO SURGEON STRL SZ 6.5 (GLOVE) ×12 IMPLANT
GLOVE BIO SURGEON STRL SZ7 (GLOVE) ×8 IMPLANT
GLOVE BIO SURGEON STRL SZ7.5 (GLOVE) IMPLANT
GLOVE BIO SURGEONS STRL SZ 6.5 (GLOVE) ×4
GLOVE BIOGEL PI IND STRL 6 (GLOVE) ×4 IMPLANT
GLOVE BIOGEL PI IND STRL 6.5 (GLOVE) ×4 IMPLANT
GLOVE BIOGEL PI IND STRL 7.0 (GLOVE) ×8 IMPLANT
GLOVE BIOGEL PI IND STRL 7.5 (GLOVE) ×2 IMPLANT
GLOVE BIOGEL PI INDICATOR 6 (GLOVE) ×4
GLOVE BIOGEL PI INDICATOR 6.5 (GLOVE) ×4
GLOVE BIOGEL PI INDICATOR 7.0 (GLOVE) ×8
GLOVE BIOGEL PI INDICATOR 7.5 (GLOVE) ×2
GLOVE EUDERMIC 7 POWDERFREE (GLOVE) ×8 IMPLANT
GOWN STRL REUS W/ TWL LRG LVL3 (GOWN DISPOSABLE) ×24 IMPLANT
GOWN STRL REUS W/ TWL XL LVL3 (GOWN DISPOSABLE) ×2 IMPLANT
GOWN STRL REUS W/TWL LRG LVL3 (GOWN DISPOSABLE) ×24
GOWN STRL REUS W/TWL XL LVL3 (GOWN DISPOSABLE) ×2
HEART VENT LT CURVED (MISCELLANEOUS) ×4 IMPLANT
HEMOSTAT POWDER SURGIFOAM 1G (HEMOSTASIS) ×12 IMPLANT
HEMOSTAT SURGICEL 2X14 (HEMOSTASIS) ×4 IMPLANT
KIT BASIN OR (CUSTOM PROCEDURE TRAY) ×4 IMPLANT
KIT CATH CPB BARTLE (MISCELLANEOUS) ×4 IMPLANT
KIT ROOM TURNOVER OR (KITS) ×4 IMPLANT
KIT SUCTION CATH 14FR (SUCTIONS) ×4 IMPLANT
LINE VENT (MISCELLANEOUS) ×4 IMPLANT
NS IRRIG 1000ML POUR BTL (IV SOLUTION) ×24 IMPLANT
PACK OPEN HEART (CUSTOM PROCEDURE TRAY) ×4 IMPLANT
PAD ARMBOARD 7.5X6 YLW CONV (MISCELLANEOUS) ×8 IMPLANT
PENCIL BUTTON HOLSTER BLD 10FT (ELECTRODE) ×4 IMPLANT
SET CARDIOPLEGIA MPS 5001102 (MISCELLANEOUS) ×4 IMPLANT
SPONGE GAUZE 4X4 12PLY (GAUZE/BANDAGES/DRESSINGS) ×4 IMPLANT
SUT BONE WAX W31G (SUTURE) ×4 IMPLANT
SUT ETHIBON 2 0 V 52N 30 (SUTURE) ×8 IMPLANT
SUT PROLENE 3 0 SH DA (SUTURE) IMPLANT
SUT PROLENE 3 0 SH1 36 (SUTURE) ×4 IMPLANT
SUT PROLENE 4 0 RB 1 (SUTURE) ×6
SUT PROLENE 4-0 RB1 .5 CRCL 36 (SUTURE) ×6 IMPLANT
SUT STEEL 6MS V (SUTURE) ×8 IMPLANT
SUT VIC AB 1 CTX 27 (SUTURE) ×8 IMPLANT
SUT VIC AB 1 CTX 36 (SUTURE) ×4
SUT VIC AB 1 CTX36XBRD ANBCTR (SUTURE) ×4 IMPLANT
SUTURE E-PAK OPEN HEART (SUTURE) ×4 IMPLANT
SYSTEM SAHARA CHEST DRAIN ATS (WOUND CARE) ×4 IMPLANT
TOWEL OR 17X24 6PK STRL BLUE (TOWEL DISPOSABLE) ×8 IMPLANT
TOWEL OR 17X26 10 PK STRL BLUE (TOWEL DISPOSABLE) ×8 IMPLANT
TRAY FOLEY IC TEMP SENS 16FR (CATHETERS) ×4 IMPLANT
UNDERPAD 30X30 INCONTINENT (UNDERPADS AND DIAPERS) ×4 IMPLANT
VALVE MAGNA EASE AORTIC 25MM (Prosthesis & Implant Heart) ×4 IMPLANT
WATER STERILE IRR 1000ML POUR (IV SOLUTION) ×8 IMPLANT

## 2013-11-15 NOTE — Transfer of Care (Signed)
Immediate Anesthesia Transfer of Care Note  Patient: Yasin Ducat  Procedure(s) Performed: Procedure(s): AORTIC VALVE REPLACEMENT (AVR) (N/A) INTRAOPERATIVE TRANSESOPHAGEAL ECHOCARDIOGRAM (N/A)  Patient Location: SICU  Anesthesia Type:General  Level of Consciousness: sedated and Patient remains intubated per anesthesia plan  Airway & Oxygen Therapy: Patient remains intubated per anesthesia plan and Patient placed on Ventilator (see vital sign flow sheet for setting)  Post-op Assessment: Report given to PACU RN and Post -op Vital signs reviewed and stable  Post vital signs: Reviewed and stable  Complications: No apparent anesthesia complications

## 2013-11-15 NOTE — Interval H&P Note (Signed)
History and Physical Interval Note:  11/15/2013 7:26 AM  Willie Freeman  has presented today for surgery, with the diagnosis of SEVERE AS SEVERE AI  The various methods of treatment have been discussed with the patient and family. After consideration of risks, benefits and other options for treatment, the patient has consented to  Procedure(s): AORTIC VALVE REPLACEMENT (AVR) (N/A) INTRAOPERATIVE TRANSESOPHAGEAL ECHOCARDIOGRAM (N/A) as a surgical intervention .  The patient's history has been reviewed, patient examined, no change in status, stable for surgery.  I have reviewed the patient's chart and labs.  Questions were answered to the patient's satisfaction.     Gaye Pollack

## 2013-11-15 NOTE — Progress Notes (Signed)
S/p AVR  Extubated, alert  BP 111/64  Pulse 72  Temp(Src) 97.5 F (36.4 C) (Core (Comment))  Resp 9  Ht 5\' 10"  (1.778 m)  Wt 170 lb 10.2 oz (77.4 kg)  BMI 24.48 kg/m2  SpO2 100%   Intake/Output Summary (Last 24 hours) at 11/15/13 1805 Last data filed at 11/15/13 1800  Gross per 24 hour  Intake 4436.55 ml  Output   4305 ml  Net 131.55 ml   Good index, minimal CT output

## 2013-11-15 NOTE — Anesthesia Postprocedure Evaluation (Deleted)
Anesthesia Post Note  Patient: Willie Freeman  Procedure(s) Performed: Procedure(s) (LRB): AORTIC VALVE REPLACEMENT (AVR) (N/A) INTRAOPERATIVE TRANSESOPHAGEAL ECHOCARDIOGRAM (N/A)  Anesthesia type: General  Patient location: ICU  Post pain: Pain level controlled  Post assessment: Post-op Vital signs reviewed  Last Vitals:  Filed Vitals:   11/15/13 0549  BP: 174/67  Pulse: 51  Temp: 36.6 C  Resp: 16    Post vital signs: stable  Level of consciousness: Patient remains intubated per anesthesia plan  Complications: No apparent anesthesia complications

## 2013-11-15 NOTE — Op Note (Signed)
CARDIOVASCULAR SURGERY OPERATIVE NOTE  11/15/2013 Willie Freeman [id number]  Surgeon:  Gaye Pollack, MD  First Assistant: Suzzanne Cloud,  PA-C   Preoperative Diagnosis:  Severe aortic stenosis   Postoperative Diagnosis:  Same   Procedure:  1. Median Sternotomy 2. Extracorporeal circulation 3.   Aortic valve replacement using a 25 mm  Edwards Magna-Ease Pericardial valve.  Anesthesia:  General Endotracheal   Clinical History/Surgical Indication:  The patient is a 68 year old gentleman in previously good health who does not go to a physician routinely and has not been in many years. He awoke on the evening of 11/01/2013 with chest pressure, feeling warm, and having a headache and was taken to [facility] where he was evaluated for an acute coronary syndrome with nonspecific anterior ST changes. He was noted to have confusion and right sided weakness. He was transferred to [facility] as a code stroke and was evaluated by neurology. He was felt to have a left brain TIA. He continued to have chest discomfort off and on with subtle dynamic ECG changes. He had a positive troponin of 0.57 that peaked at 6.96. He had a brain MRI that showed an acut punctate nonhemorrhagic infarct in the high right frontal cortex as well as remote lacunar infarcts within the caudate head and cerebellum bilaterally. A TEE showed normal LVEF of 55-60% with no wall motion abnormalities. The aortic valve is bicuspid and severely calcified with moderate to severe AI. The mean gradient is 44 and the peak is 66 with an AVA of 0.86. He is now back to normal with resolution of his neurologic and chest pain symptoms. 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 I suspect this is all embolic due to debri from the aortic valve. I agree with the need for AVR. He is stable at this time with complete  resolution of his neurologic symptoms and chest pain. I discussed the operative procedure with the patient and family including alternatives, benefits and risks; including but not limited to bleeding, blood transfusion, infection, stroke, myocardial infarction, heart block requiring a permanent pacemaker, organ dysfunction, and death. Willie Freeman understands and agrees to proceed. I would recommend using a tissue valve at his age and the current ACC guidelines recommend a tissue valve for patients 50 and older. He is in agreement with that.     Preparation:  The patient was seen in the preoperative holding area and the correct patient, correct operation were confirmed with the patient after reviewing the medical record and catheterization. The consent was signed by me. Preoperative antibiotics were given. A pulmonary arterial line and radial arterial line were placed by the anesthesia team. The patient was taken back to the operating room and positioned supine on the operating room table. After being placed under general endotracheal anesthesia by the anesthesia team a foley catheter was placed. The neck, chest, abdomen, and both legs were prepped with betadine soap and solution and draped in the usual sterile manner. A surgical time-out was taken and the correct patient and operative procedure were confirmed with the nursing and anesthesia staff.   Pre-bypass TEE:   Complete TEE assessment was performed by Dr. Tamela Gammon.  *[facility]* [place], [place] [address] [phone]  ------------------------------------------------------------------- Intraoperative Transesophageal Echocardiography  Patient: Willie, Freeman MR #: [id number] Study Date: 11/15/2013 Gender: M Age: 55 Height: Weight:  BSA: Pt. Status: Room: 2S06C  ADMITTING Gilford Raid, MD ATTENDING Gilford Raid, MD Thomas, MD REFERRING Gilford Raid, MD PERFORMING  Duane Boston, MD SONOGRAPHER Jimmy Reel, RDCS  cc:  ------------------------------------------------------------------- LV EF: 55% - 60%  ------------------------------------------------------------------- Study Conclusions  - Left ventricle: The cavity size was normal. Wall thickness was increased in a pattern of severe LVH. There was concentric hypertrophy. Systolic function was normal. The estimated ejection fraction was in the range of 55% to 60%. Wall motion was normal; there were no regional wall motion abnormalities. - Aortic valve: Normal-sized, severely calcified annulus. Severely thickened, severely calcified leaflets. Cusp separation was severely reduced. Valve mobility was restricted. There was critical stenosis. There was moderate to severe regurgitation directed eccentrically in the LVOT and towards the mitral anterior leaflet.  Impressions:  - Excellent prosthetic valve function without evidence for perivalvular leak. Other valves unchanged. No other change from pre-bypass images.  -------------------------------------------------------------------  ------------------------------------------------------------------- Left ventricle: The cavity size was normal. Wall thickness was increased in a pattern of severe LVH. There was concentric hypertrophy. Systolic function was normal. The estimated ejection fraction was in the range of 55% to 60%. Wall motion was normal; there were no regional wall motion abnormalities.  ------------------------------------------------------------------- Aortic valve: Normal-sized, severely calcified annulus. Severely thickened, severely calcified leaflets. Cusp separation was severely reduced. Valve mobility was restricted. Doppler: There was critical stenosis. There was moderate to severe regurgitation directed eccentrically in the LVOT and towards the mitral  anterior leaflet.  ------------------------------------------------------------------- Aorta: The aorta was mildly calcified.  ------------------------------------------------------------------- Mitral valve: Mildly thickened leaflets . Leaflet separation was normal. No evidence of vegetation. Doppler: There was trivial regurgitation.  ------------------------------------------------------------------- Left atrium: The atrium was normal in size. No evidence of thrombus in the atrial cavity or appendage.  ------------------------------------------------------------------- Atrial septum: No defect or patent foramen ovale was identified.  ------------------------------------------------------------------- Right ventricle: The cavity size was normal. Wall thickness was normal. Systolic function was normal.  ------------------------------------------------------------------- Pulmonic valve: Structurally normal valve. Cusp separation was normal. No evidence of vegetation. Doppler: There was trivial regurgitation.  ------------------------------------------------------------------- Tricuspid valve: Structurally normal valve. Leaflet separation was normal. No evidence of vegetation. Doppler: There was no regurgitation.  ------------------------------------------------------------------- Right atrium: The atrium was normal in size. No evidence of thrombus in the atrial cavity or appendage.  ------------------------------------------------------------------- Pericardium: The pericardium was normal in appearance. There was no pericardial effusion.  ------------------------------------------------------------------- Pre bypass:  Post bypass:  ------------------------------------------------------------------- Post procedure conclusions Left ventricle: Good LVEF. Aortic valve:  - Prosthetic valve well seated in the Aortic annulus. Post replacement images demonstrate no residual  valvular insufficiency or perivalvular leak.  Ascending Aorta:  - The aorta was mildly calcified.  ------------------------------------------------------------------- Prepared and Electronically Authenticated by  Duane Boston, MD 2015-05-26T14:26:08     Post-bypass TEE:   Normal functioning prosthetic aortic valve with no perivalvular leak or regurgitation through the valve. Left ventricular function preserved. No mitral regurgitation.    Cardiopulmonary Bypass:  A median sternotomy was performed. The pericardium was opened in the midline. Right ventricular function appeared normal. The ascending aorta was of normal size and had no palpable plaque. There were no contraindications to aortic cannulation or cross-clamping. The patient was fully systemically heparinized and the ACT was maintained > 400 sec. The proximal aortic arch was cannulated with a 20 F aortic cannula for arterial inflow. Venous cannulation was performed via the right atrial appendage using a two-staged venous cannula. An antegrade cardioplegia/vent cannula was inserted into the mid-ascending aorta. A left ventricular vent was  placed via the right superior pulmonary vein. A retrograde cardioplegia cannnula was placed into the coronary sinus via the right atrium. Aortic occlusion was performed with a single cross-clamp. Systemic cooling to 32 degrees Centigrade and topical cooling of the heart with iced saline were used. Hyperkalemic antegrade and retrograde cold blood cardioplegia was used to induce diastolic arrest and then cold blood retrograde cardioplegia was given at about 20 minute intervals throughout the period of arrest to maintain myocardial temperature at or below 10 degrees centigrade. A temperature probe was inserted into the interventricular septum and an insulating pad was placed in the pericardium. Carbon dioxide was insufflated into the pericardium at 5L/min throughout the procedure to minimize intracardiac  air.   Aortic Valve Replacement:   A transverse aortotomy was performed 1 cm above the take-off of the right coronary artery. The native valve was bicuspid with fusion of the left and right cusps and 3 commissures.  There were very calcified leaflets and severe annular calcification. There was an ulcerated area at the fused commissure with calcified debris that could easily have been the area where the emboli originated. The ostia of the coronary arteries were in normal position and were not obstructed. The native valve leaflets were excised and the annulus was decalcified with rongeurs. Care was taken to remove all particulate debris. The left ventricle was directly inspected for debris and then irrigated with ice saline solution. The annulus was sized and a size 25 mm Edwards Magna-Ease pericardial valve was chosen. The model number was [id number] and the serial number was [id number]. While the valve was being prepared 2-0 Ethibond pledgeted horizontal mattress sutures were placed around the annulus with the pledgets in a sub-annular position. The sutures were placed through the sewing ring and the valve lowered into place. The sutures were tied sequentially. The valve seated nicely and the coronary ostia were not obstructed. The prosthetic valve leaflets moved normally and there was no sub-valvular obstruction. The aortotomy was closed using 4-0 Prolene suture in 2 layers with felt strips to reinforce the closure.  Completion:  The patient was rewarmed to 37 degrees Centigrade. De-airing maneuvers were performed and the head placed in trendelenburg position. The crossclamp was removed with a time of 83 minutes. There was spontaneous return of ventricular tachycardia and the patient was defibrillated into sinus rhythm. The aortotomy was checked for hemostasis. Two temporary epicardial pacing wires were placed on the right atrium and two on the right ventricle. The left ventricular vent and retrograde  cardioplegia cannulas were removed. The patient was weaned from CPB without difficulty on no inotropes. CPB time was 120 minutes. Cardiac output was 5 LPM. Heparin was fully reversed with protamine and the aortic and venous cannulas removed. Hemostasis was achieved. Mediastinal drainage tubes were placed. The sternum was closed with  #6 stainless steel wires. The fascia was closed with continuous # 1 vicryl suture. The subcutaneous tissue was closed with 2-0 vicryl continuous suture. The skin was closed with 3-0 vicryl subcuticular suture. All sponge, needle, and instrument counts were reported correct at the end of the case. Dry sterile dressings were placed over the incisions and around the chest tubes which were connected to pleurevac suction. The patient was then transported to the surgical intensive care unit in critical but stable condition.

## 2013-11-15 NOTE — Anesthesia Postprocedure Evaluation (Signed)
  Anesthesia Post-op Note  Patient: Willie Freeman  Procedure(s) Performed: Procedure(s): AORTIC VALVE REPLACEMENT (AVR) (N/A) INTRAOPERATIVE TRANSESOPHAGEAL ECHOCARDIOGRAM (N/A)  Patient Location: SICU  Anesthesia Type:General  Level of Consciousness: sedated and Patient remains intubated per anesthesia plan  Airway and Oxygen Therapy: Patient remains intubated per anesthesia plan and Patient placed on Ventilator (see vital sign flow sheet for setting)  Post-op Pain: patient sedated  Post-op Assessment: Post-op Vital signs reviewed, Patient's Cardiovascular Status Stable and Respiratory Function Stable  Post-op Vital Signs: Reviewed and stable  Last Vitals:  Filed Vitals:   11/15/13 0549  BP: 174/67  Pulse: 51  Temp: 36.6 C  Resp: 16    Complications: No apparent anesthesia complications

## 2013-11-15 NOTE — Brief Op Note (Signed)
11/15/2013  10:39 AM  PATIENT:  Willie Freeman  68 y.o. male  PRE-OPERATIVE DIAGNOSIS:  SEVERE AS, SEVERE AI  POST-OPERATIVE DIAGNOSIS:  SEVERE AS, SEVERE AI  PROCEDURE:   AORTIC VALVE REPLACEMENT (25 mm Edwards Magna Ease pericardial tissue valve)  SURGEON:  Gaye Pollack, MD  ASSISTANT: Suzzanne Cloud, PA-C  ANESTHESIA:   general  PATIENT CONDITION:  ICU - intubated and hemodynamically stable.  PRE-OPERATIVE WEIGHT: 77 kg   Aortic Valve Etiology   Aortic Insufficiency:  Severe  Aortic Valve Disease:  Yes.  Aortic Stenosis:  Yes. Smallest Aortic Valve Area: 0.86cm2; Highest Mean Gradient: 53mmHg.  Etiology (Choose at least one and up to  5 etiologies):  Bicuspid valve disease and Degenerative - Calcified  Aortic Valve  Procedure Performed:  Replacement: Yes.  Bioprosthetic Valve. Implant Model Number:[id number], Size:25, Unique Device Identifier:[id number]  Repair/Reconstruction: No.   Aortic Annular Enlargement: No.

## 2013-11-15 NOTE — Progress Notes (Signed)
  Echocardiogram Echocardiogram Transesophageal has been performed.  Willie Freeman 11/15/2013, 12:02 PM

## 2013-11-15 NOTE — OR Nursing (Signed)
First Call to SICU at 1107, spoke with nursing secretary.

## 2013-11-15 NOTE — H&P (Signed)
[address]       [place] [address]             [phone]      Cardiothoracic Surgery History and Physical    [facility] Medical Record [id number]  Date of Birth: 03-04-1946  Primary Cardiologist: Dr. Clayton Lefort  Referring physician: Dr. Adrian Prows  Chief Complaint: Severe bicuspid aortic valve stenosis with acute stroke and myocardial ischemia.  History of Present Illness:  The patient is a 68 year old gentleman in previously good health who does not go to a physician routinely and has not been in many years. He awoke on the evening of 11/01/2013 with chest pressure, feeling warm, and having a headache and was taken to [facility] where he was evaluated for an acute coronary syndrome with nonspecific anterior ST changes. He was noted to have confusion and right sided weakness. He was transferred to [facility] as a code stroke and was evaluated by neurology. He was felt to have a left brain TIA. He continued to have chest discomfort off and on with subtle dynamic ECG changes. He had a positive troponin of 0.57 that peaked at 6.96. He had a brain MRI that showed an acut punctate nonhemorrhagic infarct in the high right frontal cortex as well as remote lacunar infarcts within the caudate head and cerebellum bilaterally. A TEE showed normal LVEF of 55-60% with no wall motion abnormalities. The aortic valve is bicuspid and severely calcified with moderate to severe AI. The mean gradient is 44 and the peak is 66 with an AVA of 0.86. He is now back to normal with resolution of his neurologic and chest pain symptoms.  Current Activity/ Functional Status:  Patient is independent with mobility/ambulation, transfers, ADL's, IADL's.  Past Medical History   Diagnosis  Date   .  Back pain     Past Surgical History   Procedure  Laterality  Date   .  Hernia repair      History   Smoking status   .  Former Smoker   Smokeless tobacco   .  Not on file     History   Alcohol Use   .  Yes     Comment: Rarely    History    Social History   .  Marital Status:  Married     Spouse Name:  N/A     Number of Children:  N/A   .  Years of Education:  N/A    Occupational History   .  Not on file.    Social History Main Topics   .  Smoking status:  Former Research scientist (life sciences)   .  Smokeless tobacco:  Not on file   .  Alcohol Use:  Yes      Comment: Rarely   .  Drug Use:  Not on file   .  Sexual Activity:  Not on file    Other Topics  Concern   .  Not on file    Social History Narrative   .  No narrative on file   No Known Allergies  Current Outpatient Prescriptions   Medication  Sig  Dispense  Refill   .  aspirin EC 325 MG EC tablet  Take 1 tablet (325 mg total) by mouth daily.     Marland Kitchen  atorvastatin (LIPITOR) 80 MG tablet  Take 1 tablet (80 mg total) by mouth daily at 6 PM.  30 tablet  1   .  finasteride (PROSCAR) 5 MG tablet  Take 5 mg by mouth daily.     .  metoprolol tartrate (LOPRESSOR) 25 MG tablet  Take 0.5 tablets (12.5 mg total) by mouth 2 (two) times daily.  60 tablet  1   .  tamsulosin (FLOMAX) 0.4 MG CAPS capsule  Take 0.4 mg by mouth daily.     .  VESICARE 5 MG tablet  Take 5 mg by mouth daily.      No current facility-administered medications for this visit.    Facility-Administered Medications Ordered in Other Visits   Medication  Dose  Route  Frequency  Provider  Last Rate  Last Dose   .  0.9 % sodium chloride infusion  250 mL  Intravenous  PRN  Laverda Page, MD   250 mL at 11/02/13 0700   .  aspirin EC tablet 81 mg  81 mg  Oral  Daily  Dorian Pod, MD   81 mg at 11/04/13 1036   .  atorvastatin (LIPITOR) tablet 80 mg  80 mg  Oral  q1800  Laverda Page, MD   80 mg at 11/04/13 1800   .  heparin injection 5,000 Units  5,000 Units  Subcutaneous  3 times per day  Laverda Page, MD   5,000 Units at 11/04/13 1551   .  metoprolol tartrate (LOPRESSOR) tablet 12.5 mg  12.5 mg  Oral  BID  Laverda Page, MD   12.5 mg at  11/04/13 1036   .  nitroGLYCERIN (NITROSTAT) SL tablet 0.4 mg  0.4 mg  Sublingual  Q5 min PRN  Hoy Morn, MD   0.4 mg at 11/02/13 0405   .  ondansetron (ZOFRAN) injection 4 mg  4 mg  Intravenous  Q6H PRN  Laverda Page, MD     .  sodium chloride 0.9 % injection 3 mL  3 mL  Intravenous  Q12H  Laverda Page, MD   3 mL at 11/04/13 1036   .  sodium chloride 0.9 % injection 3 mL  3 mL  Intravenous  PRN  Laverda Page, MD     Family Hx:  Strong family history of heart disease but he does not know specifics. No definite history of aortic valve or aortic aneurysm disease  Review of Systems:  Cardiac Review of Systems: Y or N  Chest Pain [ y ] Resting SOB [n ] Exertional SOB [ n ] Orthopnea [n ]  Pedal Edema Florencio.Farrier ] Palpitations [ n ] Syncope [n ] Presyncope [n ]  General Review of Systems: [Y] = yes [ ] =no  Constitional: recent weight change [n]; anorexia [ n ]; fatigue [ y]; nausea [n ]; night sweats [n ]; fever [n ]; or chills [ n ];  Dental: poor dentition[n ];  Eye : blurred vision [n ]; diplopia Florencio.Farrier ]; vision changes [ n ]; Amaurosis fugax[ n];  Resp: cough [n ]; wheezing[ n ]; hemoptysis[ n ]; shortness of breath[ n ]; paroxysmal nocturnal dyspnea[ n ]; dyspnea on exertion[ n; or orthopnea[ n ];  GI: gallstones[ n], vomiting[ n ]; dysphagia[n]; melena[ n ]; hematochezia [n ]; heartburn[ n ]; Hx of Colonoscopy[ n];  GU: kidney stones [n ]; hematuria[n ]; dysuria [n ]; nocturia[ n]; history of obstruction [n ];  Skin: rash, swelling[ n ];, hair loss[n]; peripheral edema[n]; or itching[ n ];  Musculosketetal: myalgias[ n ]; joint swelling[ n ]; joint  erythema[ n ];  joint pain[ n ]; back pain[ n ];  Heme/Lymph: bruising[ n ]; bleeding[ n; anemia[ n ];  Neuro: TIA[ n ]; headaches[ y]; stroke[ y]; vertigo[ n ]; seizures[ n ]; paresthesias[ n]; difficulty walking[n ];  Psych:depression[ n]; anxiety[ n ];  Endocrine: diabetes[ n thyroid dysfunction[ n ]  Immunizations: Flu [ n ];  Pneumococcal[ n ];  Other:  Physical Exam:  There were no vitals taken for this visit.  General appearance: alert and cooperative  HEENT: [place]/AT, EOMI , oropharynx clear  Neck: carotid pulses with slow upstroke. Transmitted murmur to both sides of neck  Cardiac: RRR, 3/6 systolic murmur at RSB with 2/6 diastolic murmur at RSB radiating to apex  Lungs: clear  Abdomen: bowel sounds normal, nontender, no masses or organomegaly  Ext: no edema, pedal pulses palpable.  Neuro: Alert and oriented x 3 , motor and sensory intact  Diagnostic Studies & Laboratory data:  *[facility]* [address] [place], [place] [address] [phone]  ------------------------------------------------------------ Transesophageal Echocardiography  Patient: Scot, Ensor MR #: [id number] Study Date: 11/03/2013 Gender: M Age: 57 Height: 180.3cm Weight: 77.7kg BSA: 1.67m^2 Pt. Status: Room: [place]  PERFORMING Adrian Prows SONOGRAPHER Tresa Res, RDCS ADMITTING Kela Millin ATTENDING Kela Millin Deatra Robinson, Cammy Brochure Kingsley Callander cc:  ------------------------------------------------------------ LV EF: 55% - 60%  ------------------------------------------------------------ Indications: Aortic stenosis /insufficiency 424.1.  ------------------------------------------------------------ Study Conclusions  - Left ventricle: The cavity size was normal. Wall thickness was increased in a pattern of severe LVH. There was concentric hypertrophy. The estimated ejection fraction was in the range of 55% to 60%. Wall motion was normal; there were no regional wall motion abnormalities. - Aortic valve: Normal-sized, mildly to moderately calcified annulus. Bicuspid; severely thickened, severely calcified leaflets; fusion of the right-noncoronary commissure. Transvalvular velocity was increased. There was severe stenosis. Moderate to severe regurgitation  directed eccentrically in the LVOT. Mean gradient: 38mm Hg (S). Peak gradient: 22mm Hg (S). Valve area: 0.86cm^2(VTI). Valve area: 1.04cm^2 (Vmax). Regurgitation pressure half-time:259ms. - Mitral valve: Mildly calcified annulus. - Left atrium: The atrium was moderately dilated. No evidence of thrombus in the atrial cavity or appendage. - Right atrium: No evidence of thrombus in the atrial cavity or appendage. - Atrial septum: No defect or patent foramen ovale was identified. Echo contrast study showed no right-to-left atrial level shunt, following an increase in RA pressure induced by provocative maneuvers. - Tricuspid valve: No evidence of vegetation. - Pulmonic valve: No evidence of vegetation. Transesophageal echocardiography. 2D and color Doppler. Height: Height: 180.3cm. Height: 71in. Weight: Weight: 77.7kg. Weight: 171lb. Body mass index: BMI: 23.9kg/m^2. Body surface area: BSA: 1.68m^2. Blood pressure: 188/76. Patient status: Inpatient. Location: Bedside.  ------------------------------------------------------------  ------------------------------------------------------------ Left ventricle: The cavity size was normal. Wall thickness was increased in a pattern of severe LVH. There was concentric hypertrophy. The estimated ejection fraction was in the range of 55% to 60%. Wall motion was normal; there were no regional wall motion abnormalities.  ------------------------------------------------------------ Aortic valve: Well visualized. Normal-sized, mildly to moderately calcified annulus. Bicuspid; severely thickened, severely calcified leaflets; fusion of the right-noncoronary commissure. Doppler: Transvalvular velocity was increased. There was severe stenosis. Moderate to severe regurgitation directed eccentrically in the LVOT. VTI ratio of LVOT to aortic valve: 0.17. Valve area: 0.86cm^2(VTI). Indexed valve area: 0.43cm^2/m^2 (VTI). Peak velocity ratio of LVOT  to aortic valve: 0.21. Valve area: 1.04cm^2 (Vmax). Indexed valve area: 0.53cm^2/m^2 (Vmax). Mean gradient: 59mm Hg (S). Peak gradient: 51mm Hg (S).  ------------------------------------------------------------ Aorta:  The aorta was not dilated and non-calcified. Mild fibrocalcific change of the root was noted.  ------------------------------------------------------------ Mitral valve: Mildly calcified annulus. Doppler: Trivial regurgitation.  ------------------------------------------------------------ Left atrium: The atrium was moderately dilated. No evidence of thrombus in the atrial cavity or appendage. The appendage was well visualized and morphologically a left appendage. Emptying velocity was normal.  ------------------------------------------------------------ Atrial septum: No defect or patent foramen ovale was identified. Echo contrast study showed no right-to-left atrial level shunt, following an increase in RA pressure induced by provocative maneuvers.  ------------------------------------------------------------ Right ventricle: The cavity size was normal. Wall thickness was normal. Systolic function was normal.  ------------------------------------------------------------ Pulmonic valve: Structurally normal valve. Cusp separation was normal. No evidence of vegetation. Doppler: Trivial regurgitation.  ------------------------------------------------------------ Tricuspid valve: Structurally normal valve. Leaflet separation was normal. No evidence of vegetation. Doppler: Trivial regurgitation.  ------------------------------------------------------------ Pulmonary artery: The main pulmonary artery was normal-sized.  ------------------------------------------------------------ Right atrium: The atrium was normal in size. No evidence of thrombus in the atrial cavity or appendage.  ------------------------------------------------------------ Pericardium: The  pericardium was normal in appearance. There was no pericardial effusion.  ------------------------------------------------------------ Post procedure conclusions Ascending Aorta:  - The aorta was not dilated and non-calcified.  ------------------------------------------------------------  2D measurements Normal Doppler measurements Norma LVOT l Diam, S 25 mm ------ LVOT Area 4.91 cm^2 ------ Peak vel, 86. cm/s ----- S 2 VTI, S 16. cm ----- 8 Aortic valve Peak vel, 407 cm/s ----- S Mean vel, 311 cm/s ----- S VTI, S 96. cm ----- 4 Mean 44 mm Hg ----- gradient, S Peak 66 mm Hg ----- gradient, S VTI ratio 0.1 ----- LVOT/AV 7 Area, VTI 0.8 cm^2 ----- 6 Area 0.4 cm^2/m^2 ----- index 3 (VTI) Peak vel 0.2 ----- ratio, 1 LVOT/AV Area, 1.0 cm^2 ----- Vmax 4 Area 0.5 cm^2/m^2 ----- index 3 (Vmax) Regurg 701 ms ----- PHT  ------------------------------------------------------------ Prepared and Electronically Authenticated by  Adrian Prows 2015-05-15T08:15:57.363  Left heart catheterization including hemodynamic monitoring of the left ventricle, LV gram, selective right and left coronary arteriography.  Indication patient is a 68 year-old Caucasian male with no significant prior cardiovascular history who presents with non-ST elevation myocardial infarction with positive cardiac markers and dynamic EKG changes earlier this morning, also had TIA-like symptoms, due to ongoing chest discomfort he was brought emergently to the cardiac catheterization lab to evaluate his coronary anatomy. His physical examination was abnormal history of moderately severe aortic regurgitation along with aortic stenosis.  Hemodynamic data:  Left ventricular pressure was 190/30 with LVEDP of 40 mm mercury. Aortic pressure was 122/71 with a mean of 96 mm mercury. There was 69 mm Hg peak-peak, mean gradient of 47 mmHg, consistent with severe aortic stenosis. The gradient probably worse due to  presence of aortic regurgitation.  Ascending aortogram: The aortic valve was moderate to severely calcified, appeared to be bicuspid, there is severe aortic regurgitation.  Left ventricle: Performed in the RAO projection revealed LVEF of 60%. Marked LVH. There was No significant MR. No wall motion abnormality.  Right coronary artery: The vessel is smooth, normal, Dominant.  Left main coronary artery is large and normal.  Circumflex coronary artery: A large vessel giving origin to a large obtuse marginal 1. It is smooth and normal.  LAD: LAD gives origin to a large sized D1, moderate D2 and a moderate size D3. LAD is a large-caliber vessel. There was no significant luminal obstruction. Minimal calcification is evident in the proximal segment.  Impression: Suspect TIA-like symptoms could be related to degenerated and calcified aortic  valve, patient has probably severe aortic regurgitation and moderate aortic stenosis, will need TEE to further delineate his valve, he'll be observed for recurrence of TIA-like symptoms and also his cardiac markers will be trended.  Recent Radiology Findings:  No results found.  Recent Lab Findings:  Lab Results   Component  Value  Date    WBC  4.6  11/04/2013    HGB  12.5*  11/04/2013    HCT  37.9*  11/04/2013    PLT  130*  11/04/2013    GLUCOSE  119*  11/02/2013    CHOL  157  11/03/2013    TRIG  113  11/03/2013    HDL  37*  11/03/2013    LDLCALC  97  11/03/2013    ALT  16  11/02/2013    AST  18  11/02/2013    NA  143  11/02/2013    K  3.9  11/02/2013    CL  107  11/02/2013    CREATININE  0.90  11/02/2013    BUN  18  11/02/2013    CO2  26  11/02/2013    INR  0.96  11/02/2013    HGBA1C  5.6  11/02/2013   Assessment / Plan:  He has severe bicuspid aortic valve stenosis with moderate to severe AI presenting with an acute stroke and evidence of myocardial ischemia with markedly positive troponin. I suspect this is all embolic due to debri from the aortic valve. I agree with the  need for AVR. He is stable at this time with complete resolution of his neurologic symptoms and chest pain. I discussed the operative procedure with the patient and family including alternatives, benefits and risks; including but not limited to bleeding, blood transfusion, infection, stroke, myocardial infarction, heart block requiring a permanent pacemaker, organ dysfunction, and death. Lindie Spruce understands and agrees to proceed. We will schedule surgery for Tuesday 11/15/2013. I discussed the pros and cons of mechanical and tissue valves with the patient and his family. I would recommend using a tissue valve at his age and the current ACC guidelines recommend a tissue valve for patients 80 and older. He is in agreement with that.   Gaye Pollack, MD

## 2013-11-15 NOTE — Anesthesia Procedure Notes (Signed)
Procedure Name: Intubation Date/Time: 11/15/2013 7:50 AM Performed by: Trixie Deis A Pre-anesthesia Checklist: Patient identified, Emergency Drugs available, Suction available, Patient being monitored and Timeout performed Patient Re-evaluated:Patient Re-evaluated prior to inductionOxygen Delivery Method: Circle system utilized Preoxygenation: Pre-oxygenation with 100% oxygen Intubation Type: IV induction Ventilation: Mask ventilation without difficulty Laryngoscope Size: Mac and 3 Grade View: Grade I Tube type: Oral Tube size: 8.0 mm Number of attempts: 1 Airway Equipment and Method: Stylet and LTA kit utilized Placement Confirmation: ETT inserted through vocal cords under direct vision,  breath sounds checked- equal and bilateral and positive ETCO2 Secured at: 23 cm Tube secured with: Tape Dental Injury: Teeth and Oropharynx as per pre-operative assessment

## 2013-11-15 NOTE — Procedures (Signed)
Extubation Procedure Note  Patient Details:   Name: Esley Brooking DOB: 27-Aug-1945 MRN: [id number]   Airway Documentation:     Evaluation  O2 sats: stable throughout Complications: No apparent complications Patient did tolerate procedure well. Bilateral Breath Sounds: Clear;Diminished   Yes NIF-20, FVC-1.4L Placed on 4l/min [employer]  Revonda Standard 11/15/2013, 5:57 PM

## 2013-11-16 ENCOUNTER — Inpatient Hospital Stay (HOSPITAL_COMMUNITY): Payer: Medicare Other

## 2013-11-16 DIAGNOSIS — Q231 Congenital insufficiency of aortic valve: Secondary | ICD-10-CM | POA: Diagnosis not present

## 2013-11-16 DIAGNOSIS — J9819 Other pulmonary collapse: Secondary | ICD-10-CM | POA: Diagnosis not present

## 2013-11-16 DIAGNOSIS — D696 Thrombocytopenia, unspecified: Secondary | ICD-10-CM | POA: Diagnosis not present

## 2013-11-16 DIAGNOSIS — D62 Acute posthemorrhagic anemia: Secondary | ICD-10-CM | POA: Diagnosis not present

## 2013-11-16 LAB — BASIC METABOLIC PANEL
BUN: 14 mg/dL (ref 6–23)
CO2: 24 mEq/L (ref 19–32)
CREATININE: 0.76 mg/dL (ref 0.50–1.35)
Calcium: 8.1 mg/dL — ABNORMAL LOW (ref 8.4–10.5)
Chloride: 105 mEq/L (ref 96–112)
Glucose, Bld: 106 mg/dL — ABNORMAL HIGH (ref 70–99)
Potassium: 4.1 mEq/L (ref 3.7–5.3)
Sodium: 139 mEq/L (ref 137–147)

## 2013-11-16 LAB — CBC
HEMATOCRIT: 28.4 % — AB (ref 39.0–52.0)
Hemoglobin: 9.9 g/dL — ABNORMAL LOW (ref 13.0–17.0)
MCH: 30.4 pg (ref 26.0–34.0)
MCHC: 34.9 g/dL (ref 30.0–36.0)
MCV: 87.1 fL (ref 78.0–100.0)
PLATELETS: 88 10*3/uL — AB (ref 150–400)
RBC: 3.26 MIL/uL — ABNORMAL LOW (ref 4.22–5.81)
RDW: 14.2 % (ref 11.5–15.5)
WBC: 11.5 10*3/uL — ABNORMAL HIGH (ref 4.0–10.5)

## 2013-11-16 LAB — GLUCOSE, CAPILLARY
GLUCOSE-CAPILLARY: 100 mg/dL — AB (ref 70–99)
GLUCOSE-CAPILLARY: 101 mg/dL — AB (ref 70–99)
GLUCOSE-CAPILLARY: 105 mg/dL — AB (ref 70–99)
GLUCOSE-CAPILLARY: 108 mg/dL — AB (ref 70–99)
GLUCOSE-CAPILLARY: 109 mg/dL — AB (ref 70–99)
GLUCOSE-CAPILLARY: 110 mg/dL — AB (ref 70–99)
GLUCOSE-CAPILLARY: 114 mg/dL — AB (ref 70–99)
GLUCOSE-CAPILLARY: 120 mg/dL — AB (ref 70–99)
GLUCOSE-CAPILLARY: 133 mg/dL — AB (ref 70–99)
Glucose-Capillary: 164 mg/dL — ABNORMAL HIGH (ref 70–99)
Glucose-Capillary: 104 mg/dL — ABNORMAL HIGH (ref 70–99)
Glucose-Capillary: 102 mg/dL — ABNORMAL HIGH (ref 70–99)
Glucose-Capillary: 112 mg/dL — ABNORMAL HIGH (ref 70–99)
Glucose-Capillary: 113 mg/dL — ABNORMAL HIGH (ref 70–99)
Glucose-Capillary: 106 mg/dL — ABNORMAL HIGH (ref 70–99)
Glucose-Capillary: 115 mg/dL — ABNORMAL HIGH (ref 70–99)
Glucose-Capillary: 107 mg/dL — ABNORMAL HIGH (ref 70–99)
Glucose-Capillary: 106 mg/dL — ABNORMAL HIGH (ref 70–99)
Glucose-Capillary: 121 mg/dL — ABNORMAL HIGH (ref 70–99)
Glucose-Capillary: 117 mg/dL — ABNORMAL HIGH (ref 70–99)
Glucose-Capillary: 104 mg/dL — ABNORMAL HIGH (ref 70–99)
Glucose-Capillary: 85 mg/dL (ref 70–99)
Glucose-Capillary: 81 mg/dL (ref 70–99)

## 2013-11-16 LAB — MAGNESIUM: MAGNESIUM: 2.3 mg/dL (ref 1.5–2.5)

## 2013-11-16 MED ORDER — SODIUM CHLORIDE 0.9 % IV SOLN: 250.0000 mL | INTRAVENOUS | Status: DC | PRN

## 2013-11-16 MED ORDER — DOCUSATE SODIUM 100 MG PO CAPS
200.0000 mg | ORAL_CAPSULE | Freq: Every day | ORAL | Status: DC
  Administered 2013-11-17 – 2013-11-18 (×2): 200 mg via ORAL
  Filled 2013-11-16 (×3): qty 2

## 2013-11-16 MED ORDER — ONDANSETRON HCL 4 MG PO TABS: 4.0000 mg | ORAL_TABLET | Freq: Four times a day (QID) | ORAL | Status: DC | PRN

## 2013-11-16 MED ORDER — BISACODYL 10 MG RE SUPP: 10.0000 mg | Freq: Every day | RECTAL | Status: DC | PRN

## 2013-11-16 MED ORDER — INSULIN ASPART 100 UNIT/ML ~~LOC~~ SOLN: 0.0000 [IU] | SUBCUTANEOUS | Status: DC

## 2013-11-16 MED ORDER — MOVING RIGHT ALONG BOOK
Freq: Once | Status: AC
  Administered 2013-11-16: 12:00:00
  Filled 2013-11-16: qty 1

## 2013-11-16 MED ORDER — POTASSIUM CHLORIDE CRYS ER 20 MEQ PO TBCR
40.0000 meq | EXTENDED_RELEASE_TABLET | Freq: Once | ORAL | Status: AC
  Administered 2013-11-16: 40 meq via ORAL
  Filled 2013-11-16: qty 2

## 2013-11-16 MED ORDER — INSULIN DETEMIR 100 UNIT/ML ~~LOC~~ SOLN
30.0000 [IU] | Freq: Once | SUBCUTANEOUS | Status: DC
  Filled 2013-11-16: qty 0.3

## 2013-11-16 MED ORDER — METOPROLOL TARTRATE 25 MG PO TABS
25.0000 mg | ORAL_TABLET | Freq: Two times a day (BID) | ORAL | Status: DC
  Administered 2013-11-16 – 2013-11-18 (×5): 25 mg via ORAL
  Filled 2013-11-16 (×7): qty 1

## 2013-11-16 MED ORDER — OXYCODONE HCL 5 MG PO TABS
5.0000 mg | ORAL_TABLET | ORAL | Status: DC | PRN
  Administered 2013-11-16 – 2013-11-17 (×2): 10 mg via ORAL
  Filled 2013-11-16 (×2): qty 2

## 2013-11-16 MED ORDER — INSULIN DETEMIR 100 UNIT/ML ~~LOC~~ SOLN
30.0000 [IU] | Freq: Every day | SUBCUTANEOUS | Status: DC
Start: 2013-11-17 — End: 2013-11-16

## 2013-11-16 MED ORDER — METOPROLOL TARTRATE 25 MG/10 ML ORAL SUSPENSION
25.0000 mg | Freq: Two times a day (BID) | ORAL | Status: DC
  Filled 2013-11-16 (×2): qty 10

## 2013-11-16 MED ORDER — TRAMADOL HCL 50 MG PO TABS: 50.0000 mg | ORAL_TABLET | ORAL | Status: DC | PRN

## 2013-11-16 MED ORDER — SODIUM CHLORIDE 0.9 % IJ SOLN
3.0000 mL | Freq: Two times a day (BID) | INTRAMUSCULAR | Status: DC
  Administered 2013-11-16 – 2013-11-18 (×6): 3 mL via INTRAVENOUS

## 2013-11-16 MED ORDER — INSULIN ASPART 100 UNIT/ML ~~LOC~~ SOLN: 0.0000 [IU] | Freq: Three times a day (TID) | SUBCUTANEOUS | Status: DC

## 2013-11-16 MED ORDER — ASPIRIN EC 325 MG PO TBEC
325.0000 mg | DELAYED_RELEASE_TABLET | Freq: Every day | ORAL | Status: DC
  Administered 2013-11-17 – 2013-11-18 (×2): 325 mg via ORAL
  Filled 2013-11-16 (×3): qty 1

## 2013-11-16 MED ORDER — INSULIN DETEMIR 100 UNIT/ML ~~LOC~~ SOLN
30.0000 [IU] | Freq: Once | SUBCUTANEOUS | Status: AC
  Administered 2013-11-16: 30 [IU] via SUBCUTANEOUS
  Filled 2013-11-16: qty 0.3

## 2013-11-16 MED ORDER — ONDANSETRON HCL 4 MG/2ML IJ SOLN: 4.0000 mg | Freq: Four times a day (QID) | INTRAMUSCULAR | Status: DC | PRN

## 2013-11-16 MED ORDER — ACETAMINOPHEN 325 MG PO TABS: 650.0000 mg | ORAL_TABLET | Freq: Four times a day (QID) | ORAL | Status: DC | PRN

## 2013-11-16 MED ORDER — FUROSEMIDE 10 MG/ML IJ SOLN
40.0000 mg | Freq: Once | INTRAMUSCULAR | Status: AC
  Administered 2013-11-16: 40 mg via INTRAVENOUS

## 2013-11-16 MED ORDER — PANTOPRAZOLE SODIUM 40 MG PO TBEC
40.0000 mg | DELAYED_RELEASE_TABLET | Freq: Every day | ORAL | Status: DC
  Administered 2013-11-17 – 2013-11-19 (×2): 40 mg via ORAL
  Filled 2013-11-16 (×2): qty 1

## 2013-11-16 MED ORDER — SODIUM CHLORIDE 0.9 % IJ SOLN: 3.0000 mL | INTRAMUSCULAR | Status: DC | PRN

## 2013-11-16 MED ORDER — FUROSEMIDE 10 MG/ML IJ SOLN
INTRAMUSCULAR | Status: AC
  Administered 2013-11-16: 40 mg via INTRAVENOUS
  Filled 2013-11-16: qty 4

## 2013-11-16 MED ORDER — BISACODYL 5 MG PO TBEC: 10.0000 mg | DELAYED_RELEASE_TABLET | Freq: Every day | ORAL | Status: DC | PRN

## 2013-11-16 MED ORDER — METOPROLOL TARTRATE 25 MG PO TABS
25.0000 mg | ORAL_TABLET | Freq: Two times a day (BID) | ORAL | Status: DC
Start: 2013-11-16 — End: 2013-11-16
  Administered 2013-11-16: 25 mg via ORAL
  Filled 2013-11-16 (×2): qty 1

## 2013-11-16 MED FILL — Dexmedetomidine HCl IV Soln 200 MCG/2ML: INTRAVENOUS | Qty: 2 | Status: AC

## 2013-11-16 MED FILL — Heparin Sodium (Porcine) Inj 1000 Unit/ML: INTRAMUSCULAR | Qty: 30 | Status: AC

## 2013-11-16 MED FILL — Magnesium Sulfate Inj 50%: INTRAMUSCULAR | Qty: 10 | Status: AC

## 2013-11-16 MED FILL — Potassium Chloride Inj 2 mEq/ML: INTRAVENOUS | Qty: 40 | Status: AC

## 2013-11-16 MED FILL — Mannitol IV Soln 20%: INTRAVENOUS | Qty: 500 | Status: AC

## 2013-11-16 MED FILL — Lidocaine HCl IV Inj 20 MG/ML: INTRAVENOUS | Qty: 10 | Status: AC

## 2013-11-16 MED FILL — Electrolyte-R (PH 7.4) Solution: INTRAVENOUS | Qty: 4000 | Status: AC

## 2013-11-16 MED FILL — Sodium Bicarbonate IV Soln 8.4%: INTRAVENOUS | Qty: 50 | Status: AC

## 2013-11-16 MED FILL — Heparin Sodium (Porcine) Inj 1000 Unit/ML: INTRAMUSCULAR | Qty: 10 | Status: AC

## 2013-11-16 MED FILL — Heparin Sodium (Porcine) Inj 1000 Unit/ML: INTRAMUSCULAR | Qty: 2500 | Status: AC

## 2013-11-16 MED FILL — Sodium Chloride IV Soln 0.9%: INTRAVENOUS | Qty: 2000 | Status: AC

## 2013-11-16 NOTE — Progress Notes (Signed)
CARDIAC REHAB PHASE I   PRE:  Rate/Rhythm: 7 SR  BP:  Supine: 146/77  Sitting:   Standing:    SaO2: 96%RA  MODE:  Ambulation: 150 ft   POST:  Rate/Rhythm: 93SR  BP:  Supine: 158/83  Sitting:   Standing:    SaO2: 93%RA 1445-1520 Pt walked 150 ft on RA with gait belt use, rolling walker and asst x1 with fairly steady gait. Stopped twice to rest. Stated this was his first walk in hall. Back to bed at his request. Bed alarm on. Tolerated well for first walk.   Graylon Good, RN BSN  11/16/2013 3:17 PM

## 2013-11-16 NOTE — Progress Notes (Addendum)
[address]       [place] [address]             [phone]      1 Day Post-Op Procedure(s) (LRB): AORTIC VALVE REPLACEMENT (AVR) (N/A) INTRAOPERATIVE TRANSESOPHAGEAL ECHOCARDIOGRAM (N/A)  Subjective:  [name] states he is doing okay this morning.  He does have some incisional discomfort which is relieved for the most part with pain medication.  Objective: Vital signs in last 24 hours: Temp:  [95.9 F (35.5 C)-98.4 F (36.9 C)] 98.4 F (36.9 C) (05/27 0700) Pulse Rate:  [56-80] 72 (05/27 0700) Cardiac Rhythm:  [-] Normal sinus rhythm (05/27 0730) Resp:  [9-28] 20 (05/27 0700) BP: (89-138)/(54-94) 119/61 mmHg (05/27 0700) SpO2:  [96 %-100 %] 100 % (05/27 0700) Arterial Line BP: (94-193)/(54-188) 144/77 mmHg (05/27 0700) FiO2 (%):  [40 %-50 %] 40 % (05/26 1715) Weight:  [170 lb 10.2 oz (77.4 kg)-176 lb 9.4 oz (80.1 kg)] 176 lb 9.4 oz (80.1 kg) (05/27 0439)  Hemodynamic parameters for last 24 hours: PAP: (15-26)/(5-17) 26/13 mmHg CO:  [4.2 L/min-5.7 L/min] 5.2 L/min CI:  [2.1 L/min/m2-2.9 L/min/m2] 2.7 L/min/m2  Intake/Output from previous day: 05/26 0701 - 05/27 0700 In: 5493.7 [I.V.:3288.7; Blood:975; NG/GT:30; IV Piggyback:1200] Out: 3976 [Urine:4120; Emesis/NG output:100; Blood:900; Chest Tube:340] Intake/Output this shift: Total I/O In: -  Out: 35 [Urine:35]  General appearance: alert, cooperative and no distress Heart: regular rate and rhythm Lungs: clear to auscultation bilaterally Abdomen: soft, non-tender; bowel sounds normal; no masses,  no organomegaly Extremities: extremities normal, atraumatic, no cyanosis or edema Wound: clean and dry  Lab Results:  Recent Labs  11/15/13 1800 11/15/13 1921 11/16/13 0350  WBC 10.6*  --  11.5*  HGB 10.1* 10.2* 9.9*  HCT 29.7* 30.0* 28.4*  PLT 91*  --  88*   BMET:  Recent Labs  11/15/13 1921 11/16/13 0350  NA 140 139  K 4.1 4.1  CL 104 105  CO2  --  24  GLUCOSE 175* 106*  BUN  10 14  CREATININE 0.80 0.76  CALCIUM  --  8.1*    PT/INR:  Recent Labs  11/15/13 1200  LABPROT 17.3*  INR 1.45   ABG    Component Value Date/Time   PHART 7.382 11/15/2013 1917   HCO3 23.8 11/15/2013 1917   TCO2 23 11/15/2013 1921   ACIDBASEDEF 1.0 11/15/2013 1917   O2SAT 99.0 11/15/2013 1917   CBG (last 3)   Recent Labs  11/15/13 2220 11/15/13 2307 11/16/13 0012  GLUCAP 110* 109* 101*    Assessment/Plan: S/P Procedure(s) (LRB): AORTIC VALVE REPLACEMENT (AVR) (N/A) INTRAOPERATIVE TRANSESOPHAGEAL ECHOCARDIOGRAM (N/A)  1. CV- NSR, off all drips, will start Beta Blocker as tolerated 2. Pulm- wean oxygen as tolerated, good use of IS, + atelectasis on CXR 3. Renal- creatinine WNL, minimal volume overloaded, will hold diuretic for now 4. Expected Acute Blood Loss anemia- Hgb stable at 9.9 5. CBGs controlled, will start Levemir, wean insulin drip as tolerated 6. Dispo- patient doing well, POD #1 progression orders, possibly transfer to step down later this afternoon vs tomorrow   LOS: 1 day    Ellwood Handler 11/16/2013   Chart reviewed, patient examined, agree with above. He looks good overall. ECG shows new LBBB postop which is not surprising considering the amount of annular calcification that had to be debrided. He has been very hypertensive postop but nipride weaned off overnight. Will increase Lopressor to 25 bid. Will give him a  dose of lasix this am. He can transfer to the circle on 2W.

## 2013-11-16 NOTE — Progress Notes (Signed)
Attempted to call report to [facility] 

## 2013-11-16 NOTE — Progress Notes (Signed)
Pt received into room 2w24, pt resting in bed with family at bedside, pt oriented to room and call bell, will continue to monitor pt, tele placed on pt Rickard Rhymes, RN

## 2013-11-17 ENCOUNTER — Inpatient Hospital Stay (HOSPITAL_COMMUNITY): Payer: Medicare Other

## 2013-11-17 ENCOUNTER — Encounter (HOSPITAL_COMMUNITY): Payer: Self-pay | Admitting: Surgery

## 2013-11-17 LAB — GLUCOSE, CAPILLARY
GLUCOSE-CAPILLARY: 123 mg/dL — AB (ref 70–99)
GLUCOSE-CAPILLARY: 99 mg/dL (ref 70–99)
Glucose-Capillary: 81 mg/dL (ref 70–99)
Glucose-Capillary: 95 mg/dL (ref 70–99)

## 2013-11-17 LAB — CBC
HEMATOCRIT: 28.4 % — AB (ref 39.0–52.0)
Hemoglobin: 9.7 g/dL — ABNORMAL LOW (ref 13.0–17.0)
MCH: 30.2 pg (ref 26.0–34.0)
MCHC: 34.2 g/dL (ref 30.0–36.0)
MCV: 88.5 fL (ref 78.0–100.0)
PLATELETS: 108 10*3/uL — AB (ref 150–400)
RBC: 3.21 MIL/uL — ABNORMAL LOW (ref 4.22–5.81)
RDW: 14.5 % (ref 11.5–15.5)
WBC: 15.1 10*3/uL — ABNORMAL HIGH (ref 4.0–10.5)

## 2013-11-17 LAB — BASIC METABOLIC PANEL
BUN: 20 mg/dL (ref 6–23)
CALCIUM: 8.6 mg/dL (ref 8.4–10.5)
CO2: 25 meq/L (ref 19–32)
CREATININE: 0.84 mg/dL (ref 0.50–1.35)
Chloride: 98 mEq/L (ref 96–112)
GFR calc Af Amer: >90 mL/min (ref >90)
GFR calc non Af Amer: 87 mL/min — ABNORMAL LOW (ref >90)
Glucose, Bld: 102 mg/dL — ABNORMAL HIGH (ref 70–99)
Potassium: 4.1 mEq/L (ref 3.7–5.3)
Sodium: 134 mEq/L — ABNORMAL LOW (ref 137–147)

## 2013-11-17 MED ORDER — LISINOPRIL 5 MG PO TABS
5.0000 mg | ORAL_TABLET | Freq: Every day | ORAL | Status: DC
  Administered 2013-11-17: 5 mg via ORAL
  Filled 2013-11-17 (×2): qty 1

## 2013-11-17 MED ORDER — DIPHENHYDRAMINE HCL 12.5 MG/5ML PO ELIX
12.5000 mg | ORAL_SOLUTION | Freq: Once | ORAL | Status: AC
  Administered 2013-11-17: 12.5 mg via ORAL
  Filled 2013-11-17: qty 5

## 2013-11-17 NOTE — Progress Notes (Signed)
CARDIAC REHAB PHASE I   PRE:  Rate/Rhythm: 88 SR  BP:  Supine: 148/80  Sitting:   Standing:    SaO2: 96%RA  MODE:  Ambulation: 350 ft   POST:  Rate/Rhythm: 106 ST  BP:  Supine:   Sitting: 161/84  Standing:    SaO2: 98%RA 1050-1115 Pt not happy about neck dressing. Explained purpose and support given. Encouraged OOB. Walked 350 ft on RA with rolling walker and asst x1. Tolerated increase in distance well. To recliner with call bell. Knows he needs two more walks today.   Graylon Good, RN BSN  11/17/2013 11:12 AM

## 2013-11-17 NOTE — Progress Notes (Addendum)
       [address]       [place] [address]             [phone]          2 Days Post-Op Procedure(s) (LRB): AORTIC VALVE REPLACEMENT (AVR) (N/A) INTRAOPERATIVE TRANSESOPHAGEAL ECHOCARDIOGRAM (N/A)  Subjective: Feels well, wants central line out.     Objective: Vital signs in last 24 hours: Patient Vitals for the past 24 hrs:  BP Temp Temp src Pulse Resp SpO2 Weight  11/17/13 0358 156/89 mmHg 97.5 F (36.4 C) Oral 87 20 100 % -  11/17/13 0352 - - - - - - 176 lb 9.4 oz (80.1 kg)  11/16/13 1940 164/80 mmHg 98.1 F (36.7 C) Oral 80 20 99 % -  11/16/13 1231 147/84 mmHg 97.9 F (36.6 C) Oral 75 20 97 % -  11/16/13 1100 154/73 mmHg - - 72 20 99 % -  11/16/13 1000 145/78 mmHg - - - 22 - -  11/16/13 0900 132/74 mmHg 98.2 F (36.8 C) - 77 22 97 % -  11/16/13 0800 134/76 mmHg 98.2 F (36.8 C) - 79 32 100 % -   Current Weight  11/17/13 176 lb 9.4 oz (80.1 kg)  PRE-OPERATIVE WEIGHT: 77 kg    Intake/Output from previous day: 05/27 0701 - 05/28 0700 In: 246.2 [P.O.:120; I.V.:126.2] Out: 1235 [Urine:1195; Chest Tube:40]  CBGs [id number]   PHYSICAL EXAM:  Heart: RRR Lungs: Clear Wound: Clean and dry Extremities: No significant LE edema    Lab Results: CBC: Recent Labs  11/16/13 0350 11/17/13 0332  WBC 11.5* 15.1*  HGB 9.9* 9.7*  HCT 28.4* 28.4*  PLT 88* 108*   BMET:  Recent Labs  11/16/13 0350 11/17/13 0332  NA 139 134*  K 4.1 4.1  CL 105 98  CO2 24 25  GLUCOSE 106* 102*  BUN 14 20  CREATININE 0.76 0.84  CALCIUM 8.1* 8.6    PT/INR:  Recent Labs  11/15/13 1200  LABPROT 17.3*  INR 1.45   CXR: stable, improving atelectasis   Assessment/Plan: S/P Procedure(s) (LRB): AORTIC VALVE REPLACEMENT (AVR) (N/A) INTRAOPERATIVE TRANSESOPHAGEAL ECHOCARDIOGRAM (N/A)  CV- BPs remain elevated, HR stable with freq PVCs. Lopressor increased to 25 mg bid yesterday.  Discussed with MD- will start low dose ACE-I and  watch.  Leukocytosis, no fever.  WBC up 11K->15K today.  Will watch. Pre-op U/a negative. Likely atelectasis.  Encouraged increased pulm toilet today.  CRPI/ambulation. D/c central line.  CBGs stable, no h/o DM (A1C=5.5).  Will d/c Levemir.   LOS: 2 days    Coolidge Breeze 11/17/2013

## 2013-11-17 NOTE — Progress Notes (Signed)
Pt is restless and anxious cannot rest and wants something to help him sleep.  MD on call paged to request something to help pt rest.  MD on call ordered Benadryl 12.5mg  PRN once to help pt rest tonight and for pt's MD to review with him tomorrow if needed for continued use.  Pt resting in bed, call light in reach, bed alarm on, RN will continue to monitor.   Nolon Nations, RN

## 2013-11-17 NOTE — Progress Notes (Signed)
Pt ambulating hall with family using RW.

## 2013-11-17 NOTE — Progress Notes (Signed)
Pt right IJ room today by day shift RN.  Pt stated during shift change rounds that MD removed dressing during his rounds and told him it was okay not to have it.  Pt educated to contact RN if feels like site is wet, he is short of breath, or light headed.  Call light in reach RN will continue to monitor.   Nolon Nations, RN

## 2013-11-18 LAB — GLUCOSE, CAPILLARY
GLUCOSE-CAPILLARY: 116 mg/dL — AB (ref 70–99)
Glucose-Capillary: 121 mg/dL — ABNORMAL HIGH (ref 70–99)
Glucose-Capillary: 96 mg/dL (ref 70–99)
Glucose-Capillary: 95 mg/dL (ref 70–99)
Glucose-Capillary: 111 mg/dL — ABNORMAL HIGH (ref 70–99)

## 2013-11-18 LAB — CBC
HEMATOCRIT: 24.4 % — AB (ref 39.0–52.0)
Hemoglobin: 8.5 g/dL — ABNORMAL LOW (ref 13.0–17.0)
MCH: 30.5 pg (ref 26.0–34.0)
MCHC: 34.8 g/dL (ref 30.0–36.0)
MCV: 87.5 fL (ref 78.0–100.0)
Platelets: 89 10*3/uL — ABNORMAL LOW (ref 150–400)
RBC: 2.79 MIL/uL — ABNORMAL LOW (ref 4.22–5.81)
RDW: 14.4 % (ref 11.5–15.5)
WBC: 7.8 10*3/uL (ref 4.0–10.5)

## 2013-11-18 LAB — BASIC METABOLIC PANEL
BUN: 19 mg/dL (ref 6–23)
CO2: 26 mEq/L (ref 19–32)
CREATININE: 0.76 mg/dL (ref 0.50–1.35)
Calcium: 8.4 mg/dL (ref 8.4–10.5)
Chloride: 101 mEq/L (ref 96–112)
GFR calc Af Amer: >90 mL/min (ref >90)
Glucose, Bld: 117 mg/dL — ABNORMAL HIGH (ref 70–99)
Potassium: 4.1 mEq/L (ref 3.7–5.3)
Sodium: 137 mEq/L (ref 137–147)

## 2013-11-18 MED ORDER — LISINOPRIL 10 MG PO TABS
10.0000 mg | ORAL_TABLET | Freq: Every day | ORAL | Status: DC
  Administered 2013-11-18: 10 mg via ORAL
  Filled 2013-11-18 (×2): qty 1

## 2013-11-18 NOTE — Progress Notes (Signed)
Pt ambulated 550 feet on RA with front wheel walker, pt tolerated well Rickard Rhymes, RN

## 2013-11-18 NOTE — Progress Notes (Addendum)
       [address]       [place] [address]             [phone]          3 Days Post-Op Procedure(s) (LRB): AORTIC VALVE REPLACEMENT (AVR) (N/A) INTRAOPERATIVE TRANSESOPHAGEAL ECHOCARDIOGRAM (N/A)  Subjective: Comfortable, no complaints except resting poorly.  Eating ok, had a BM.   Objective: Vital signs in last 24 hours: Patient Vitals for the past 24 hrs:  BP Temp Temp src Pulse Resp SpO2 Weight  11/18/13 0523 137/75 mmHg 98.6 F (37 C) Oral 85 18 98 % 174 lb 9.7 oz (79.2 kg)  11/17/13 1958 170/90 mmHg 99 F (37.2 C) Oral 101 18 95 % -  11/17/13 1324 157/57 mmHg 98 F (36.7 C) Oral 87 18 100 % -   Current Weight  11/18/13 174 lb 9.7 oz (79.2 kg)  PRE-OPERATIVE WEIGHT: 77 kg    Intake/Output from previous day: 05/28 0701 - 05/29 0700 In: 630 [P.O.:630] Out: 1400 [Urine:1400]    PHYSICAL EXAM:  Heart: RRR Lungs: Clear Wound: Clean and dry Extremities: No significant LE edema    Lab Results: CBC: Recent Labs  11/17/13 0332 11/18/13 0550  WBC 15.1* 7.8  HGB 9.7* 8.5*  HCT 28.4* 24.4*  PLT 108* 89*   BMET:  Recent Labs  11/17/13 0332 11/18/13 0550  NA 134* 137  K 4.1 4.1  CL 98 101  CO2 25 26  GLUCOSE 102* 117*  BUN 20 19  CREATININE 0.84 0.76  CALCIUM 8.6 8.4    PT/INR:  Recent Labs  11/15/13 1200  LABPROT 17.3*  INR 1.45      Assessment/Plan: S/P Procedure(s) (LRB): AORTIC VALVE REPLACEMENT (AVR) (N/A) INTRAOPERATIVE TRANSESOPHAGEAL ECHOCARDIOGRAM (N/A) CV- BPs remain elevated.  Will continue Lopressor, increase ACE-I. Leukocytosis, resolved. Expected postop blood loss anemia- watch H/H. CRPI/ambulation. D/c EPWs. Possibly home later today vs in am if remains stable.    LOS: 3 days    Coolidge Breeze 11/18/2013   Chart reviewed, patient examined, agree with above. He looks great and can go home in the morning if no changes.

## 2013-11-18 NOTE — Progress Notes (Signed)
CARDIAC REHAB PHASE I   PRE:  Rate/Rhythm: 78 SR  BP:  Supine:   Sitting: 131/74  Standing:    SaO2: 91%RA  MODE:  Ambulation: 550 ft   POST:  Rate/Rhythm: 102 ST  BP:  Supine:   Sitting: 147/76  Standing:    SaO2: 94%RA 0950-1036 Pt did not want rolling walker for home use so did not use to walk. Pt walked 550 ft on RA with hand held asst with steady gait. Tolerated well. To recliner after walk. Education completed with daughter who said she would pass on to her mother. Exercise written out for review. Pt and family voiced understanding. Discussed CRP2 and permission given to refer to [place] program. Encouraged family to walk with pt until he feels steady to walk alone. Encouraged IS. Encouraged low sodium.   Graylon Good, RN BSN  11/18/2013 10:31 AM

## 2013-11-18 NOTE — Progress Notes (Signed)
Pacing wires removed per protocol and order, pacing wires intact upon removal, pt made aware of bedrest for 1 hour and stated understanding, pt tolerated well, vitals taken, chest tube sutures removed per order and protocol, steri-strips applied, will continue to monitor pt Rickard Rhymes, RN

## 2013-11-18 NOTE — Discharge Summary (Signed)
[address]       [place] [address]             [phone]              Discharge Summary  Name: Willie Freeman DOB: [birth year] 68 y.o. MRN: [id number]   Admission Date: 11/15/2013 Discharge Date: 11/19/2013    Admitting Diagnosis: Severe aortic stenosis   Discharge Diagnosis:  Severe aortic stenosis Bicuspid aortic valve Postoperative hypertension Expected postoperative blood loss anemia  Past Medical History  Diagnosis Date  . Back pain   . Myocardial infarction   . Hypertension   . Dysrhythmia   . Heart murmur   . Stroke     ministroke     Procedures: AORTIC VALVE REPLACEMENT (25 mm [facility] Ease pericardial tissue valve) - 11/15/2013   HPI:  The patient is a 68 y.o. male in previously good health who does not go to a physician routinely and has not been in many years. He awoke on the evening of 11/01/2013 with chest pressure, feeling warm, and having a headache and was taken to [facility] where he was evaluated for an acute coronary syndrome with nonspecific anterior ST changes. He was noted to have confusion and right sided weakness. He was transferred to [facility] as a code stroke and was evaluated by neurology. He was felt to have a left brain TIA. He continued to have chest discomfort off and on with subtle dynamic ECG changes. He had a positive troponin of 0.57 that peaked at 6.96. He had a brain MRI that showed an acute punctate nonhemorrhagic infarct in the high right frontal cortex as well as remote lacunar infarcts within the caudate head and cerebellum bilaterally. A TEE showed normal LVEF of 55-60% with no wall motion abnormalities. The aortic valve is bicuspid and severely calcified with moderate to severe AI. The mean gradient is 44 and the peak is 66 with an AVA of 0.86. Cardiac catheterization revealed no significant coronary artery disease.  He had resolution of his neurologic and chest pain symptoms and was  discharged. The patient was referred to Dr. Cyndia Bent for cardiac surgery evaluation as an outpatient.  It was felt that he would benefit from aortic valve replacement at this time. All risks, benefits and alternatives of surgery were explained in detail, and the patient agreed to proceed.    Hospital Course:  The patient was admitted to [facility] on 11/15/2013. The patient was taken to the operating room and underwent the above procedure.    The postoperative course has been notable for hypertension which initially required IV nipride. This was ultimately discontinued and the patient was started on a beta blocker.  He has continued to have elevated blood pressures and an ACE-I was also started with improved control.  He has otherwise done well postoperatively.  Incisions are healing well.  He is ambulating in the halls without difficulty and tolerating a regular diet. His cardiac status has remained stable and he is maintaining sinus rhythm.  We anticipate discharge home in 24-48 hours provided he remains stable.     Recent vital signs:  Filed Vitals:   11/19/13 0633  BP: 135/88  Pulse: 83  Temp: 98.9 F (37.2 C)  Resp: 18    Recent laboratory studies:  CBC:  Recent Labs  11/17/13 0332 11/18/13 0550  WBC 15.1* 7.8  HGB 9.7* 8.5*  HCT 28.4* 24.4*  PLT 108* 89*   BMET:  Recent Labs  11/17/13 0332 11/18/13 0550  NA 134* 137  K 4.1 4.1  CL 98 101  CO2 25 26  GLUCOSE 102* 117*  BUN 20 19  CREATININE 0.84 0.76  CALCIUM 8.6 8.4    PT/INR: No results found for this basename: LABPROT, INR,  in the last 72 hours   Discharge Medications:     Medication List         aspirin 325 MG EC tablet  Take 1 tablet (325 mg total) by mouth daily.     atorvastatin 80 MG tablet  Commonly known as:  LIPITOR  Take 1 tablet (80 mg total) by mouth daily at 6 PM.     ferrous sulfate 325 (65 FE) MG tablet  Take 1 tablet (325 mg total) by mouth daily with breakfast. For one month  then stop.     finasteride 5 MG tablet  Commonly known as:  PROSCAR  Take 5 mg by mouth daily with supper.     lisinopril 10 MG tablet  Commonly known as:  PRINIVIL,ZESTRIL  Take 1 tablet (10 mg total) by mouth daily.     metoprolol tartrate 25 MG tablet  Commonly known as:  LOPRESSOR  Take 1 tablet (25 mg total) by mouth 2 (two) times daily.     solifenacin 5 MG tablet  Commonly known as:  VESICARE  Take 5 mg by mouth at bedtime.     tamsulosin 0.4 MG Caps capsule  Commonly known as:  FLOMAX  Take 0.4 mg by mouth daily after supper.     traMADol 50 MG tablet  Commonly known as:  ULTRAM  Take 1 tablet (50 mg total) by mouth every 6 (six) hours as needed for moderate pain.       The patient has been discharged on:   1.Beta Blocker:  Yes [ x  ]                              No   [   ]                              If No, reason:  2.Ace Inhibitor/ARB: Yes [ x  ]                                     No  [    ]                                     If No, reason:  3.Statin:   Yes [ x  ]                  No  [   ]                  If No, reason:  4.Ecasa:  Yes  [ x  ]                  No   [   ]                  If No, reason:  Discharge Instructions:  The patient is to refrain from driving, heavy lifting or strenuous activity.  May shower daily and clean incisions with soap and water.  May resume regular diet.   Follow Up:  Discharge Instructions   Amb Referral to Cardiac Rehabilitation    Complete by:  As directed   Referring to [facility] Phase 2          Follow-up Information   Follow up with Laverda Page, MD. Schedule an appointment as soon as possible for a visit in 2 weeks.   Specialty:  Cardiology   Contact information:   [place] [address] [place] [address] [phone]       Follow up with Gaye Pollack, MD On 12/28/2013. (Have a chest x-ray at [facility] at 10:00, then see MD at 11:00)    Specialty:  Cardiothoracic Surgery     Contact information:   [address] [place] [address] [phone]       Follow-up Information   Follow up with Laverda Page, MD. Schedule an appointment as soon as possible for a visit in 2 weeks.   Specialty:  Cardiology   Contact information:   [place] [address] [place] [address] [phone]       Follow up with Gaye Pollack, MD On 12/28/2013. (Have a chest x-ray at [facility] at 10:00, then see MD at 11:00)    Specialty:  Cardiothoracic Surgery   Contact information:   [address] [place] [address] [place] 11/19/2013, 8:28 AM

## 2013-11-19 LAB — TYPE AND SCREEN
ABO/RH(D): O POS
Antibody Screen: NEGATIVE
UNIT DIVISION: 0
Unit division: 0

## 2013-11-19 LAB — GLUCOSE, CAPILLARY: Glucose-Capillary: 88 mg/dL (ref 70–99)

## 2013-11-19 MED ORDER — FERROUS SULFATE 325 (65 FE) MG PO TABS
325.0000 mg | ORAL_TABLET | Freq: Every day | ORAL | Status: DC
  Filled 2013-11-19 (×2): qty 1

## 2013-11-19 MED ORDER — FERROUS SULFATE 325 (65 FE) MG PO TABS: 325.0000 mg | ORAL_TABLET | Freq: Every day | ORAL | Status: DC

## 2013-11-19 MED ORDER — LISINOPRIL 10 MG PO TABS: 10.0000 mg | ORAL_TABLET | Freq: Every day | ORAL | Status: DC

## 2013-11-19 MED ORDER — METOPROLOL TARTRATE 25 MG PO TABS: 25.0000 mg | ORAL_TABLET | Freq: Two times a day (BID) | ORAL | Status: DC

## 2013-11-19 MED ORDER — TRAMADOL HCL 50 MG PO TABS: 50.0000 mg | ORAL_TABLET | Freq: Four times a day (QID) | ORAL | Status: DC | PRN

## 2013-11-19 NOTE — Progress Notes (Signed)
      [address]       [place] [address]             [phone]        4 Days Post-Op Procedure(s) (LRB): AORTIC VALVE REPLACEMENT (AVR) (N/A) INTRAOPERATIVE TRANSESOPHAGEAL ECHOCARDIOGRAM (N/A)  Subjective: Patient sitting in chair, dressed, and ready to go home.  Objective: Vital signs in last 24 hours: Temp:  [98.6 F (37 C)-99.1 F (37.3 C)] 98.9 F (37.2 C) (05/30 2952) Pulse Rate:  [80-96] 83 (05/30 8413) Cardiac Rhythm:  [-] Normal sinus rhythm (05/29 2115) Resp:  [18] 18 (05/30 0633) BP: (115-135)/(68-88) 135/88 mmHg (05/30 0633) SpO2:  [91 %-100 %] 93 % (05/30 0633) Weight:  [169 lb 15.6 oz (77.1 kg)] 169 lb 15.6 oz (77.1 kg) (05/30 2440)  Pre op weight 77 kg Current Weight  11/19/13 169 lb 15.6 oz (77.1 kg)      Intake/Output from previous day: 05/29 0701 - 05/30 0700 In: 720 [P.O.:720] Out: 100 [Urine:100]   Physical Exam:  Cardiovascular: RRR Pulmonary: Clear to auscultation bilaterally; no rales, wheezes, or rhonchi. Abdomen: Soft, non tender, bowel sounds present. Extremities: No lower extremity edema. Wounds: Clean and dry.  No erythema or signs of infection.  Lab Results: CBC: Recent Labs  11/17/13 0332 11/18/13 0550  WBC 15.1* 7.8  HGB 9.7* 8.5*  HCT 28.4* 24.4*  PLT 108* 89*   BMET:  Recent Labs  11/17/13 0332 11/18/13 0550  NA 134* 137  K 4.1 4.1  CL 98 101  CO2 25 26  GLUCOSE 102* 117*  BUN 20 19  CREATININE 0.84 0.76  CALCIUM 8.6 8.4    PT/INR:  Lab Results  Component Value Date   INR 1.45 11/15/2013   INR 1.03 11/11/2013   INR 0.96 11/02/2013   ABG:  INR: Will add last result for INR, ABG once components are confirmed Will add last 4 CBG results once components are confirmed  Assessment/Plan:  1. CV - SR in the 80's. On Lopressor 25 bid, Lisinopril 10 daily 2.  Pulmonary - Encourage incentive spirometer 3.  Acute blood loss anemia - H and H yesterday 8.5 and 24.4. Start Iron  4.  Thromboctyopenia-last platelets 89,000 5. Discharge  Naw Lasala M ZimmermanPA-C 11/19/2013,8:09 AM

## 2013-12-06 LAB — PULMONARY FUNCTION TEST
DL/VA % pred: 137 %
DL/VA: 6.43 ml/min/mmHg/L
DLCO COR % PRED: 104 %
DLCO cor: 35.13 ml/min/mmHg
DLCO unc % pred: 96 %
DLCO unc: 32.5 ml/min/mmHg
FEF 25-75 Post: 5.06 L/sec
FEF 25-75 Pre: 3.03 L/sec
FEF2575-%CHANGE-POST: 66 %
FEF2575-%PRED-PRE: 117 %
FEF2575-%Pred-Post: 196 %
FEV1-%Change-Post: 13 %
FEV1-%PRED-POST: 96 %
FEV1-%PRED-PRE: 84 %
FEV1-POST: 3.27 L
FEV1-Pre: 2.87 L
FEV1FVC-%CHANGE-POST: 3 %
FEV1FVC-%Pred-Pre: 108 %
FEV6-%CHANGE-POST: 11 %
FEV6-%Pred-Post: 91 %
FEV6-%Pred-Pre: 81 %
FEV6-PRE: 3.53 L
FEV6-Post: 3.96 L
FEV6FVC-%Change-Post: 1 %
FEV6FVC-%Pred-Post: 106 %
FEV6FVC-%Pred-Pre: 104 %
FVC-%Change-Post: 10 %
FVC-%Pred-Post: 86 %
FVC-%Pred-Pre: 78 %
FVC-Post: 3.97 L
FVC-Pre: 3.59 L
POST FEV6/FVC RATIO: 100 %
PRE FEV6/FVC RATIO: 98 %
Post FEV1/FVC ratio: 82 %
Pre FEV1/FVC ratio: 80 %
RV % PRED: 87 %
RV: 2.19 L
TLC % pred: 80 %
TLC: 5.85 L

## 2013-12-16 ENCOUNTER — Other Ambulatory Visit: Payer: Self-pay | Admitting: Physician Assistant

## 2013-12-19 ENCOUNTER — Other Ambulatory Visit: Payer: Self-pay

## 2013-12-19 DIAGNOSIS — G8918 Other acute postprocedural pain: Secondary | ICD-10-CM

## 2013-12-19 MED ORDER — TRAMADOL HCL 50 MG PO TABS: 50.0000 mg | ORAL_TABLET | Freq: Four times a day (QID) | ORAL | Status: DC | PRN

## 2013-12-19 NOTE — Telephone Encounter (Signed)
Rx refill for Tramadol was Faxed to [facility]

## 2013-12-27 ENCOUNTER — Other Ambulatory Visit: Payer: Self-pay | Admitting: Surgery

## 2013-12-27 DIAGNOSIS — G459 Transient cerebral ischemic attack, unspecified: Secondary | ICD-10-CM

## 2013-12-28 ENCOUNTER — Ambulatory Visit (INDEPENDENT_AMBULATORY_CARE_PROVIDER_SITE_OTHER): Payer: Self-pay | Admitting: Surgery

## 2013-12-28 ENCOUNTER — Encounter: Payer: Self-pay | Admitting: Surgery

## 2013-12-28 ENCOUNTER — Ambulatory Visit
Admission: RE | Admit: 2013-12-28 | Discharge: 2013-12-28 | Disposition: A | Discharge status: Home / Self Care | Payer: Medicare Other | Source: Ambulatory Visit | Attending: Surgery | Admitting: Surgery

## 2013-12-28 VITALS — BP 160/85 | HR 60 | Resp 20 | Ht 70.0 in | Wt 169.0 lb

## 2013-12-28 DIAGNOSIS — I350 Nonrheumatic aortic (valve) stenosis: Secondary | ICD-10-CM

## 2013-12-28 DIAGNOSIS — G459 Transient cerebral ischemic attack, unspecified: Secondary | ICD-10-CM

## 2013-12-28 DIAGNOSIS — I359 Nonrheumatic aortic valve disorder, unspecified: Secondary | ICD-10-CM

## 2013-12-28 DIAGNOSIS — Z954 Presence of other heart-valve replacement: Secondary | ICD-10-CM

## 2013-12-28 DIAGNOSIS — Z952 Presence of prosthetic heart valve: Secondary | ICD-10-CM

## 2013-12-28 NOTE — Progress Notes (Signed)
      HPI: Patient returns for routine postoperative follow-up having undergone AVR with a 25 mm pericardial valve on 11/15/2013. The patient's early postoperative recovery while in the hospital was notable for an uncomplicated postop course. Since hospital discharge the patient reports that he has been feeling well. He is walking at least one mile per day with no chest pain or shortness of breath. He has had no further neurologic symptoms since surgery. He is working part-time.   Current Outpatient Prescriptions  Medication Sig Dispense Refill  . aspirin EC 325 MG EC tablet Take 1 tablet (325 mg total) by mouth daily.      Marland Kitchen atorvastatin (LIPITOR) 10 MG tablet Take 10 mg by mouth daily.      . clobetasol cream (TEMOVATE) 0.37 % Apply 1 application topically 2 (two) times daily. prn      . finasteride (PROSCAR) 5 MG tablet Take 5 mg by mouth daily with supper.       Marland Kitchen lisinopril (PRINIVIL,ZESTRIL) 10 MG tablet Take 10 mg by mouth daily.      . metoprolol tartrate (LOPRESSOR) 25 MG tablet Take 12.5 mg by mouth 2 (two) times daily.      . solifenacin (VESICARE) 5 MG tablet Take 5 mg by mouth at bedtime.       . tamsulosin (FLOMAX) 0.4 MG CAPS capsule Take 0.4 mg by mouth daily after supper.        No current facility-administered medications for this visit.    Physical Exam: BP 160/85  Pulse 60  Resp 20  Ht 5\' 10"  (1.778 m)  Wt 169 lb (76.658 kg)  BMI 24.25 kg/m2  SpO2 98% He looks well. Lung exam is clear. Cardiac exam shows a regular rate and rhythm with normal heart sounds. Chest incision is healing well and sternum is stable. There is no peripheral edema.   Diagnostic Tests:  CLINICAL DATA: Status post aortic valve replacement 11/15/2013.  EXAM:  CHEST 2 VIEW  COMPARISON: PA and lateral chest 11/17/2013.  FINDINGS:  Seven intact median sternotomy wires are unchanged. Prosthetic  aortic valve is identified. Right IJ sheath has been removed. The  lungs are clear. Heart  size is mildly enlarged. Small bilateral  pleural effusions seen on the prior study have resolved.  IMPRESSION:  Interval resolution of small bilateral pleural effusions.  Cardiomegaly without acute disease.  Electronically Signed  By: Inge Rise M.D.  On: 12/28/2013 09:58   Impression:  Overall I think he is doing well. I encouraged him to continue walking. I told him he could drive his car but should not lift anything heavier than 10 lbs for three months postop. His blood pressure is elevated today but he said that he stopped taking his lisinopril recently because the prescription ran out and there were no refills. His pulse is in the 60's so I would not increase his Lopressor. I reordered his lisinopril 10 mg daily.   Plan:  He will continue to followup with Dr. Einar Gip for his cardiology care and is planning to establish a primary care physician. He will return to see me if he develops any problems with his incisions.

## 2014-02-23 ENCOUNTER — Other Ambulatory Visit: Payer: Self-pay | Admitting: Thoracic Surgery (Cardiothoracic Vascular Surgery)

## 2014-06-01 ENCOUNTER — Encounter (HOSPITAL_COMMUNITY): Payer: Self-pay | Admitting: Cardiology

## 2014-09-04 DIAGNOSIS — F5101 Primary insomnia: Secondary | ICD-10-CM | POA: Insufficient documentation

## 2014-09-04 DIAGNOSIS — I10 Essential (primary) hypertension: Secondary | ICD-10-CM | POA: Diagnosis not present

## 2014-09-04 DIAGNOSIS — R7309 Other abnormal glucose: Secondary | ICD-10-CM | POA: Diagnosis not present

## 2014-09-04 DIAGNOSIS — E780 Pure hypercholesterolemia: Secondary | ICD-10-CM | POA: Diagnosis not present

## 2014-09-07 DIAGNOSIS — R7309 Other abnormal glucose: Secondary | ICD-10-CM | POA: Diagnosis not present

## 2014-09-07 DIAGNOSIS — E780 Pure hypercholesterolemia: Secondary | ICD-10-CM | POA: Diagnosis not present

## 2014-09-07 DIAGNOSIS — I10 Essential (primary) hypertension: Secondary | ICD-10-CM | POA: Diagnosis not present

## 2014-10-09 DIAGNOSIS — D696 Thrombocytopenia, unspecified: Secondary | ICD-10-CM | POA: Diagnosis not present

## 2014-10-17 DIAGNOSIS — Z87898 Personal history of other specified conditions: Secondary | ICD-10-CM | POA: Diagnosis not present

## 2014-10-17 DIAGNOSIS — N401 Enlarged prostate with lower urinary tract symptoms: Secondary | ICD-10-CM | POA: Diagnosis not present

## 2014-10-17 DIAGNOSIS — I10 Essential (primary) hypertension: Secondary | ICD-10-CM | POA: Diagnosis not present

## 2014-10-29 ENCOUNTER — Emergency Department
Admission: EM | Admit: 2014-10-29 | Discharge: 2014-10-29 | Disposition: A | Discharge status: Home / Self Care | Payer: Commercial Managed Care - HMO | Attending: Emergency Medicine | Admitting: Emergency Medicine

## 2014-10-29 DIAGNOSIS — Z7982 Long term (current) use of aspirin: Secondary | ICD-10-CM | POA: Insufficient documentation

## 2014-10-29 DIAGNOSIS — N23 Unspecified renal colic: Secondary | ICD-10-CM

## 2014-10-29 DIAGNOSIS — I10 Essential (primary) hypertension: Secondary | ICD-10-CM | POA: Insufficient documentation

## 2014-10-29 DIAGNOSIS — Z87891 Personal history of nicotine dependence: Secondary | ICD-10-CM | POA: Diagnosis not present

## 2014-10-29 DIAGNOSIS — Z79899 Other long term (current) drug therapy: Secondary | ICD-10-CM | POA: Diagnosis not present

## 2014-10-29 DIAGNOSIS — R1032 Left lower quadrant pain: Secondary | ICD-10-CM | POA: Diagnosis present

## 2014-10-29 DIAGNOSIS — R312 Other microscopic hematuria: Secondary | ICD-10-CM | POA: Diagnosis not present

## 2014-10-29 DIAGNOSIS — R3129 Other microscopic hematuria: Secondary | ICD-10-CM

## 2014-10-29 LAB — URINALYSIS COMPLETE WITH MICROSCOPIC (ARMC ONLY)
Bacteria, UA: NONE SEEN
Bilirubin Urine: NEGATIVE
Glucose, UA: NEGATIVE mg/dL
Ketones, ur: NEGATIVE mg/dL
Leukocytes, UA: NEGATIVE
NITRITE: NEGATIVE
PH: 5 (ref 5.0–8.0)
PROTEIN: NEGATIVE mg/dL
Specific Gravity, Urine: 1.016 (ref 1.005–1.030)
Squamous Epithelial / LPF: NONE SEEN

## 2014-10-29 LAB — CBC
HEMATOCRIT: 38.7 % — AB (ref 40.0–52.0)
Hemoglobin: 12.8 g/dL — ABNORMAL LOW (ref 13.0–18.0)
MCH: 30 pg (ref 26.0–34.0)
MCHC: 33.1 g/dL (ref 32.0–36.0)
MCV: 90.6 fL (ref 80.0–100.0)
Platelets: 144 10*3/uL — ABNORMAL LOW (ref 150–440)
RBC: 4.27 MIL/uL — AB (ref 4.40–5.90)
RDW: 15.6 % — ABNORMAL HIGH (ref 11.5–14.5)
WBC: 6.9 10*3/uL (ref 3.8–10.6)

## 2014-10-29 LAB — BASIC METABOLIC PANEL
Anion gap: 5 (ref 5–15)
BUN: 21 mg/dL — AB (ref 6–20)
CO2: 30 mmol/L (ref 22–32)
CREATININE: 1.04 mg/dL (ref 0.61–1.24)
Calcium: 8.7 mg/dL — ABNORMAL LOW (ref 8.9–10.3)
Chloride: 108 mmol/L (ref 101–111)
GFR calc Af Amer: >60 mL/min (ref >60)
GFR calc non Af Amer: >60 mL/min (ref >60)
GLUCOSE: 122 mg/dL — AB (ref 65–99)
Potassium: 3.9 mmol/L (ref 3.5–5.1)
Sodium: 143 mmol/L (ref 135–145)

## 2014-10-29 NOTE — ED Provider Notes (Signed)
[facility] Emergency Department Provider Note  ____________________________________________  Time seen: 7:45 PM  I have reviewed the triage vital signs and the nursing notes.   HISTORY  Chief Complaint Flank Pain    HPI Willie Freeman is a 69 y.o. male who complains of severe left lower quadrant abdominal pain and left flank pain started about 3:30 PM. The pain continued to worsen throughout the mid afternoon, but then stopped on arrival to the ED around 5:30. Since then he has been completely asymptomatic and feels totally fine. Noticed any dysuria or hematuria. No fever, chills, nausea, vomiting, chest pain, shortness of breath or back pain. No leg weakness. The left lower quadrant abdominal pain was sharp, severe, constant, worsened with sitting and better with standing.     Past Medical History  Diagnosis Date  . Back pain   . Myocardial infarction   . Hypertension   . Dysrhythmia   . Heart murmur   . Stroke     ministroke    Patient Active Problem List   Diagnosis Date Noted  . S/P AVR 11/15/2013  . TIA (transient ischemic attack) 11/02/2013    Past Surgical History  Procedure Laterality Date  . Hernia repair  1954    bilateral - inguinal   . Aortic valve replacement N/A 11/15/2013    Procedure: AORTIC VALVE REPLACEMENT (AVR);  Surgeon: Gaye Pollack, MD;  Location: [place];  Service: Open Heart Surgery;  Laterality: N/A;  . Intraoperative transesophageal echocardiogram N/A 11/15/2013    Procedure: INTRAOPERATIVE TRANSESOPHAGEAL ECHOCARDIOGRAM;  Surgeon: Gaye Pollack, MD;  Location: [facility] OR;  Service: Open Heart Surgery;  Laterality: N/A;  . Left heart catheterization with coronary angiogram N/A 11/02/2013    Procedure: LEFT HEART CATHETERIZATION WITH CORONARY ANGIOGRAM;  Surgeon: Laverda Page, MD;  Location: [facility] CATH LAB;  Service: Cardiovascular;  Laterality: N/A;    Current Outpatient Rx  Name  Route  Sig  Dispense  Refill  .  aspirin EC 325 MG EC tablet   Oral   Take 1 tablet (325 mg total) by mouth daily.         Marland Kitchen atorvastatin (LIPITOR) 10 MG tablet   Oral   Take 10 mg by mouth daily.         . clobetasol cream (TEMOVATE) 0.05 %   Topical   Apply 1 application topically 2 (two) times daily. prn         . finasteride (PROSCAR) 5 MG tablet   Oral   Take 5 mg by mouth daily with supper.          Marland Kitchen lisinopril (PRINIVIL,ZESTRIL) 10 MG tablet   Oral   Take 10 mg by mouth daily.         . metoprolol tartrate (LOPRESSOR) 25 MG tablet   Oral   Take 12.5 mg by mouth 2 (two) times daily.         . solifenacin (VESICARE) 5 MG tablet   Oral   Take 5 mg by mouth at bedtime.          . tamsulosin (FLOMAX) 0.4 MG CAPS capsule   Oral   Take 0.4 mg by mouth daily after supper.          . traMADol (ULTRAM) 50 MG tablet      TAKE ONE TABLET BY MOUTH EVERY 6 HOURS AS NEEDED FOR MODERATE PAIN   40 tablet   0     Allergies Review of patient's allergies  indicates no known allergies.  No family history on file.  Social History History  Substance Use Topics  . Smoking status: Former Smoker    Types: Cigars, Pipe  . Smokeless tobacco: Former Systems developer    Quit date: 11/11/1973  . Alcohol Use: Yes     Comment: Rarely- beer, glass of wine     Review of Systems  Constitutional: No fever or chills. No weight changes Eyes:No blurry vision or double vision.  ENT: No sore throat. Cardiovascular: No chest pain. Respiratory: No dyspnea or cough. Gastrointestinal: No vomiting or diarrhea  No BRBPR or melena. Genitourinary: Negative for dysuria, urinary retention, bloody urine, or difficulty urinating. Musculoskeletal: Negative for back pain. No joint swelling or pain. Skin: Negative for rash. Neurological: Negative for headaches, focal weakness or numbness. Psychiatric:No anxiety or depression.   Endocrine:No hot/cold intolerance, changes in energy, or sleep difficulty.  10-point ROS  otherwise negative.  ____________________________________________   PHYSICAL EXAM:  VITAL SIGNS: ED Triage Vitals  Enc Vitals Group     BP 10/29/14 1738 172/88 mmHg     Pulse Rate 10/29/14 1738 67     Resp 10/29/14 1738 16     Temp 10/29/14 1738 98.2 F (36.8 C)     Temp Source 10/29/14 1738 Oral     SpO2 10/29/14 1738 99 %     Weight 10/29/14 1738 160 lb (72.576 kg)     Height 10/29/14 1738 6' (1.829 m)     Head Cir --      Peak Flow --      Pain Score 10/29/14 1739 1     Pain Loc --      Pain Edu? --      Excl. in [place]? --      Constitutional: Alert and oriented. Well appearing and in no distress. Eyes: No scleral icterus. No conjunctival pallor. PERRL. EOMI ENT   Head: Normocephalic and atraumatic.   Nose: No congestion/rhinnorhea. No septal hematoma   Mouth/Throat: MMM, no pharyngeal erythema   Neck: No stridor. No SubQ emphysema.  Hematological/Lymphatic/Immunilogical: No cervical lymphadenopathy. Cardiovascular: RRR. Normal and symmetric distal pulses are present in all extremities. No murmurs, rubs, or gallops. Respiratory: Normal respiratory effort without tachypnea nor retractions. Breath sounds are clear and equal bilaterally. No wheezes/rales/rhonchi. Gastrointestinal: Soft and nontender. No distention. There is no CVA tenderness.  No rebound, rigidity, or guarding. Genitourinary: deferred Musculoskeletal: Nontender with normal range of motion in all extremities. No joint effusions.  No lower extremity tenderness.  No edema. Neurologic:   Normal speech and language.  CN 2-10 normal. Motor grossly intact. No pronator drift.  Normal gait. No gross focal neurologic deficits are appreciated.  Skin:  Skin is warm, dry and intact. No rash noted.  No petechiae, purpura, or bullae. Psychiatric: Mood and affect are normal. Speech and behavior are normal. Patient exhibits appropriate insight and judgment.  ____________________________________________     LABS (pertinent positives/negatives) (all labs ordered are listed, but only abnormal results are displayed) Labs Reviewed  BASIC METABOLIC PANEL - Abnormal; Notable for the following:    Glucose, Bld 122 (*)    BUN 21 (*)    Calcium 8.7 (*)    All other components within normal limits  CBC - Abnormal; Notable for the following:    RBC 4.27 (*)    Hemoglobin 12.8 (*)    HCT 38.7 (*)    RDW 15.6 (*)    Platelets 144 (*)    All other components within normal limits  URINALYSIS COMPLETEWITH MICROSCOPIC ([place])  - Abnormal; Notable for the following:    Color, Urine YELLOW (*)    APPearance CLEAR (*)    Hgb urine dipstick 2+ (*)    All other components within normal limits   ____________________________________________   EKG    ____________________________________________    RADIOLOGY    ____________________________________________   PROCEDURES  ____________________________________________   INITIAL IMPRESSION / ASSESSMENT AND PLAN / ED COURSE  Pertinent labs & imaging results that were available during my care of the patient were reviewed by me and considered in my medical decision making (see chart for details).  Patient's symptoms are consistent with renal colic. He has no prior history, but he does report that he drinks coffee. He needs a lot of salty foods. He does not drink a lot of water and is chronically mildly dehydrated. He is completely is not symptomatically at this time. He is to follow-up with his primary care doctor as scheduled, as he has an appointment coming up next month. I have low suspicion for AAA or aortic dissection or sepsis or surgical abdomen. We'll discharge the patient home. Condition good. Medically stable. Urinalysis only shows some red cells which is consistent with the presumptive diagnosis of renal colic and ureterolithiasis, which is now resolved ____________________________________________   FINAL CLINICAL IMPRESSION(S) / ED  DIAGNOSES  Final diagnoses:  Renal colic on left side  Microscopic hematuria      Carrie Mew, MD 10/29/14 2018

## 2014-10-29 NOTE — ED Notes (Signed)
Pt ambulated to bathroom at this time by self with no concerns. Pt tolerated well.

## 2014-10-29 NOTE — ED Notes (Signed)
Pt c/o sudden onset LLQ pain that radiates into the back that started around 330pm, upon arrival to triage pt states pain has stopped..denies a hx of kidney stones.Marland Kitchen

## 2014-10-29 NOTE — Discharge Instructions (Signed)
Hematuria Hematuria is blood in your urine. It can be caused by a bladder infection, kidney infection, prostate infection, kidney stone, or cancer of your urinary tract. Infections can usually be treated with medicine, and a kidney stone usually will pass through your urine. If neither of these is the cause of your hematuria, further workup to find out the reason may be needed. It is very important that you tell your health care provider about any blood you see in your urine, even if the blood stops without treatment or happens without causing pain. Blood in your urine that happens and then stops and then happens again can be a symptom of a very serious condition. Also, pain is not a symptom in the initial stages of many urinary cancers. HOME CARE INSTRUCTIONS   Drink lots of fluid, 3-4 quarts a day. If you have been diagnosed with an infection, cranberry juice is especially recommended, in addition to large amounts of water.  Avoid caffeine, tea, and carbonated beverages because they tend to irritate the bladder.  Avoid alcohol because it may irritate the prostate.  Take all medicines as directed by your health care provider.  If you were prescribed an antibiotic medicine, finish it all even if you start to feel better.  If you have been diagnosed with a kidney stone, follow your health care provider's instructions regarding straining your urine to catch the stone.  Empty your bladder often. Avoid holding urine for long periods of time.  After a bowel movement, women should cleanse front to back. Use each tissue only once.  Empty your bladder before and after sexual intercourse if you are a male. SEEK MEDICAL CARE IF:  You develop back pain.  You have a fever.  You have a feeling of sickness in your stomach (nausea) or vomiting.  Your symptoms are not better in 3 days. Return sooner if you are getting worse. SEEK IMMEDIATE MEDICAL CARE IF:   You develop severe vomiting and are  unable to keep the medicine down.  You develop severe back or abdominal pain despite taking your medicines.  You begin passing a large amount of blood or clots in your urine.  You feel extremely weak or faint, or you pass out. MAKE SURE YOU:   Understand these instructions.  Will watch your condition.  Will get help right away if you are not doing well or get worse. Document Released: 06/09/2005 Document Revised: 10/24/2013 Document Reviewed: 02/07/2013 [facility] Patient Information 2015 [place], [place]. This information is not intended to replace advice given to you by your health care provider. Make sure you discuss any questions you have with your health care provider.  Kidney Stones Kidney stones (urolithiasis) are deposits that form inside your kidneys. The intense pain is caused by the stone moving through the urinary tract. When the stone moves, the ureter goes into spasm around the stone. The stone is usually passed in the urine.  CAUSES   A disorder that makes certain neck glands produce too much parathyroid hormone (primary hyperparathyroidism).  A buildup of uric acid crystals, similar to gout in your joints.  Narrowing (stricture) of the ureter.  A kidney obstruction present at birth (congenital obstruction).  Previous surgery on the kidney or ureters.  Numerous kidney infections. SYMPTOMS   Feeling sick to your stomach (nauseous).  Throwing up (vomiting).  Blood in the urine (hematuria).  Pain that usually spreads (radiates) to the groin.  Frequency or urgency of urination. DIAGNOSIS   Taking a history and  physical exam.  Blood or urine tests.  CT scan.  Occasionally, an examination of the inside of the urinary bladder (cystoscopy) is performed. TREATMENT   Observation.  Increasing your fluid intake.  Extracorporeal shock wave lithotripsy--This is a noninvasive procedure that uses shock waves to break up kidney stones.  Surgery may be needed if  you have severe pain or persistent obstruction. There are various surgical procedures. Most of the procedures are performed with the use of small instruments. Only small incisions are needed to accommodate these instruments, so recovery time is minimized. The size, location, and chemical composition are all important variables that will determine the proper choice of action for you. Talk to your health care provider to better understand your situation so that you will minimize the risk of injury to yourself and your kidney.  HOME CARE INSTRUCTIONS   Drink enough water and fluids to keep your urine clear or pale yellow. This will help you to pass the stone or stone fragments.  Strain all urine through the provided strainer. Keep all particulate matter and stones for your health care provider to see. The stone causing the pain may be as small as a grain of salt. It is very important to use the strainer each and every time you pass your urine. The collection of your stone will allow your health care provider to analyze it and verify that a stone has actually passed. The stone analysis will often identify what you can do to reduce the incidence of recurrences.  Only take over-the-counter or prescription medicines for pain, discomfort, or fever as directed by your health care provider.  Make a follow-up appointment with your health care provider as directed.  Get follow-up X-rays if required. The absence of pain does not always mean that the stone has passed. It may have only stopped moving. If the urine remains completely obstructed, it can cause loss of kidney function or even complete destruction of the kidney. It is your responsibility to make sure X-rays and follow-ups are completed. Ultrasounds of the kidney can show blockages and the status of the kidney. Ultrasounds are not associated with any radiation and can be performed easily in a matter of minutes. SEEK MEDICAL CARE IF:  You experience pain  that is progressive and unresponsive to any pain medicine you have been prescribed. SEEK IMMEDIATE MEDICAL CARE IF:   Pain cannot be controlled with the prescribed medicine.  You have a fever or shaking chills.  The severity or intensity of pain increases over 18 hours and is not relieved by pain medicine.  You develop a new onset of abdominal pain.  You feel faint or pass out.  You are unable to urinate. MAKE SURE YOU:   Understand these instructions.  Will watch your condition.  Will get help right away if you are not doing well or get worse. Document Released: 06/09/2005 Document Revised: 02/09/2013 Document Reviewed: 11/10/2012 [facility] Patient Information 2015 [place], [place]. This information is not intended to replace advice given to you by your health care provider. Make sure you discuss any questions you have with your health care provider.

## 2014-11-15 DIAGNOSIS — N401 Enlarged prostate with lower urinary tract symptoms: Secondary | ICD-10-CM | POA: Diagnosis not present

## 2014-11-15 DIAGNOSIS — I10 Essential (primary) hypertension: Secondary | ICD-10-CM | POA: Diagnosis not present

## 2014-12-11 DIAGNOSIS — F5101 Primary insomnia: Secondary | ICD-10-CM | POA: Diagnosis not present

## 2014-12-11 DIAGNOSIS — E780 Pure hypercholesterolemia: Secondary | ICD-10-CM | POA: Diagnosis not present

## 2014-12-11 DIAGNOSIS — I10 Essential (primary) hypertension: Secondary | ICD-10-CM | POA: Diagnosis not present

## 2015-01-18 DIAGNOSIS — Z8673 Personal history of transient ischemic attack (TIA), and cerebral infarction without residual deficits: Secondary | ICD-10-CM | POA: Diagnosis not present

## 2015-01-18 DIAGNOSIS — Z954 Presence of other heart-valve replacement: Secondary | ICD-10-CM | POA: Diagnosis not present

## 2015-01-18 DIAGNOSIS — E785 Hyperlipidemia, unspecified: Secondary | ICD-10-CM | POA: Diagnosis not present

## 2015-01-18 DIAGNOSIS — I10 Essential (primary) hypertension: Secondary | ICD-10-CM | POA: Diagnosis not present

## 2015-03-27 IMAGING — CR DG CHEST 2V
2 series · 2 of 2 positions shown · non-contrast
Comparison: None.

CLINICAL DATA: Aortic valve disorder.

EXAM:
CHEST  2 VIEW

[w chest pa]
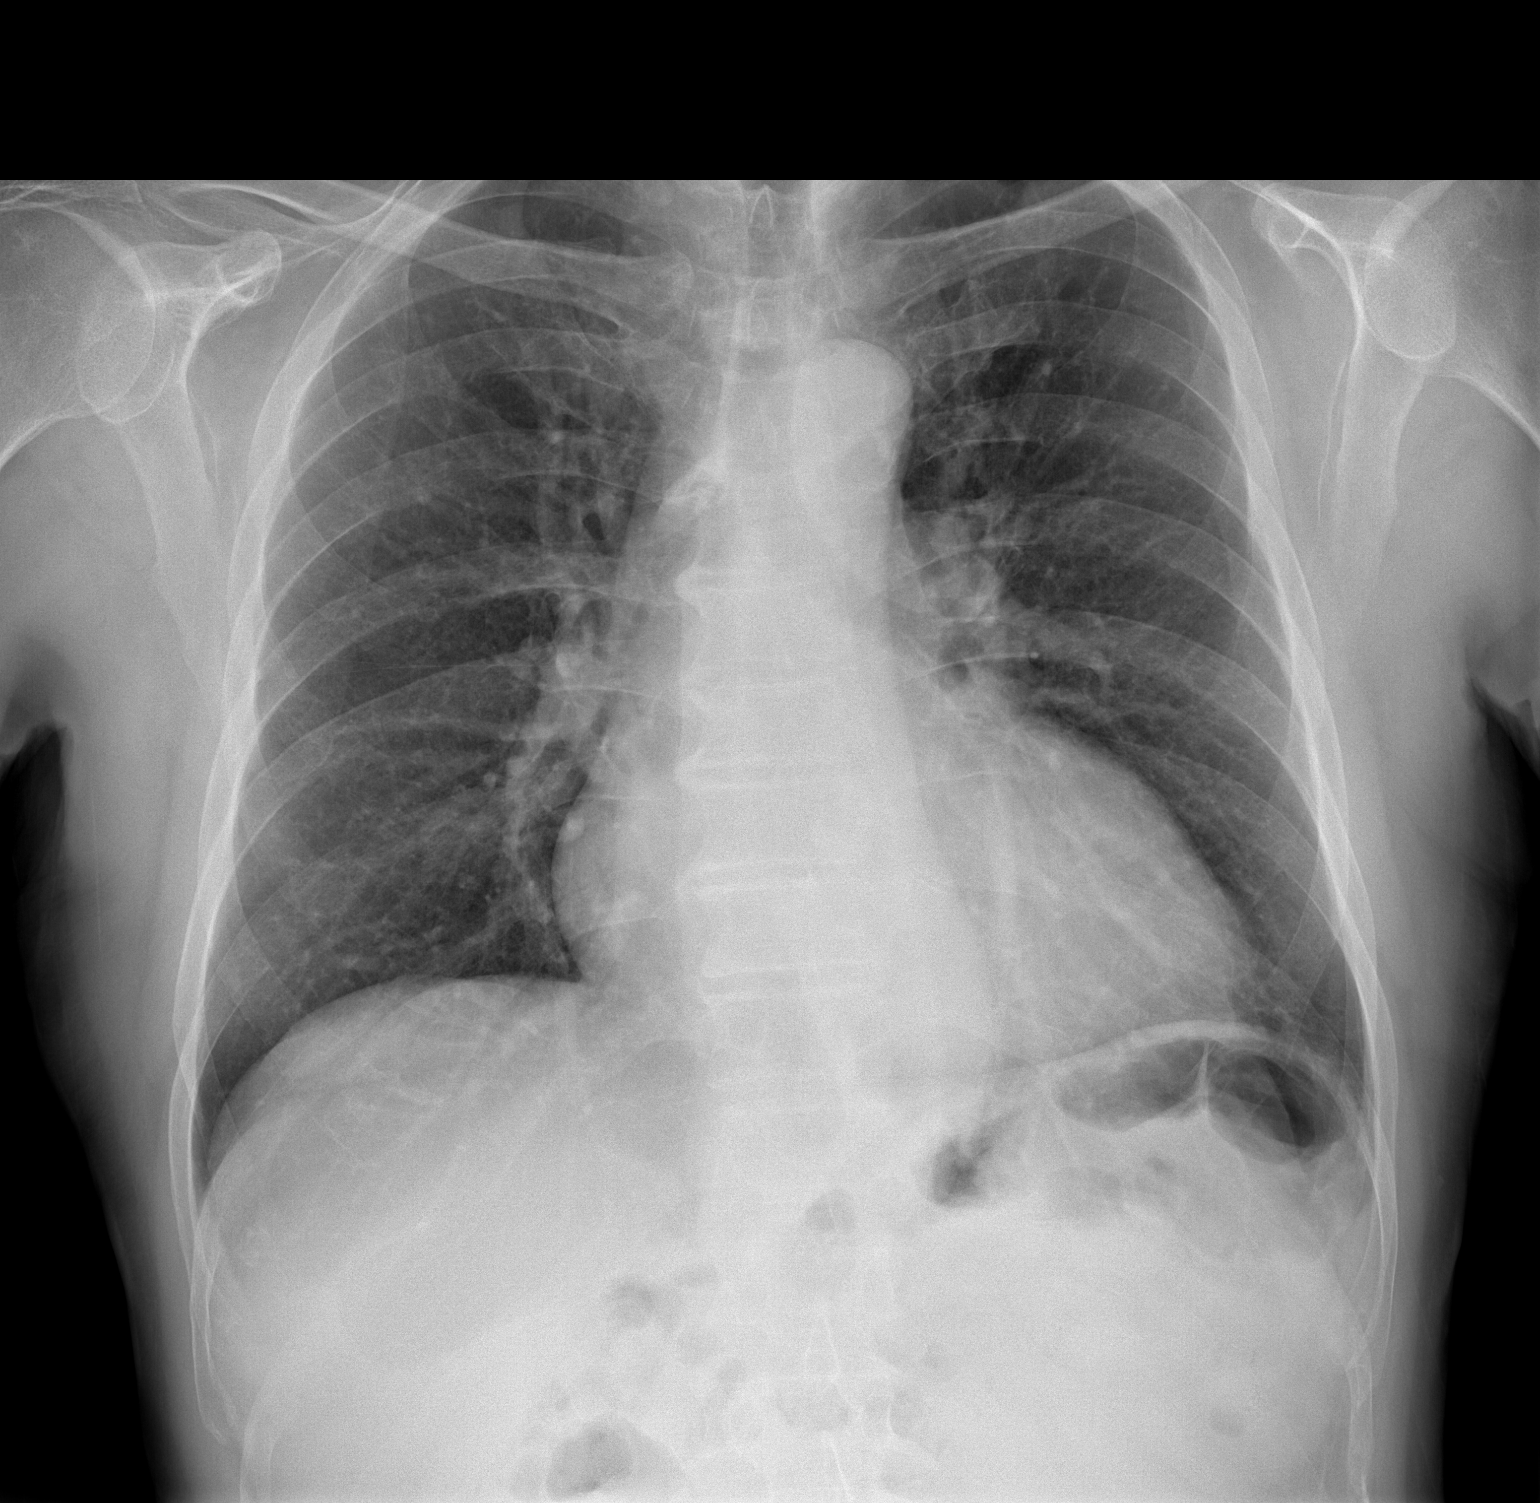

[w chest lat]
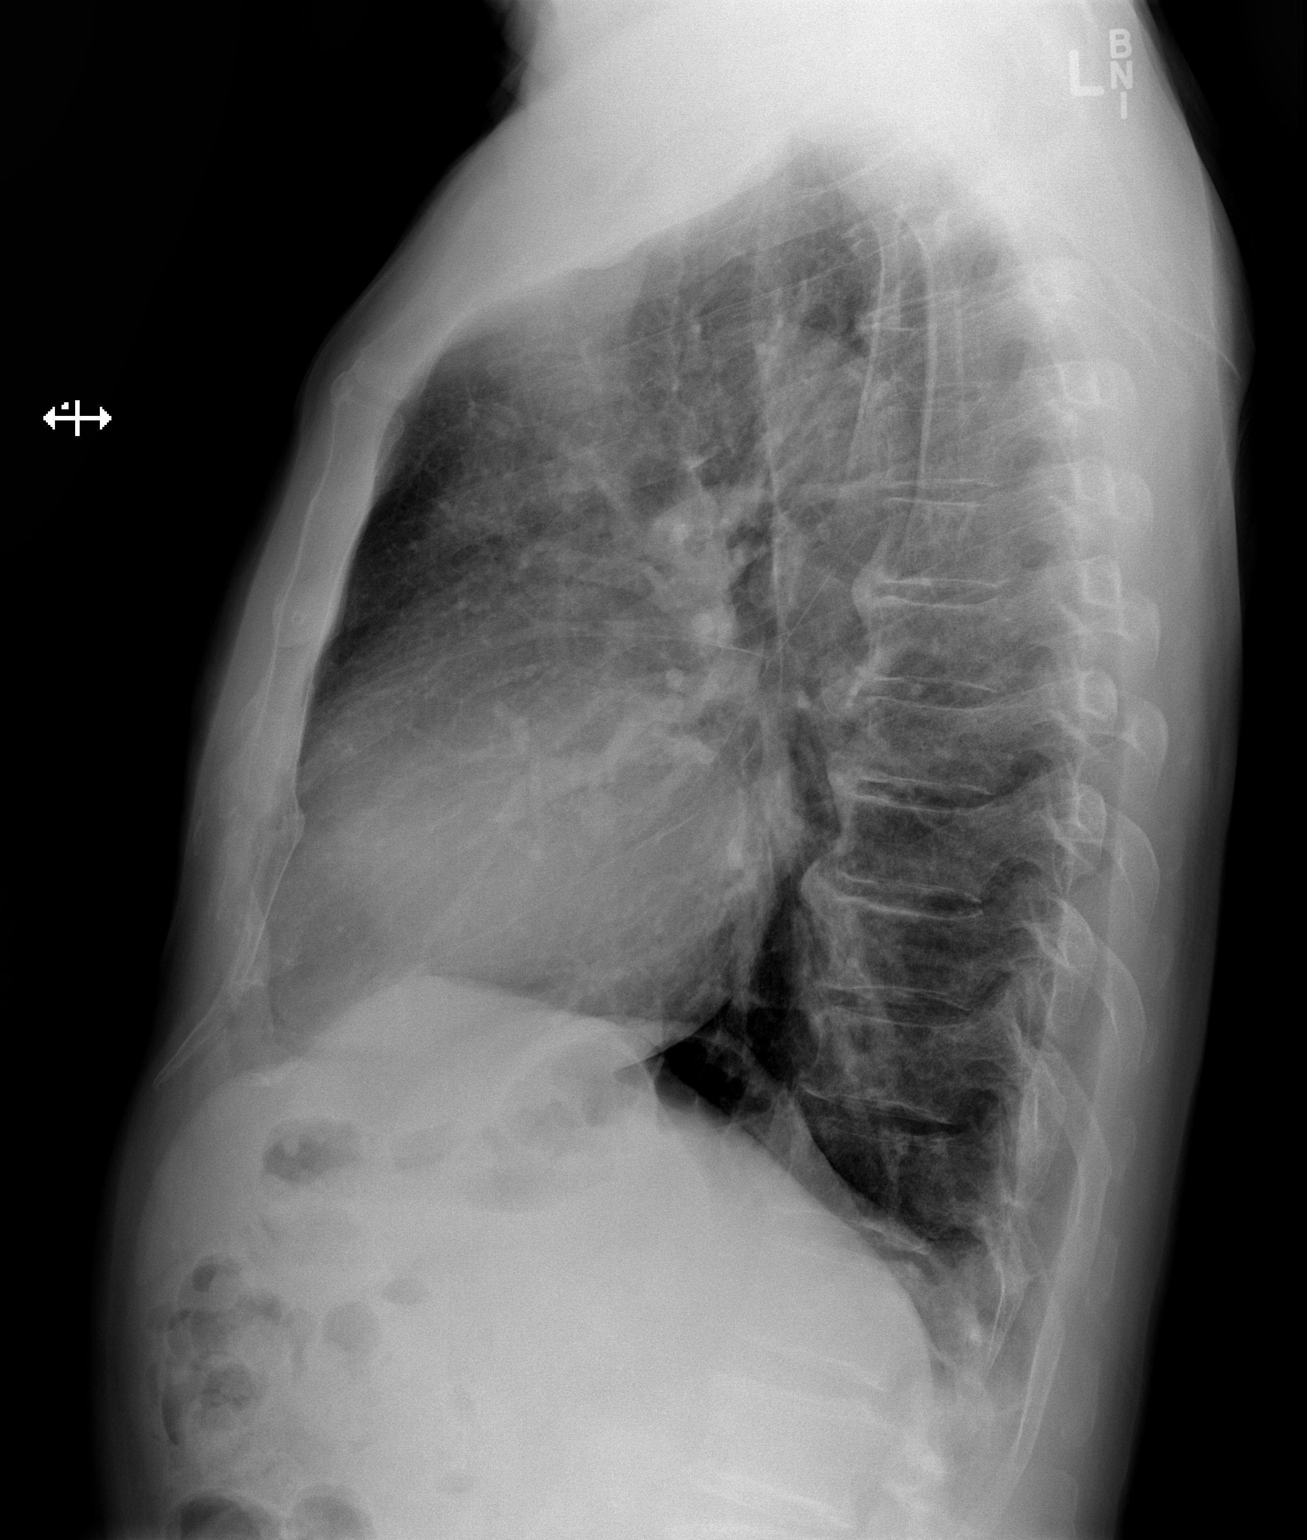

[2 of 2 positions shown; findings below may reference images not displayed]

FINDINGS: Mediastinum and hilar structures normal. Cardiomegaly. Normal
pulmonary vascularity. No pleural effusion or pneumothorax. Diffuse
degenerative changes thoracic spine.
IMPRESSION: No active cardiopulmonary disease.

## 2015-04-02 IMAGING — CR DG CHEST 2V
2 series · 2 of 2 positions shown · non-contrast
Comparison: 11/16/2013

CLINICAL DATA: Status post valve replacement

EXAM:
CHEST  2 VIEW

[w chest pa]
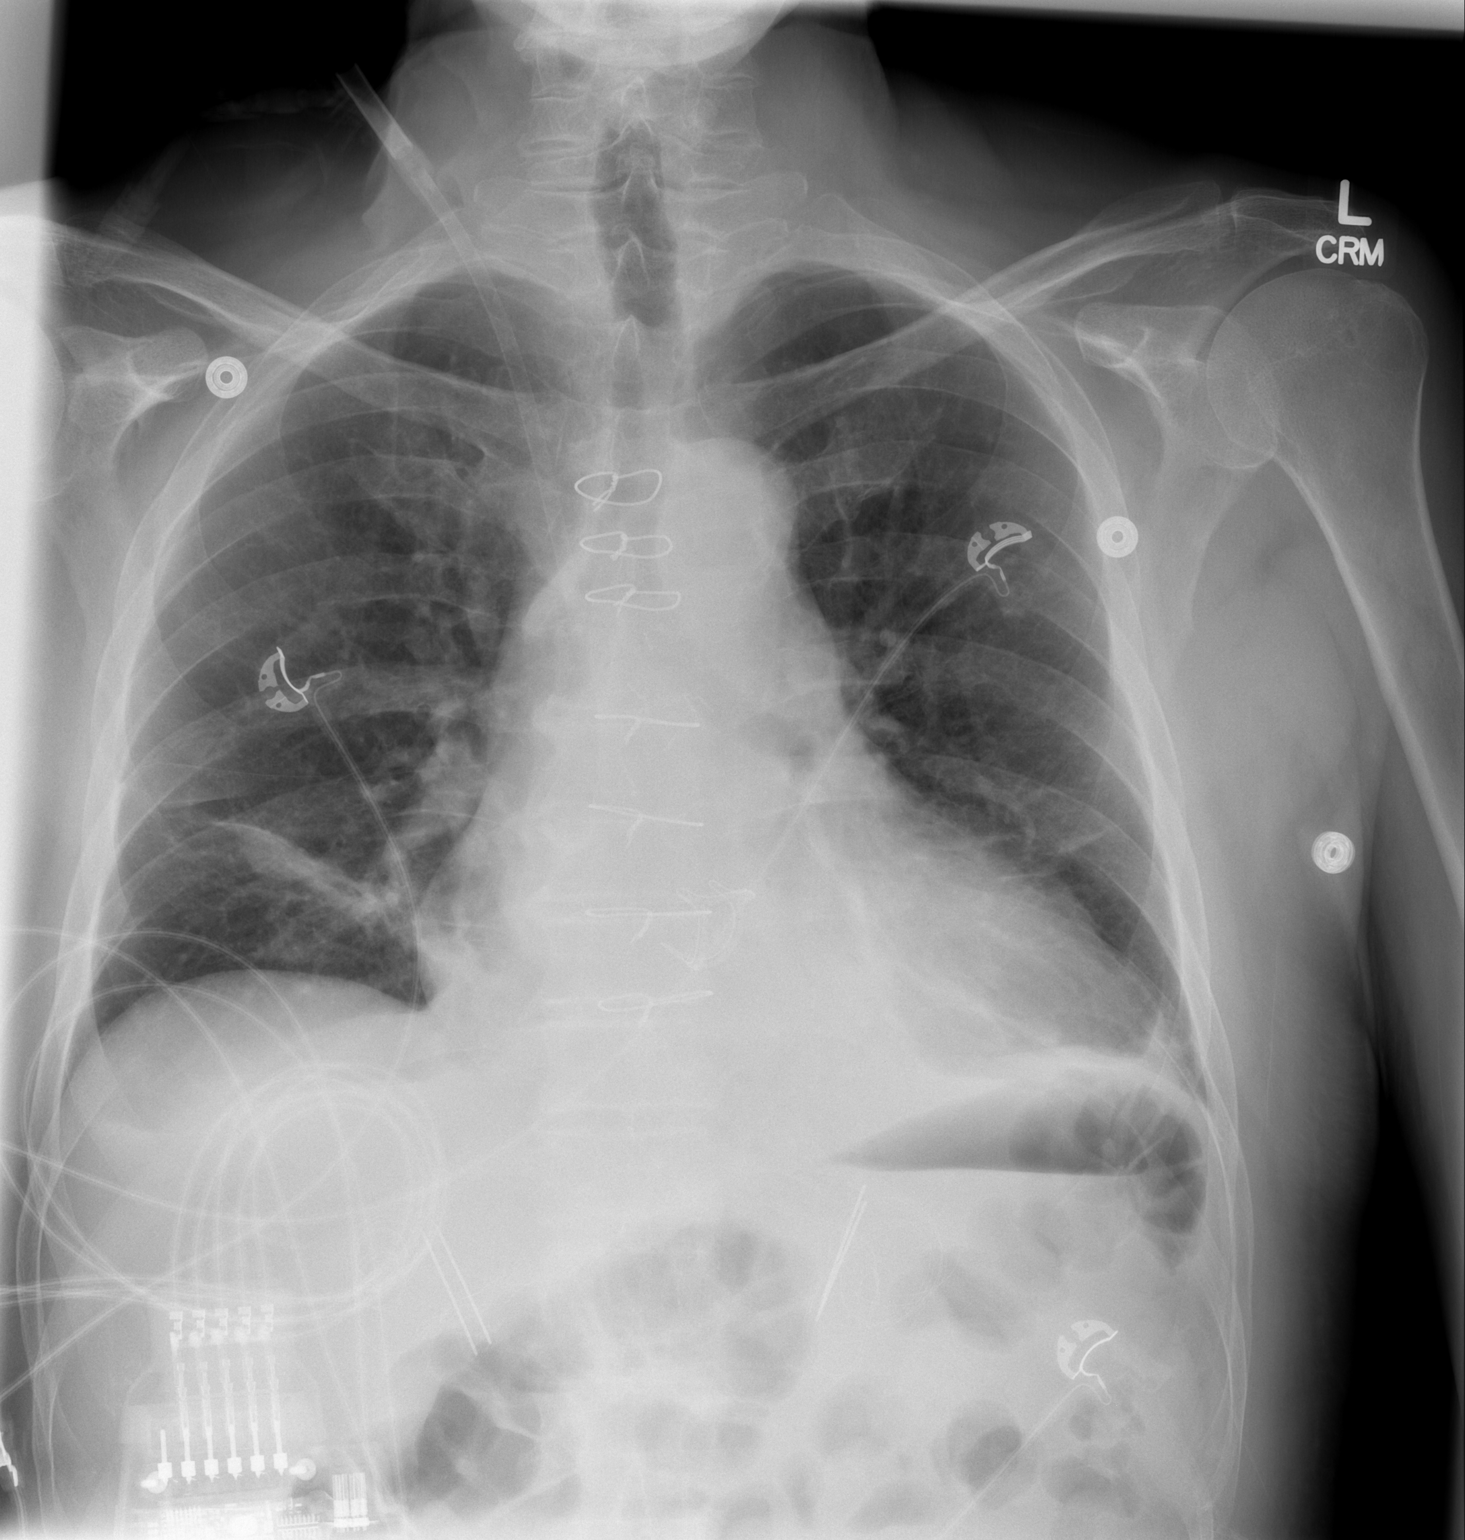

[w chest lat]
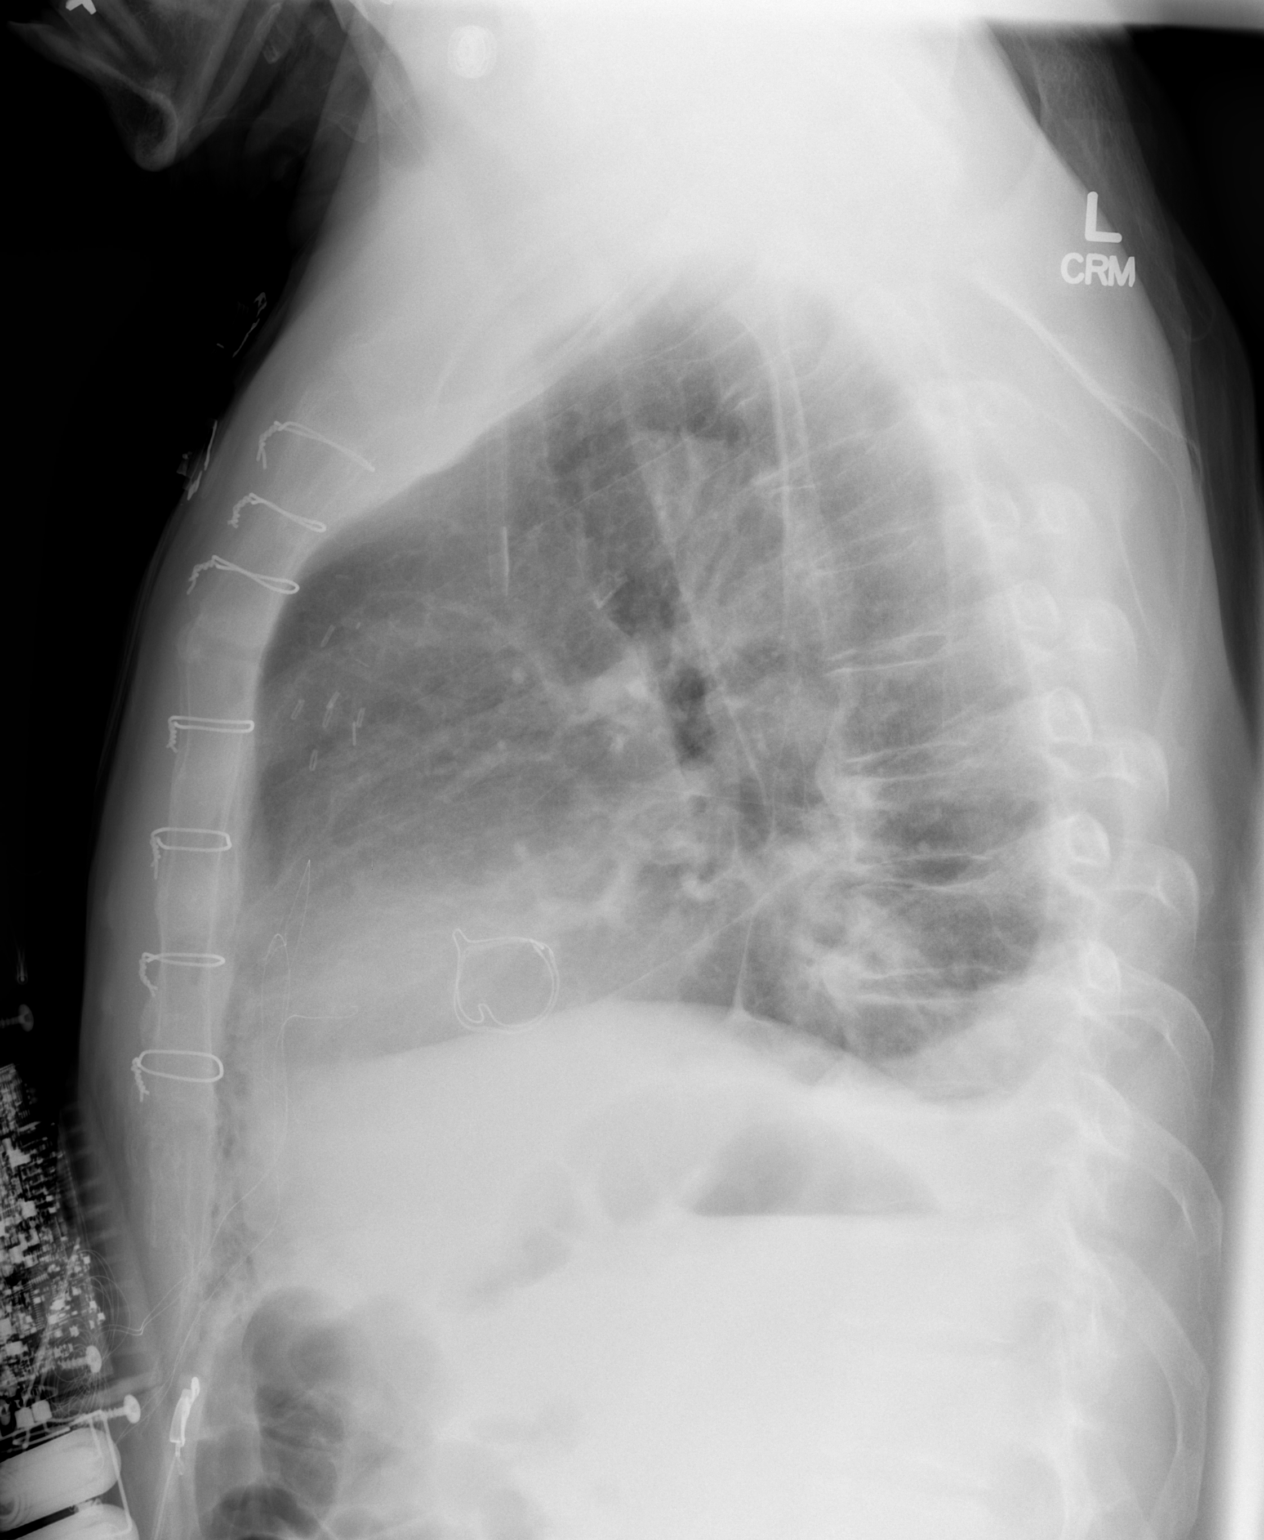

[2 of 2 positions shown; findings below may reference images not displayed]

FINDINGS: A Swan-Ganz catheter is been removed as has the mediastinal drain. A
right jugular sheath remains in place. The cardiac shadow remains
enlarged. Bibasilar platelike atelectasis is again noted and stable.
No sizable effusion is seen. No pneumothorax is noted.
IMPRESSION: Stable bilateral atelectatic changes.

No acute abnormality is seen.

## 2015-04-11 DIAGNOSIS — I10 Essential (primary) hypertension: Secondary | ICD-10-CM | POA: Diagnosis not present

## 2015-04-11 DIAGNOSIS — E7800 Pure hypercholesterolemia, unspecified: Secondary | ICD-10-CM | POA: Diagnosis not present

## 2015-04-17 DIAGNOSIS — I10 Essential (primary) hypertension: Secondary | ICD-10-CM | POA: Diagnosis not present

## 2015-04-17 DIAGNOSIS — E7800 Pure hypercholesterolemia, unspecified: Secondary | ICD-10-CM | POA: Diagnosis not present

## 2015-04-17 DIAGNOSIS — F5101 Primary insomnia: Secondary | ICD-10-CM | POA: Diagnosis not present

## 2015-05-10 ENCOUNTER — Encounter: Payer: Self-pay | Admitting: *Deleted

## 2015-05-13 IMAGING — CR DG CHEST 2V
2 series · 2 of 2 positions shown · non-contrast
Comparison: PA and lateral chest 11/17/2013.

CLINICAL DATA: Status post aortic valve replacement 11/15/2013.

EXAM:
CHEST  2 VIEW

[w chest pa]
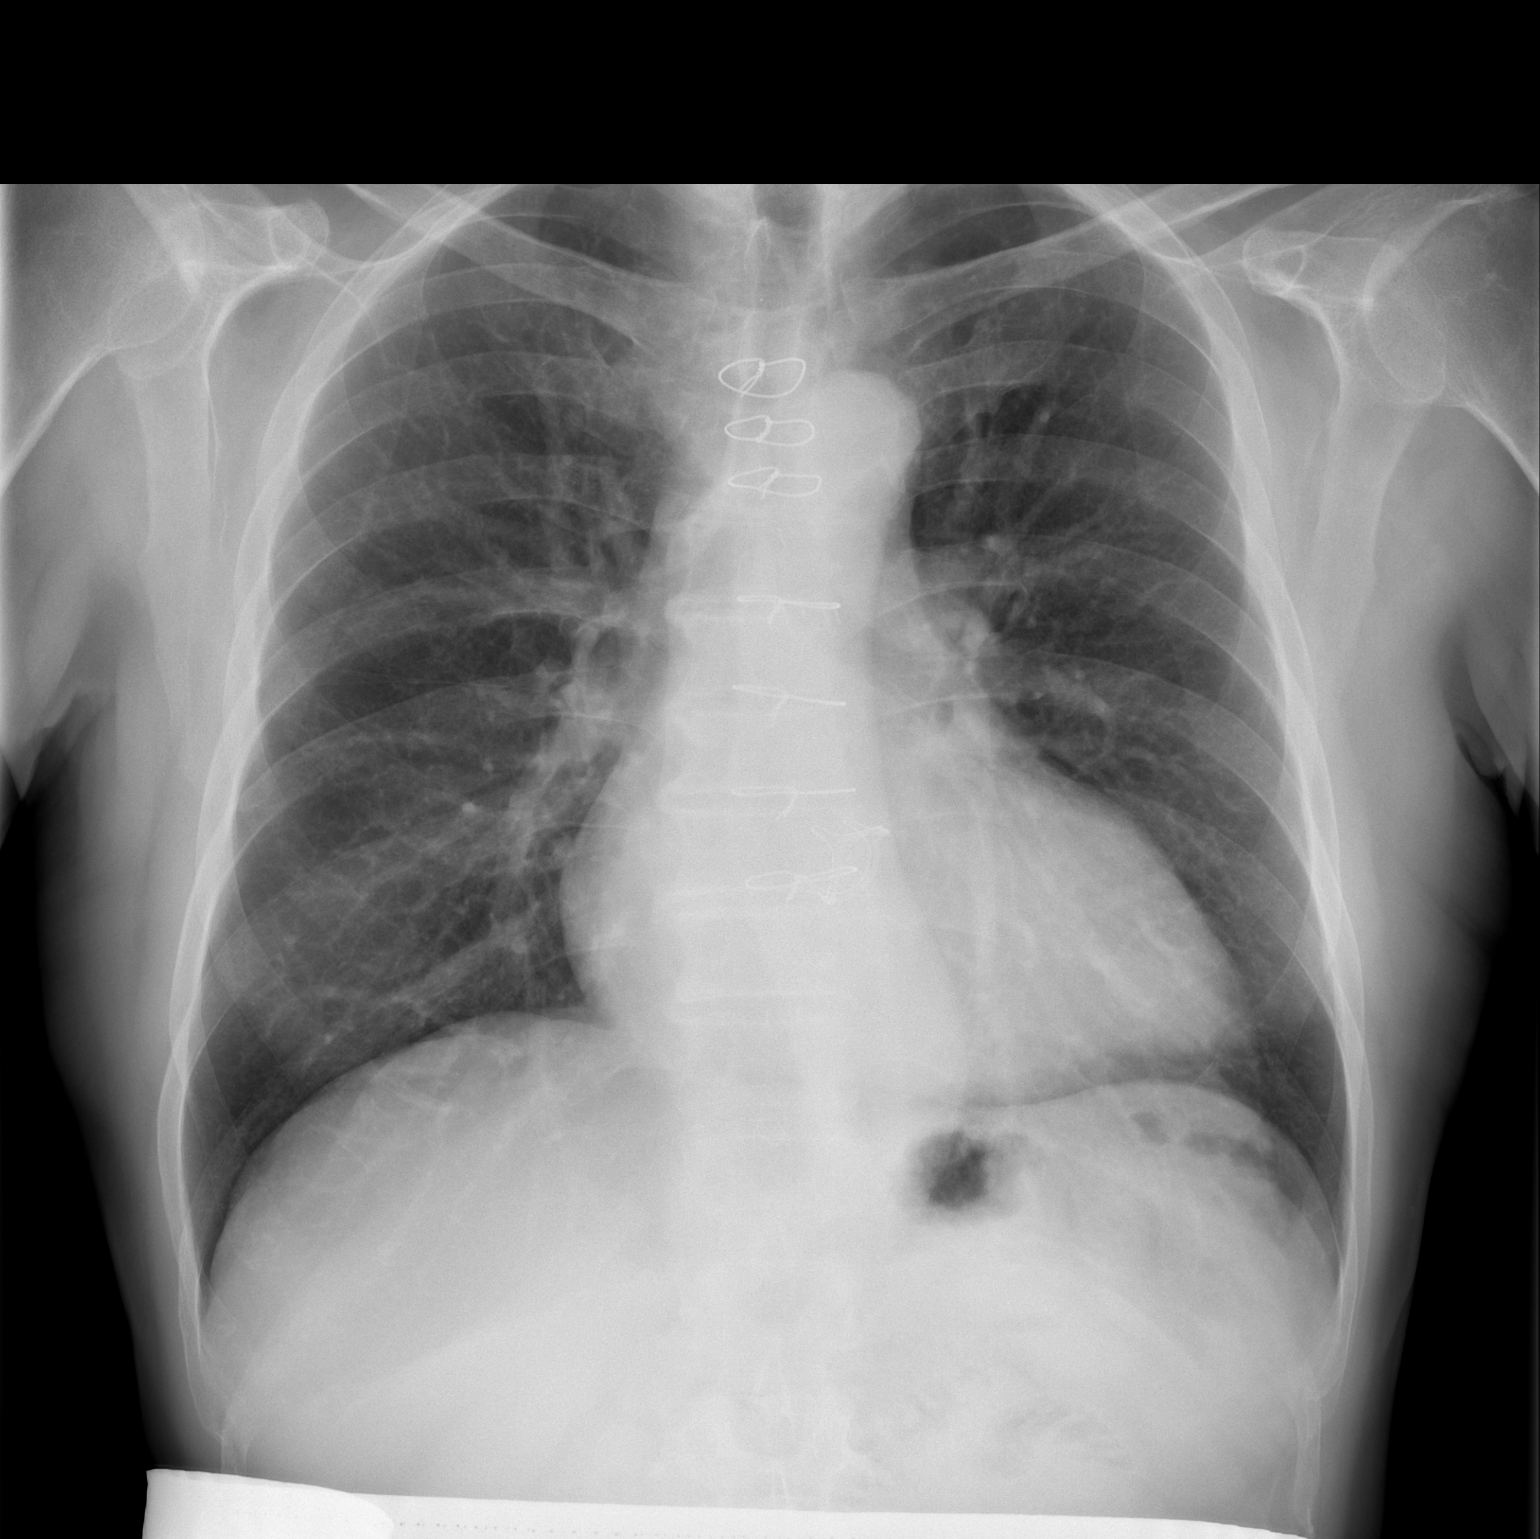

[w chest lat]
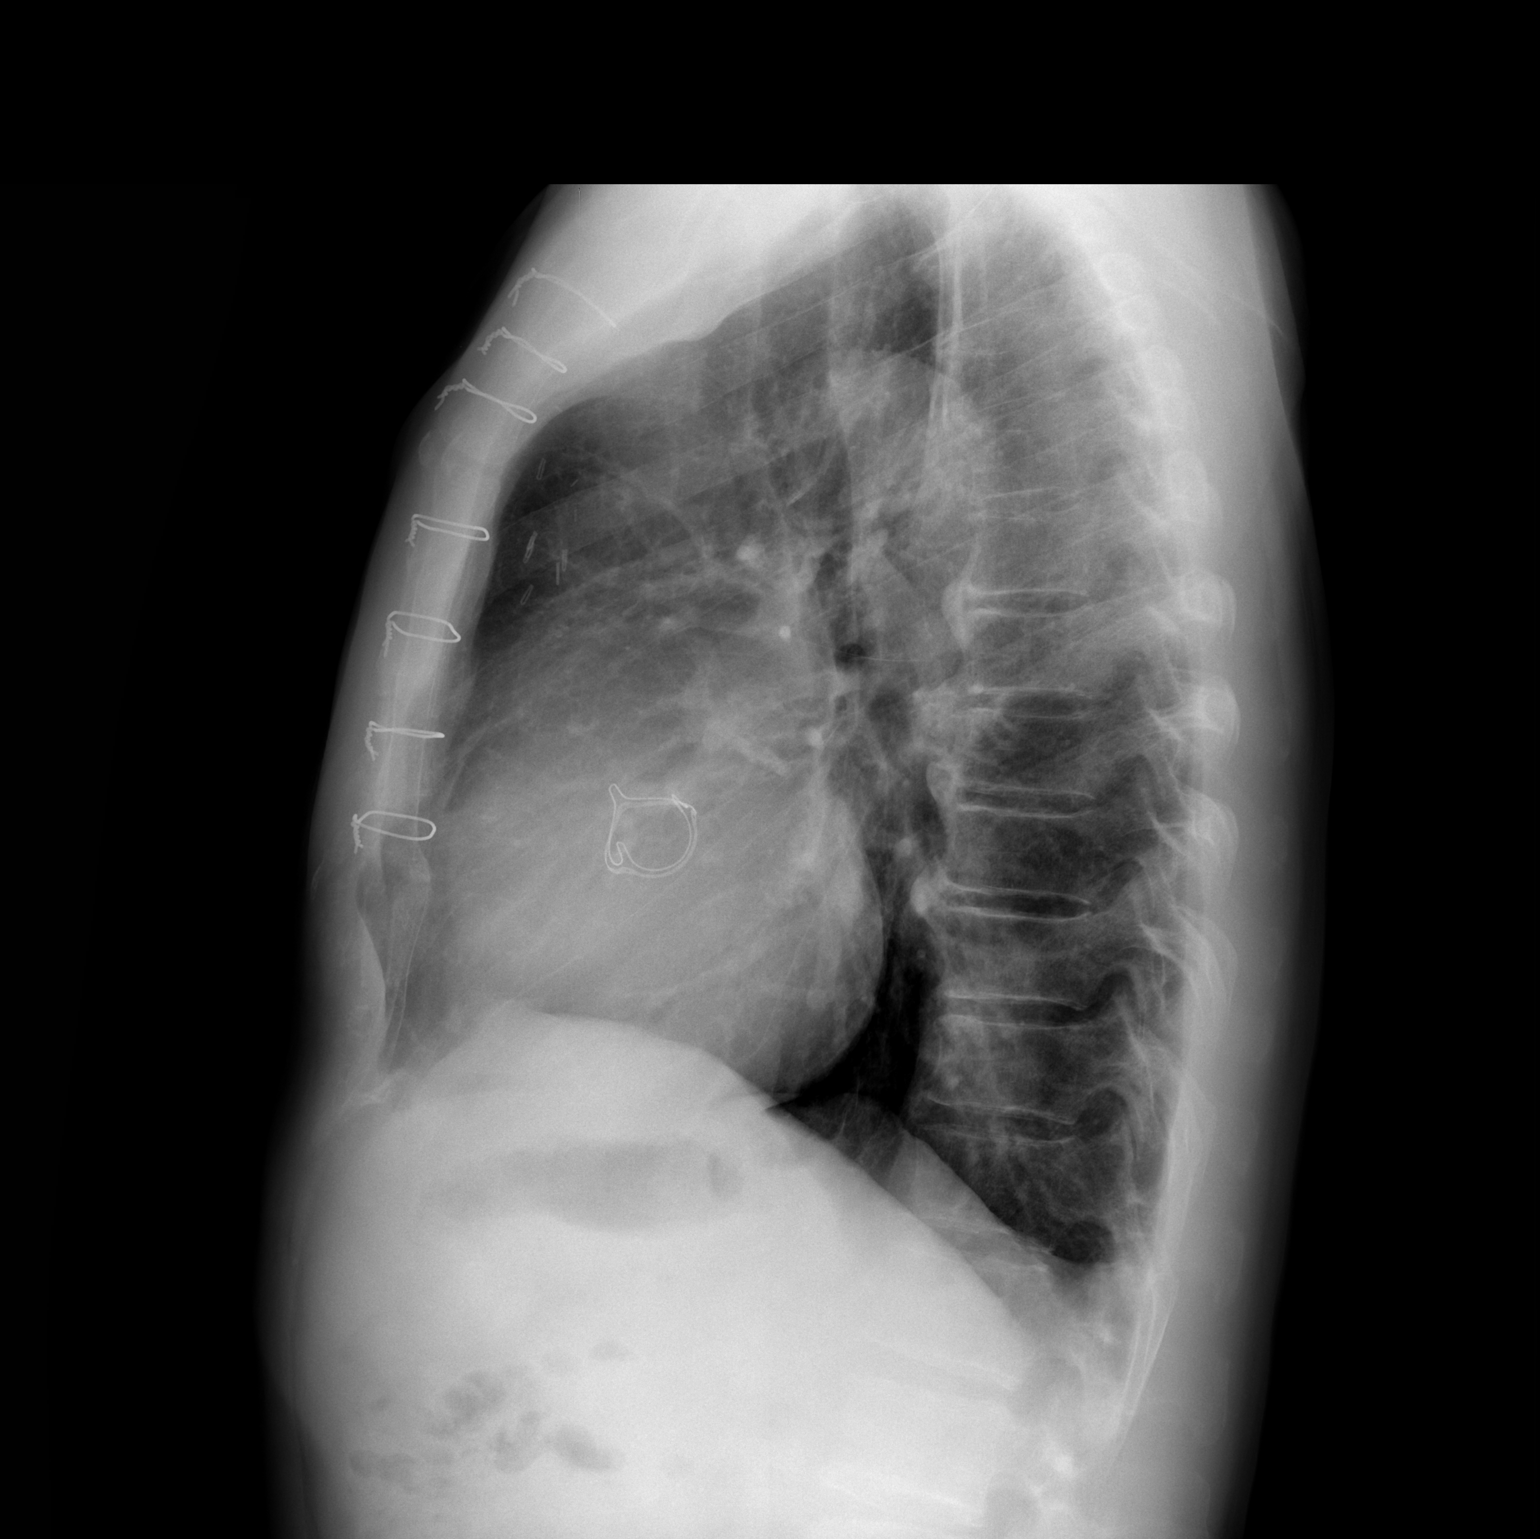

[2 of 2 positions shown; findings below may reference images not displayed]

FINDINGS: Seven intact median sternotomy wires are unchanged. Prosthetic
aortic valve is identified. Right IJ sheath has been removed. The
lungs are clear. Heart size is mildly enlarged. Small bilateral
pleural effusions seen on the prior study have resolved.
IMPRESSION: Interval resolution of small bilateral pleural effusions.

Cardiomegaly without acute disease.

## 2015-05-21 ENCOUNTER — Ambulatory Visit (INDEPENDENT_AMBULATORY_CARE_PROVIDER_SITE_OTHER): Payer: Commercial Managed Care - HMO | Admitting: Urology

## 2015-05-21 ENCOUNTER — Encounter: Payer: Self-pay | Admitting: Urology

## 2015-05-21 VITALS — BP 186/83 | HR 49 | Ht 72.0 in | Wt 158.3 lb

## 2015-05-21 DIAGNOSIS — Z87898 Personal history of other specified conditions: Secondary | ICD-10-CM | POA: Diagnosis not present

## 2015-05-21 DIAGNOSIS — N401 Enlarged prostate with lower urinary tract symptoms: Secondary | ICD-10-CM

## 2015-05-21 DIAGNOSIS — N138 Other obstructive and reflux uropathy: Secondary | ICD-10-CM | POA: Insufficient documentation

## 2015-05-21 HISTORY — DX: Personal history of other specified conditions: Z87.898

## 2015-05-21 NOTE — Progress Notes (Signed)
05/21/2015 8:51 AM   Willie Freeman 05/28/1946 [id number]  Referring provider: No referring provider defined for this encounter.  Chief Complaint  Patient presents with  . Benign Prostatic Hypertrophy    6 month follow up    HPI: Patient is 69 year old white male with history of elevated PSA and BPH with LUTS who presents today for a 6 month follow-up.  History of elevated PSA Patient has an elevated PSA of 5.5 ng/mL on 01/20/2012.   He was not wanting to undergo a biopsy, so finasteride was started.  The PSA has reduced in value incrementally since starting the finasteride with his last PSA being 1.2 ng/mL on 10/17/2014.  BPH WITH LUTS His IPSS score today is 5, which is mild lower urinary tract symptomatology. He is mostly satisfied with his quality life due to his urinary symptoms.  He denies any dysuria, hematuria or suprapubic pain.   He currently taking tamsulosin 0.4 mg daily, finasteride 5 mg daily and tolterodine LA 2 mg daily.      He also denies any recent fevers, chills, nausea or vomiting.   He has a family history of PCa, with his father having lethal prostate cancer.         IPSS      05/21/15 0800       International Prostate Symptom Score   How often have you had the sensation of not emptying your bladder? Less than 1 in 5     How often have you had to urinate less than every two hours? Less than half the time     How often have you found you stopped and started again several times when you urinated? Not at All     How often have you found it difficult to postpone urination? Not at All     How often have you had a weak urinary stream? Not at All     How often have you had to strain to start urination? Not at All     How many times did you typically get up at night to urinate? 2 Times     Total IPSS Score 5     Quality of Life due to urinary symptoms   If you were to spend the rest of your life with your urinary condition just the way it is now how would you  feel about that? Mostly Satisfied        Score:  1-7 Mild 8-19 Moderate 20-35 Severe     PMH: Past Medical History  Diagnosis Date  . Back pain   . Myocardial infarction ([place])   . Hypertension   . Dysrhythmia   . Heart murmur   . Stroke ([place])     ministroke  . Urinary frequency   . BPH (benign prostatic hypertrophy)   . Hydrocele   . Nocturia   . Elevated PSA     Surgical History: Past Surgical History  Procedure Laterality Date  . Hernia repair  1954    bilateral - inguinal   . Aortic valve replacement N/A 11/15/2013    Procedure: AORTIC VALVE REPLACEMENT (AVR);  Surgeon: Gaye Pollack, MD;  Location: [place];  Service: Open Heart Surgery;  Laterality: N/A;  . Intraoperative transesophageal echocardiogram N/A 11/15/2013    Procedure: INTRAOPERATIVE TRANSESOPHAGEAL ECHOCARDIOGRAM;  Surgeon: Gaye Pollack, MD;  Location: [facility] OR;  Service: Open Heart Surgery;  Laterality: N/A;  . Left heart catheterization with coronary angiogram N/A 11/02/2013    Procedure: LEFT  HEART CATHETERIZATION WITH CORONARY ANGIOGRAM;  Surgeon: Laverda Page, MD;  Location: [facility] CATH LAB;  Service: Cardiovascular;  Laterality: N/A;    Home Medications:    Medication List       This list is accurate as of: 05/21/15  8:51 AM.  Always use your most recent med list.               aspirin 325 MG EC tablet  Take 1 tablet (325 mg total) by mouth daily.     atorvastatin 10 MG tablet  Commonly known as:  LIPITOR  Take 10 mg by mouth daily.     clobetasol cream 0.05 %  Commonly known as:  TEMOVATE  Apply 1 application topically 2 (two) times daily. prn     finasteride 5 MG tablet  Commonly known as:  PROSCAR  Take 5 mg by mouth daily with supper.     lisinopril 10 MG tablet  Commonly known as:  PRINIVIL,ZESTRIL  Take 10 mg by mouth daily.     metoprolol tartrate 25 MG tablet  Commonly known as:  LOPRESSOR  Take 12.5 mg by mouth 2 (two) times daily.     tamsulosin 0.4 MG Caps  capsule  Commonly known as:  FLOMAX  Take 0.4 mg by mouth daily after supper.     tolterodine 2 MG 24 hr capsule  Commonly known as:  DETROL LA  Take by mouth.     traMADol 50 MG tablet  Commonly known as:  ULTRAM  TAKE ONE TABLET BY MOUTH EVERY 6 HOURS AS NEEDED FOR MODERATE PAIN     traZODone 50 MG tablet  Commonly known as:  DESYREL  Take by mouth.     triamcinolone cream 0.1 %  Commonly known as:  KENALOG  Apply topically.     valsartan 80 MG tablet  Commonly known as:  DIOVAN  Take by mouth.        Allergies: No Known Allergies  Family History: Family History  Problem Relation Age of Onset  . Prostate cancer Father   . Kidney disease Neg Hx     Social History:  reports that he has quit smoking. His smoking use included Cigars and Pipe. He quit smokeless tobacco use about 41 years ago. He reports that he drinks alcohol. He reports that he does not use illicit drugs.  ROS: UROLOGY Frequent Urination?: No Hard to postpone urination?: No Burning/pain with urination?: No Get up at night to urinate?: No Leakage of urine?: No Urine stream starts and stops?: No Trouble starting stream?: No Do you have to strain to urinate?: No Blood in urine?: No Urinary tract infection?: No Sexually transmitted disease?: No Injury to kidneys or bladder?: No Painful intercourse?: No Weak stream?: No Erection problems?: No Penile pain?: No  Gastrointestinal Nausea?: No Vomiting?: No Indigestion/heartburn?: No Diarrhea?: No Constipation?: No  Constitutional Fever: No Night sweats?: No Weight loss?: No Fatigue?: No  Skin Skin rash/lesions?: No Itching?: No  Eyes Blurred vision?: No Double vision?: No  Ears/Nose/Throat Sore throat?: No Sinus problems?: No  Hematologic/Lymphatic Swollen glands?: No Easy bruising?: No  Cardiovascular Leg swelling?: No Chest pain?: No  Respiratory Cough?: No Shortness of breath?: No  Endocrine Excessive thirst?:  No  Musculoskeletal Back pain?: No Joint pain?: No  Neurological Headaches?: No Dizziness?: No  Psychologic Depression?: No Anxiety?: No  Physical Exam: BP 186/83 mmHg  Pulse 49  Ht 6' (1.829 m)  Wt 158 lb 4.8 oz (71.804 kg)  BMI 21.46  kg/m2  GU: No CVA tenderness.  No bladder fullness or masses.  Patient with circumcised phallus.  Urethral meatus is patent.  No penile discharge. No penile lesions or rashes. Scrotum without lesions, cysts, rashes and/or edema.  Right hydrocele is noted.  Left testicles is located scrotally bilaterally. Could not palpate the right testicle due to the hydrocele.  No masses are appreciated in the testicles. Left and right epididymis are normal. Rectal: Patient with  normal sphincter tone. Anus and perineum without scarring or rashes. No rectal masses are appreciated. Prostate is approximately 60 grams, no nodules are appreciated. Seminal vesicles are normal.   Laboratory Data: Lab Results  Component Value Date   WBC 6.9 10/29/2014   HGB 12.8* 10/29/2014   HCT 38.7* 10/29/2014   MCV 90.6 10/29/2014   PLT 144* 10/29/2014    Lab Results  Component Value Date   CREATININE 1.04 10/29/2014    PSA history:  5.5 ng/mL on 01/20/2012  3.0 ng/mL on 03/04/2012  1.6 ng/mL on 09/06/2012  1.8 ng/mL on 03/28/2013  2.2 ng/mL on 04/17/2014  1.2 ng/mL on 10/17/2014     Lab Results  Component Value Date   HGBA1C 5.5 11/11/2013     Assessment & Plan:    1. BPH (benign prostatic hyperplasia) with LUTS:    I PSS score is 5/2. He will continue the tamsulosin, finasteride and tolterodine. He does not need refills for these medications at this time. He will return in 6 months for I PSS score, exam and PSA.  - PSA  2. History of elevated PSA:   Patient has a history of elevated PSA and a family history of lethal prostate cancer his father. We'll continue to monitor his PSA and will perform exams on a biannual basis.   Return in about 6 months  (around 11/18/2015) for IPSS score and exam.  Zara Council, [facility] [address] [place], [place] [address] [phone]

## 2015-05-22 ENCOUNTER — Telehealth: Payer: Self-pay

## 2015-05-22 DIAGNOSIS — R972 Elevated prostate specific antigen [PSA]: Secondary | ICD-10-CM

## 2015-05-22 LAB — PSA: Prostate Specific Ag, Serum: 1.1 ng/mL (ref 0.0–4.0)

## 2015-05-22 NOTE — Telephone Encounter (Signed)
LMOM- labs stable and well see him in 1 yr.

## 2015-05-22 NOTE — Telephone Encounter (Signed)
-----   Message from Nori Riis, PA-C sent at 05/22/2015  8:33 AM EST ----- Patient's PSA is stable.  We will see him in 12 months.  PSA to be drawn before his next appointment.

## 2015-06-06 DIAGNOSIS — I10 Essential (primary) hypertension: Secondary | ICD-10-CM | POA: Diagnosis not present

## 2015-06-06 DIAGNOSIS — Z954 Presence of other heart-valve replacement: Secondary | ICD-10-CM | POA: Diagnosis not present

## 2015-07-06 ENCOUNTER — Other Ambulatory Visit: Payer: Self-pay | Admitting: Urology

## 2015-07-06 DIAGNOSIS — N400 Enlarged prostate without lower urinary tract symptoms: Secondary | ICD-10-CM

## 2015-08-13 ENCOUNTER — Other Ambulatory Visit: Payer: Self-pay

## 2015-08-13 DIAGNOSIS — N3281 Overactive bladder: Secondary | ICD-10-CM

## 2015-08-13 MED ORDER — TOLTERODINE TARTRATE ER 2 MG PO CP24: 2.0000 mg | ORAL_CAPSULE | Freq: Every day | ORAL | Status: DC

## 2015-09-04 ENCOUNTER — Other Ambulatory Visit: Payer: Self-pay | Admitting: Urology

## 2015-09-04 DIAGNOSIS — Z954 Presence of other heart-valve replacement: Secondary | ICD-10-CM | POA: Diagnosis not present

## 2015-09-04 DIAGNOSIS — I10 Essential (primary) hypertension: Secondary | ICD-10-CM | POA: Diagnosis not present

## 2015-09-11 ENCOUNTER — Other Ambulatory Visit: Payer: Self-pay | Admitting: Urology

## 2015-11-08 DIAGNOSIS — Z7185 Encounter for immunization safety counseling: Secondary | ICD-10-CM | POA: Insufficient documentation

## 2015-11-08 DIAGNOSIS — I10 Essential (primary) hypertension: Secondary | ICD-10-CM | POA: Diagnosis not present

## 2015-11-08 DIAGNOSIS — Z0000 Encounter for general adult medical examination without abnormal findings: Secondary | ICD-10-CM | POA: Diagnosis not present

## 2015-11-08 DIAGNOSIS — Z23 Encounter for immunization: Secondary | ICD-10-CM | POA: Diagnosis not present

## 2015-11-08 DIAGNOSIS — E7800 Pure hypercholesterolemia, unspecified: Secondary | ICD-10-CM | POA: Diagnosis not present

## 2015-11-08 DIAGNOSIS — Z7189 Other specified counseling: Secondary | ICD-10-CM | POA: Insufficient documentation

## 2015-11-09 ENCOUNTER — Other Ambulatory Visit: Payer: Self-pay | Admitting: Urology

## 2015-11-09 DIAGNOSIS — Z862 Personal history of diseases of the blood and blood-forming organs and certain disorders involving the immune mechanism: Secondary | ICD-10-CM | POA: Insufficient documentation

## 2015-11-09 DIAGNOSIS — R7303 Prediabetes: Secondary | ICD-10-CM

## 2015-11-09 HISTORY — DX: Prediabetes: R73.03

## 2015-11-13 ENCOUNTER — Other Ambulatory Visit: Payer: Commercial Managed Care - HMO

## 2015-11-13 DIAGNOSIS — R972 Elevated prostate specific antigen [PSA]: Secondary | ICD-10-CM | POA: Diagnosis not present

## 2015-11-13 DIAGNOSIS — Z954 Presence of other heart-valve replacement: Secondary | ICD-10-CM | POA: Diagnosis not present

## 2015-11-13 DIAGNOSIS — I10 Essential (primary) hypertension: Secondary | ICD-10-CM | POA: Diagnosis not present

## 2015-11-14 LAB — PSA: Prostate Specific Ag, Serum: 1.2 ng/mL (ref 0.0–4.0)

## 2015-11-20 ENCOUNTER — Ambulatory Visit: Payer: PRIVATE HEALTH INSURANCE | Admitting: Urology

## 2015-11-22 ENCOUNTER — Encounter: Payer: Self-pay | Admitting: Urology

## 2015-11-22 ENCOUNTER — Ambulatory Visit (INDEPENDENT_AMBULATORY_CARE_PROVIDER_SITE_OTHER): Payer: Commercial Managed Care - HMO | Admitting: Urology

## 2015-11-22 VITALS — BP 130/71 | HR 63 | Ht 69.0 in | Wt 156.5 lb

## 2015-11-22 DIAGNOSIS — H6121 Impacted cerumen, right ear: Secondary | ICD-10-CM | POA: Diagnosis not present

## 2015-11-22 DIAGNOSIS — N401 Enlarged prostate with lower urinary tract symptoms: Secondary | ICD-10-CM | POA: Diagnosis not present

## 2015-11-22 DIAGNOSIS — Z87898 Personal history of other specified conditions: Secondary | ICD-10-CM | POA: Diagnosis not present

## 2015-11-22 DIAGNOSIS — N138 Other obstructive and reflux uropathy: Secondary | ICD-10-CM

## 2015-11-22 MED ORDER — TOLTERODINE TARTRATE ER 2 MG PO CP24: 2.0000 mg | ORAL_CAPSULE | Freq: Every day | ORAL | Status: DC

## 2015-11-22 MED ORDER — FINASTERIDE 5 MG PO TABS: 5.0000 mg | ORAL_TABLET | Freq: Every day | ORAL | Status: DC

## 2015-11-22 MED ORDER — TAMSULOSIN HCL 0.4 MG PO CAPS: 0.4000 mg | ORAL_CAPSULE | Freq: Every day | ORAL | Status: DC

## 2015-11-22 NOTE — Progress Notes (Signed)
4:36 PM   Willie Freeman 11/03/1945 [id number]  Referring provider: Carloyn Manner, MD [address] [place] [place],  [address]  Chief Complaint  Patient presents with  . Benign Prostatic Hypertrophy    6 month follow up    HPI: Patient is 70 year old Caucasian male with history of elevated PSA and BPH with LUTS who presents today for a 6 month follow-up.  History of elevated PSA Patient has an elevated PSA of 5.5 ng/mL on 01/20/2012.   He was not wanting to undergo a biopsy, so finasteride was started.  The PSA has reduced in value incrementally since starting the finasteride with his last PSA being 1.2 ng/mL on 11/13/2015.  BPH WITH LUTS His IPSS score today is 2, which is mild lower urinary tract symptomatology. He is mostly satisfied with his quality life due to his urinary symptoms.  He denies any dysuria, hematuria or suprapubic pain.   His previous IPSS score was 5/2.  He currently taking tamsulosin 0.4 mg daily, finasteride 5 mg daily and tolterodine LA 2 mg daily.  He also denies any recent fevers, chills, nausea or vomiting.   He has a family history of PCa, with his father having lethal prostate cancer.         IPSS      11/22/15 1600       International Prostate Symptom Score   How often have you had the sensation of not emptying your bladder? Not at All     How often have you had to urinate less than every two hours? Not at All     How often have you found you stopped and started again several times when you urinated? Not at All     How often have you found it difficult to postpone urination? Not at All     How often have you had a weak urinary stream? Less than 1 in 5 times     How often have you had to strain to start urination? Not at All     How many times did you typically get up at night to urinate? 1 Time     Total IPSS Score 2     Quality of Life due to urinary symptoms   If you were to spend the rest of your life with your  urinary condition just the way it is now how would you feel about that? Mostly Satisfied        Score:  1-7 Mild 8-19 Moderate 20-35 Severe   PMH: Past Medical History  Diagnosis Date  . Back pain   . Myocardial infarction ([place])   . Hypertension   . Dysrhythmia   . Heart murmur   . Stroke ([place])     ministroke  . Urinary frequency   . BPH (benign prostatic hypertrophy)   . Hydrocele   . Nocturia   . Elevated PSA     Surgical History: Past Surgical History  Procedure Laterality Date  . Hernia repair  1954    bilateral - inguinal   . Aortic valve replacement N/A 11/15/2013    Procedure: AORTIC VALVE REPLACEMENT (AVR);  Surgeon: Gaye Pollack, MD;  Location: [place];  Service: Open Heart Surgery;  Laterality: N/A;  . Intraoperative transesophageal echocardiogram N/A 11/15/2013    Procedure: INTRAOPERATIVE TRANSESOPHAGEAL ECHOCARDIOGRAM;  Surgeon: Gaye Pollack, MD;  Location: [facility] OR;  Service: Open Heart Surgery;  Laterality: N/A;  . Left heart catheterization with coronary angiogram N/A 11/02/2013  Procedure: LEFT HEART CATHETERIZATION WITH CORONARY ANGIOGRAM;  Surgeon: Laverda Page, MD;  Location: [facility] CATH LAB;  Service: Cardiovascular;  Laterality: N/A;    Home Medications:    Medication List       This list is accurate as of: 11/22/15  4:36 PM.  Always use your most recent med list.               aspirin 325 MG EC tablet  Take 1 tablet (325 mg total) by mouth daily.     atorvastatin 10 MG tablet  Commonly known as:  LIPITOR  Take 10 mg by mouth daily.     clobetasol cream 0.05 %  Commonly known as:  TEMOVATE  Apply 1 application topically 2 (two) times daily. prn     finasteride 5 MG tablet  Commonly known as:  PROSCAR  Take 1 tablet (5 mg total) by mouth daily.     lisinopril 10 MG tablet  Commonly known as:  PRINIVIL,ZESTRIL  Take 10 mg by mouth daily. Reported on 11/22/2015     metoprolol tartrate 25 MG tablet  Commonly known as:  LOPRESSOR    Take 12.5 mg by mouth 2 (two) times daily. Reported on 11/22/2015     tamsulosin 0.4 MG Caps capsule  Commonly known as:  FLOMAX  Take 1 capsule (0.4 mg total) by mouth daily.     tolterodine 2 MG 24 hr capsule  Commonly known as:  DETROL LA  Take 1 capsule (2 mg total) by mouth daily.     traMADol 50 MG tablet  Commonly known as:  ULTRAM  TAKE ONE TABLET BY MOUTH EVERY 6 HOURS AS NEEDED FOR MODERATE PAIN     traZODone 50 MG tablet  Commonly known as:  DESYREL  Take by mouth.     triamcinolone cream 0.1 %  Commonly known as:  KENALOG  Apply topically.     valsartan 80 MG tablet  Commonly known as:  DIOVAN  Take by mouth. Reported on 11/22/2015     valsartan-hydrochlorothiazide 160-12.5 MG tablet  Commonly known as:  DIOVAN-HCT  Reported on 11/22/2015     Vitamin A & D 10000-400 units Tabs  Reported on 11/22/2015        Allergies: No Known Allergies  Family History: Family History  Problem Relation Age of Onset  . Prostate cancer Father   . Kidney disease Neg Hx     Social History:  reports that he has quit smoking. His smoking use included Cigars and Pipe. He quit smokeless tobacco use about 42 years ago. He reports that he drinks alcohol. He reports that he does not use illicit drugs.  ROS: UROLOGY Frequent Urination?: No Hard to postpone urination?: No Burning/pain with urination?: No Get up at night to urinate?: No Leakage of urine?: No Urine stream starts and stops?: No Trouble starting stream?: No Do you have to strain to urinate?: No Blood in urine?: No Urinary tract infection?: No Sexually transmitted disease?: No Injury to kidneys or bladder?: No Painful intercourse?: No Weak stream?: No Erection problems?: No Penile pain?: No  Gastrointestinal Nausea?: No Vomiting?: No Indigestion/heartburn?: No Diarrhea?: No Constipation?: No  Constitutional Fever: No Night sweats?: No Weight loss?: No Fatigue?: No  Skin Skin rash/lesions?:  No Itching?: Yes  Eyes Blurred vision?: No Double vision?: No  Ears/Nose/Throat Sore throat?: No Sinus problems?: No  Hematologic/Lymphatic Swollen glands?: No Easy bruising?: No  Cardiovascular Leg swelling?: No Chest pain?: No  Respiratory Cough?: No Shortness  of breath?: No  Endocrine Excessive thirst?: No  Musculoskeletal Back pain?: No Joint pain?: No  Neurological Headaches?: No Dizziness?: No  Psychologic Depression?: No Anxiety?: No  Physical Exam: BP 130/71 mmHg  Pulse 63  Ht 5\' 9"  (1.753 m)  Wt 156 lb 8 oz (70.988 kg)  BMI 23.10 kg/m2  GU: No CVA tenderness.  No bladder fullness or masses.  Patient with circumcised phallus.  Urethral meatus is patent.  No penile discharge. No penile lesions or rashes. Scrotum without lesions, cysts, rashes and/or edema.  Right hydrocele is noted.  Left testicles is located scrotally bilaterally. Could not palpate the right testicle due to the hydrocele.  No masses are appreciated in the testicles. Left and right epididymis are normal. Rectal: Patient with  normal sphincter tone. Anus and perineum without scarring or rashes. No rectal masses are appreciated. Prostate is approximately 60 grams, no nodules are appreciated. Seminal vesicles are normal.   Laboratory Data: Lab Results  Component Value Date   WBC 6.9 10/29/2014   HGB 12.8* 10/29/2014   HCT 38.7* 10/29/2014   MCV 90.6 10/29/2014   PLT 144* 10/29/2014    Lab Results  Component Value Date   CREATININE 1.04 10/29/2014    PSA history:  5.5 ng/mL on 01/20/2012  3.0 ng/mL on 03/04/2012  1.6 ng/mL on 09/06/2012  1.8 ng/mL on 03/28/2013  2.2 ng/mL on 04/17/2014  1.2 ng/mL on 10/17/2014  1.1 ng/mL on 05/21/2015  1.2 ng/mL on 11/13/2015   Lab Results  Component Value Date   HGBA1C 5.5 11/11/2013     Assessment & Plan:    1. BPH (benign prostatic hyperplasia) with LUTS:    I PSS score is 2/2. He will continue the tamsulosin, finasteride and  tolterodine.  Refills for these medications are given at this time. He will return in 12 months for I PSS score, exam and PSA.  2. History of elevated PSA:   Patient has a history of elevated PSA and a family history of lethal prostate cancer his father.   PSA's have trended low for the last few years.  We will see him annually.     Return in about 1 year (around 11/21/2016) for IPSS, PSA and exam.  Zara Council, [facility]  [facility] [address] [place], [place] [address] [phone]

## 2015-11-23 DIAGNOSIS — Z1211 Encounter for screening for malignant neoplasm of colon: Secondary | ICD-10-CM | POA: Diagnosis not present

## 2015-11-23 DIAGNOSIS — Z7901 Long term (current) use of anticoagulants: Secondary | ICD-10-CM | POA: Diagnosis not present

## 2015-11-23 DIAGNOSIS — D696 Thrombocytopenia, unspecified: Secondary | ICD-10-CM | POA: Diagnosis not present

## 2015-12-18 DIAGNOSIS — Z954 Presence of other heart-valve replacement: Secondary | ICD-10-CM | POA: Diagnosis not present

## 2016-02-14 ENCOUNTER — Encounter: Payer: Self-pay | Admitting: *Deleted

## 2016-02-15 ENCOUNTER — Encounter: Payer: Self-pay | Admitting: *Deleted

## 2016-02-15 ENCOUNTER — Ambulatory Visit: Payer: Commercial Managed Care - HMO | Admitting: Anesthesiology

## 2016-02-15 ENCOUNTER — Ambulatory Visit
Admission: RE | Admit: 2016-02-15 | Discharge: 2016-02-15 | Disposition: A | Discharge status: Home / Self Care | Payer: Commercial Managed Care - HMO | Source: Ambulatory Visit | Attending: Gastroenterology | Admitting: Gastroenterology

## 2016-02-15 ENCOUNTER — Encounter
Admission: RE | Disposition: A | Discharge status: Home / Self Care | Payer: Self-pay | Source: Ambulatory Visit | Attending: Gastroenterology

## 2016-02-15 DIAGNOSIS — Z87891 Personal history of nicotine dependence: Secondary | ICD-10-CM | POA: Diagnosis not present

## 2016-02-15 DIAGNOSIS — K640 First degree hemorrhoids: Secondary | ICD-10-CM | POA: Diagnosis not present

## 2016-02-15 DIAGNOSIS — Z7982 Long term (current) use of aspirin: Secondary | ICD-10-CM | POA: Insufficient documentation

## 2016-02-15 DIAGNOSIS — I10 Essential (primary) hypertension: Secondary | ICD-10-CM | POA: Insufficient documentation

## 2016-02-15 DIAGNOSIS — Z79899 Other long term (current) drug therapy: Secondary | ICD-10-CM | POA: Diagnosis not present

## 2016-02-15 DIAGNOSIS — I252 Old myocardial infarction: Secondary | ICD-10-CM | POA: Diagnosis not present

## 2016-02-15 DIAGNOSIS — R351 Nocturia: Secondary | ICD-10-CM | POA: Diagnosis not present

## 2016-02-15 DIAGNOSIS — K5730 Diverticulosis of large intestine without perforation or abscess without bleeding: Secondary | ICD-10-CM | POA: Insufficient documentation

## 2016-02-15 DIAGNOSIS — Z1211 Encounter for screening for malignant neoplasm of colon: Secondary | ICD-10-CM | POA: Insufficient documentation

## 2016-02-15 DIAGNOSIS — N401 Enlarged prostate with lower urinary tract symptoms: Secondary | ICD-10-CM | POA: Insufficient documentation

## 2016-02-15 DIAGNOSIS — K648 Other hemorrhoids: Secondary | ICD-10-CM | POA: Diagnosis not present

## 2016-02-15 DIAGNOSIS — Z953 Presence of xenogenic heart valve: Secondary | ICD-10-CM | POA: Diagnosis not present

## 2016-02-15 DIAGNOSIS — Z8673 Personal history of transient ischemic attack (TIA), and cerebral infarction without residual deficits: Secondary | ICD-10-CM | POA: Diagnosis not present

## 2016-02-15 DIAGNOSIS — K5790 Diverticulosis of intestine, part unspecified, without perforation or abscess without bleeding: Secondary | ICD-10-CM | POA: Diagnosis not present

## 2016-02-15 HISTORY — PX: COLONOSCOPY WITH PROPOFOL: SHX5780

## 2016-02-15 LAB — CBC
HEMATOCRIT: 36.5 % — AB (ref 40.0–52.0)
Hemoglobin: 12.4 g/dL — ABNORMAL LOW (ref 13.0–18.0)
MCH: 30.4 pg (ref 26.0–34.0)
MCHC: 34 g/dL (ref 32.0–36.0)
MCV: 89.4 fL (ref 80.0–100.0)
Platelets: 128 10*3/uL — ABNORMAL LOW (ref 150–440)
RBC: 4.08 MIL/uL — ABNORMAL LOW (ref 4.40–5.90)
RDW: 15 % — AB (ref 11.5–14.5)
WBC: 4.7 10*3/uL (ref 3.8–10.6)

## 2016-02-15 SURGERY — COLONOSCOPY WITH PROPOFOL
Anesthesia: General

## 2016-02-15 MED ORDER — LIDOCAINE HCL (CARDIAC) 10 MG/ML IV SOLN
INTRAVENOUS | Status: DC | PRN
  Administered 2016-02-15: 40 mg via INTRAVENOUS

## 2016-02-15 MED ORDER — PROPOFOL 10 MG/ML IV BOLUS
INTRAVENOUS | Status: DC | PRN
  Administered 2016-02-15: 40 mg via INTRAVENOUS

## 2016-02-15 MED ORDER — SODIUM CHLORIDE 0.9 % IV SOLN
2.0000 g | Freq: Once | INTRAVENOUS | Status: AC
  Administered 2016-02-15: 2 g via INTRAVENOUS
  Filled 2016-02-15: qty 2000

## 2016-02-15 MED ORDER — SODIUM CHLORIDE 0.9 % IV SOLN: INTRAVENOUS | Status: DC

## 2016-02-15 MED ORDER — SODIUM CHLORIDE 0.9 % IV SOLN
INTRAVENOUS | Status: DC
  Administered 2016-02-15: 14:00:00 via INTRAVENOUS
  Administered 2016-02-15: 1000 mL via INTRAVENOUS

## 2016-02-15 MED ORDER — FENTANYL CITRATE (PF) 100 MCG/2ML IJ SOLN
INTRAMUSCULAR | Status: DC | PRN
  Administered 2016-02-15: 50 ug via INTRAVENOUS

## 2016-02-15 MED ORDER — MIDAZOLAM HCL 2 MG/2ML IJ SOLN
INTRAMUSCULAR | Status: DC | PRN
  Administered 2016-02-15: .5 mg via INTRAVENOUS

## 2016-02-15 MED ORDER — PROPOFOL 500 MG/50ML IV EMUL
INTRAVENOUS | Status: DC | PRN
  Administered 2016-02-15: 200 ug/kg/min via INTRAVENOUS

## 2016-02-15 NOTE — Anesthesia Preprocedure Evaluation (Signed)
Anesthesia Evaluation  Patient identified by MRN, date of birth, ID band Patient awake    Reviewed: Allergy & Precautions, H&P , NPO status , Patient's Chart, lab work & pertinent test results, reviewed documented beta blocker date and time   History of Anesthesia Complications Negative for: history of anesthetic complications  Airway Mallampati: II  TM Distance: >3 FB Neck ROM: full    Dental no notable dental hx. (+) Teeth Intact   Pulmonary neg pulmonary ROS, former smoker,    Pulmonary exam normal breath sounds clear to auscultation       Cardiovascular Exercise Tolerance: Good hypertension, (-) angina+ Past MI  (-) CAD, (-) Cardiac Stents and (-) CABG Normal cardiovascular exam+ dysrhythmias + Valvular Problems/Murmurs (s/p AVR) AS  Rhythm:regular Rate:Normal     Neuro/Psych neg Seizures TIACVA, No Residual Symptoms negative psych ROS   GI/Hepatic Neg liver ROS, GERD  ,  Endo/Other  negative endocrine ROS  Renal/GU negative Renal ROS  negative genitourinary   Musculoskeletal   Abdominal   Peds  Hematology negative hematology ROS (+)   Anesthesia Other Findings Past Medical History: No date: Back pain No date: BPH (benign prostatic hypertrophy) No date: Dysrhythmia No date: Elevated PSA No date: Heart murmur No date: Hydrocele No date: Hypertension No date: Myocardial infarction ([place]) No date: Nocturia No date: Stroke [facility])     Comment: ministroke No date: Urinary frequency   Reproductive/Obstetrics negative OB ROS                             Anesthesia Physical Anesthesia Plan  ASA: II  Anesthesia Plan: General   Post-op Pain Management:    Induction:   Airway Management Planned:   Additional Equipment:   Intra-op Plan:   Post-operative Plan:   Informed Consent: I have reviewed the patients History and Physical, chart, labs and discussed the procedure  including the risks, benefits and alternatives for the proposed anesthesia with the patient or authorized representative who has indicated his/her understanding and acceptance.   Dental Advisory Given  Plan Discussed with: Anesthesiologist, CRNA and Surgeon  Anesthesia Plan Comments:         Anesthesia Quick Evaluation

## 2016-02-15 NOTE — Transfer of Care (Signed)
Immediate Anesthesia Transfer of Care Note  Patient: Willie Freeman  Procedure(s) Performed: Procedure(s): COLONOSCOPY WITH PROPOFOL (N/A)  Patient Location: PACU  Anesthesia Type:General  Level of Consciousness: awake  Airway & Oxygen Therapy: Patient Spontanous Breathing and Patient connected to nasal cannula oxygen  Post-op Assessment: Report given to RN and Post -op Vital signs reviewed and stable  Post vital signs: Reviewed and stable  Last Vitals:  Vitals:   02/15/16 1257  BP: (!) 159/80  Pulse: (!) 59  Resp: 18  Temp: 36.3 C    Last Pain:  Vitals:   02/15/16 1257  TempSrc: Tympanic         Complications: No apparent anesthesia complications

## 2016-02-15 NOTE — Anesthesia Procedure Notes (Signed)
Date/Time: 02/15/2016 2:00 PM Performed by: Allean Found Pre-anesthesia Checklist: Patient identified, Emergency Drugs available, Suction available, Patient being monitored and Timeout performed Patient Re-evaluated:Patient Re-evaluated prior to inductionOxygen Delivery Method: Nasal cannula Intubation Type: IV induction

## 2016-02-15 NOTE — H&P (Signed)
Outpatient short stay form Pre-procedure 02/15/2016 1:51 PM Lollie Sails MD  Primary Physician: Dr Dion Body  Reason for visit:  Colonoscopy  History of present illness:  Patient is a 70 year old male presenting today for screening colonoscopy. He's never had a colonoscopy before. He has had a aortic valve replacement/bovine type. That procedure was about 2 years ago. He has done well with this. He does take an 81 mg aspirin daily. He takes no other blood thinning agents.    Current Facility-Administered Medications:  .  0.9 %  sodium chloride infusion, , Intravenous, Continuous, Lollie Sails, MD, Last Rate: 20 mL/hr at 02/15/16 1321, 1,000 mL at 02/15/16 1321 .  0.9 %  sodium chloride infusion, , Intravenous, Continuous, Lollie Sails, MD .  ampicillin (OMNIPEN) 2 g in sodium chloride 0.9 % 50 mL IVPB, 2 g, Intravenous, Once, Lollie Sails, MD, 2 g at 02/15/16 1338  Prescriptions Prior to Admission  Medication Sig Dispense Refill Last Dose  . aspirin EC 325 MG EC tablet Take 1 tablet (325 mg total) by mouth daily.   02/10/2016 at Unknown time  . atorvastatin (LIPITOR) 10 MG tablet Take 10 mg by mouth daily.   02/14/2016 at Unknown time  . clobetasol cream (TEMOVATE) [id number] % Apply 1 application topically 2 (two) times daily. prn   02/14/2016 at Unknown time  . finasteride (PROSCAR) 5 MG tablet Take 1 tablet (5 mg total) by mouth daily. 90 tablet 3 02/14/2016 at Unknown time  . tamsulosin (FLOMAX) 0.4 MG CAPS capsule Take 1 capsule (0.4 mg total) by mouth daily. 90 capsule 4 02/14/2016 at Unknown time  . tolterodine (DETROL LA) 2 MG 24 hr capsule Take 1 capsule (2 mg total) by mouth daily. 90 capsule 3 02/14/2016 at Unknown time  . triamcinolone cream (KENALOG) 0.1 % Apply topically.   02/14/2016 at Unknown time  . lisinopril (PRINIVIL,ZESTRIL) 10 MG tablet Take 10 mg by mouth daily. Reported on 11/22/2015   Not Taking at Unknown time  . metoprolol tartrate (LOPRESSOR) 25 MG  tablet Take 12.5 mg by mouth 2 (two) times daily. Reported on 11/22/2015   Not Taking  . traMADol (ULTRAM) 50 MG tablet TAKE ONE TABLET BY MOUTH EVERY 6 HOURS AS NEEDED FOR MODERATE PAIN (Patient not taking: Reported on 05/21/2015) 40 tablet 0 Not Taking  . traZODone (DESYREL) 50 MG tablet Take by mouth.   Not Taking at Unknown time  . valsartan (DIOVAN) 80 MG tablet Take by mouth. Reported on 11/22/2015   Not Taking at Unknown time  . valsartan-hydrochlorothiazide (DIOVAN-HCT) 160-12.5 MG tablet Reported on 11/22/2015   Not Taking  . Vitamins A & D (VITAMIN A & D) 10000-400 units TABS Reported on 11/22/2015   Not Taking at Unknown time     No Known Allergies   Past Medical History:  Diagnosis Date  . Back pain   . BPH (benign prostatic hypertrophy)   . Dysrhythmia   . Elevated PSA   . Heart murmur   . Hydrocele   . Hypertension   . Myocardial infarction ([place])   . Nocturia   . Stroke ([place])    ministroke  . Urinary frequency     Review of systems:      Physical Exam    Heart and lungs: Regular rate and rhythm without rub or gallop, lungs are bilaterally clear.    HEENT: Normocephalic atraumatic eyes are anicteric    Other:     Pertinant exam for procedure: Soft  nontender nondistended bowel sounds positive normoactive.    Planned proceedures: Colonoscopy and indicated procedures. I have discussed the risks benefits and complications of procedures to include not limited to bleeding, infection, perforation and the risk of sedation and the patient wishes to proceed.    Lollie Sails, MD Gastroenterology 02/15/2016  1:51 PM

## 2016-02-15 NOTE — Op Note (Signed)
[facility] Patient Name: Willie Freeman Procedure Date: 02/15/2016 12:53 PM MRN: [id number] Account #: [id number] Date of Birth: [birth year] Admit Type: Outpatient Age: 70 Room: [facility] ENDO ROOM 1 Gender: Male Note Status: Finalized Procedure:            Colonoscopy Indications:          Screening for colorectal malignant neoplasm, This is                        the patient's first colonoscopy Providers:            Lollie Sails, MD Referring MD:         Dion Body (Referring MD) Medicines:            Monitored Anesthesia Care Complications:        No immediate complications. Procedure:            Pre-Anesthesia Assessment:                       - ASA Grade Assessment: III - A patient with severe                        systemic disease.                       After obtaining informed consent, the colonoscope was                        passed under direct vision. Throughout the procedure,                        the patient's blood pressure, pulse, and oxygen                        saturations were monitored continuously. The                        Colonoscope was introduced through the anus and                        advanced to the the cecum, identified by appendiceal                        orifice and ileocecal valve. The colonoscopy was                        performed without difficulty. The patient tolerated the                        procedure well. The quality of the bowel preparation                        was fair except the ascending colon was poor, multiple                        rinses required throughout. Overall adequate fair prep. Findings:      Multiple small-mouthed diverticula were found in the sigmoid colon and       distal descending colon.      Non-bleeding internal hemorrhoids were found during retroflexion and       during anoscopy. The  hemorrhoids were medium-sized and Grade I (internal       hemorrhoids that do not  prolapse).      The digital rectal exam findings include non-thrombosed internal       hemorrhoids and internal hemorrhoids (Grade I).      The exam was otherwise without abnormality. Impression:           - Preparation of the colon was fair.                       - Diverticulosis in the sigmoid colon and in the distal                        descending colon.                       - Non-bleeding internal hemorrhoids.                       - Non-thrombosed internal hemorrhoids and internal                        hemorrhoids (Grade I) found on digital rectal exam.                       - The examination was otherwise normal.                       - No specimens collected. Recommendation:       - Repeat colonoscopy in 10 years for screening purposes. Procedure Code(s):    --- Professional ---                       [phone], Colonoscopy, flexible; diagnostic, including                        collection of specimen(s) by brushing or washing, when                        performed (separate procedure) Diagnosis Code(s):    --- Professional ---                       Z12.11, Encounter for screening for malignant neoplasm                        of colon                       K64.0, First degree hemorrhoids                       K57.30, Diverticulosis of large intestine without                        perforation or abscess without bleeding CPT copyright 2016 American Medical Association. All rights reserved. The codes documented in this report are preliminary and upon coder review may  be revised to meet current compliance requirements. Lollie Sails, MD 02/15/2016 2:43:48 PM This report has been signed electronically. Number of Addenda: 0 Note Initiated On: 02/15/2016 12:53 PM Scope Withdrawal Time: 0 hours 12 minutes 15 seconds  Total Procedure Duration: 0 hours 28 minutes 5 seconds       [place]  [facility]

## 2016-02-18 ENCOUNTER — Encounter: Payer: Self-pay | Admitting: Gastroenterology

## 2016-02-18 NOTE — Anesthesia Postprocedure Evaluation (Signed)
Anesthesia Post Note  Patient: Willie Freeman  Procedure(s) Performed: Procedure(s) (LRB): COLONOSCOPY WITH PROPOFOL (N/A)  Patient location during evaluation: Endoscopy Anesthesia Type: General Level of consciousness: awake and alert Pain management: pain level controlled Vital Signs Assessment: post-procedure vital signs reviewed and stable Respiratory status: spontaneous breathing, nonlabored ventilation, respiratory function stable and patient connected to nasal cannula oxygen Cardiovascular status: blood pressure returned to baseline and stable Postop Assessment: no signs of nausea or vomiting Anesthetic complications: no    Last Vitals:  Vitals:   02/15/16 1505 02/15/16 1515  BP: (!) 144/84 (!) 166/87  Pulse: (!) 50 (!) 48  Resp: 16 15  Temp:      Last Pain:  Vitals:   02/16/16 1316  TempSrc:   PainSc: 0-No pain                 Martha Clan

## 2016-05-07 DIAGNOSIS — F5101 Primary insomnia: Secondary | ICD-10-CM | POA: Diagnosis not present

## 2016-05-07 DIAGNOSIS — I10 Essential (primary) hypertension: Secondary | ICD-10-CM | POA: Diagnosis not present

## 2016-05-07 DIAGNOSIS — E7800 Pure hypercholesterolemia, unspecified: Secondary | ICD-10-CM | POA: Diagnosis not present

## 2016-05-07 DIAGNOSIS — R7303 Prediabetes: Secondary | ICD-10-CM | POA: Diagnosis not present

## 2016-05-26 DIAGNOSIS — I10 Essential (primary) hypertension: Secondary | ICD-10-CM | POA: Diagnosis not present

## 2016-05-26 DIAGNOSIS — Z953 Presence of xenogenic heart valve: Secondary | ICD-10-CM | POA: Diagnosis not present

## 2016-07-03 DIAGNOSIS — D485 Neoplasm of uncertain behavior of skin: Secondary | ICD-10-CM | POA: Diagnosis not present

## 2016-07-03 DIAGNOSIS — L308 Other specified dermatitis: Secondary | ICD-10-CM | POA: Diagnosis not present

## 2016-07-03 DIAGNOSIS — D225 Melanocytic nevi of trunk: Secondary | ICD-10-CM | POA: Diagnosis not present

## 2016-07-03 DIAGNOSIS — L301 Dyshidrosis [pompholyx]: Secondary | ICD-10-CM | POA: Diagnosis not present

## 2016-07-05 DIAGNOSIS — H2513 Age-related nuclear cataract, bilateral: Secondary | ICD-10-CM | POA: Diagnosis not present

## 2016-08-01 DIAGNOSIS — H2521 Age-related cataract, morgagnian type, right eye: Secondary | ICD-10-CM | POA: Diagnosis not present

## 2016-08-01 DIAGNOSIS — H2520 Age-related cataract, morgagnian type, unspecified eye: Secondary | ICD-10-CM | POA: Diagnosis not present

## 2016-08-01 DIAGNOSIS — H2522 Age-related cataract, morgagnian type, left eye: Secondary | ICD-10-CM | POA: Diagnosis not present

## 2016-09-18 DIAGNOSIS — H25811 Combined forms of age-related cataract, right eye: Secondary | ICD-10-CM | POA: Diagnosis not present

## 2016-09-18 DIAGNOSIS — H2521 Age-related cataract, morgagnian type, right eye: Secondary | ICD-10-CM | POA: Diagnosis not present

## 2016-09-18 DIAGNOSIS — H2181 Floppy iris syndrome: Secondary | ICD-10-CM | POA: Diagnosis not present

## 2016-09-18 DIAGNOSIS — Z862 Personal history of diseases of the blood and blood-forming organs and certain disorders involving the immune mechanism: Secondary | ICD-10-CM | POA: Diagnosis not present

## 2016-09-18 DIAGNOSIS — Z952 Presence of prosthetic heart valve: Secondary | ICD-10-CM | POA: Diagnosis not present

## 2016-09-18 DIAGNOSIS — T446X5S Adverse effect of alpha-adrenoreceptor antagonists, sequela: Secondary | ICD-10-CM | POA: Diagnosis not present

## 2016-09-18 DIAGNOSIS — N400 Enlarged prostate without lower urinary tract symptoms: Secondary | ICD-10-CM | POA: Diagnosis not present

## 2016-09-18 DIAGNOSIS — I10 Essential (primary) hypertension: Secondary | ICD-10-CM | POA: Diagnosis not present

## 2016-09-18 DIAGNOSIS — E785 Hyperlipidemia, unspecified: Secondary | ICD-10-CM | POA: Diagnosis not present

## 2016-09-18 DIAGNOSIS — H52221 Regular astigmatism, right eye: Secondary | ICD-10-CM | POA: Diagnosis not present

## 2016-10-14 ENCOUNTER — Encounter: Payer: Self-pay | Admitting: Emergency Medicine

## 2016-10-14 ENCOUNTER — Emergency Department: Payer: Commercial Managed Care - HMO

## 2016-10-14 ENCOUNTER — Emergency Department
Admission: EM | Admit: 2016-10-14 | Discharge: 2016-10-14 | Disposition: A | Discharge status: Home / Self Care | Payer: Commercial Managed Care - HMO | Attending: Emergency Medicine | Admitting: Emergency Medicine

## 2016-10-14 DIAGNOSIS — Z79899 Other long term (current) drug therapy: Secondary | ICD-10-CM | POA: Insufficient documentation

## 2016-10-14 DIAGNOSIS — Y999 Unspecified external cause status: Secondary | ICD-10-CM | POA: Insufficient documentation

## 2016-10-14 DIAGNOSIS — W228XXA Striking against or struck by other objects, initial encounter: Secondary | ICD-10-CM | POA: Insufficient documentation

## 2016-10-14 DIAGNOSIS — S8012XA Contusion of left lower leg, initial encounter: Secondary | ICD-10-CM | POA: Diagnosis not present

## 2016-10-14 DIAGNOSIS — Y929 Unspecified place or not applicable: Secondary | ICD-10-CM | POA: Diagnosis not present

## 2016-10-14 DIAGNOSIS — S6991XA Unspecified injury of right wrist, hand and finger(s), initial encounter: Secondary | ICD-10-CM | POA: Diagnosis not present

## 2016-10-14 DIAGNOSIS — Y939 Activity, unspecified: Secondary | ICD-10-CM | POA: Diagnosis not present

## 2016-10-14 DIAGNOSIS — L03011 Cellulitis of right finger: Secondary | ICD-10-CM | POA: Insufficient documentation

## 2016-10-14 DIAGNOSIS — I10 Essential (primary) hypertension: Secondary | ICD-10-CM | POA: Diagnosis not present

## 2016-10-14 DIAGNOSIS — S8992XA Unspecified injury of left lower leg, initial encounter: Secondary | ICD-10-CM | POA: Diagnosis present

## 2016-10-14 DIAGNOSIS — M79641 Pain in right hand: Secondary | ICD-10-CM | POA: Diagnosis not present

## 2016-10-14 DIAGNOSIS — Z87891 Personal history of nicotine dependence: Secondary | ICD-10-CM | POA: Diagnosis not present

## 2016-10-14 MED ORDER — TRAMADOL HCL 50 MG PO TABS: 50.0000 mg | ORAL_TABLET | Freq: Two times a day (BID) | ORAL | 0 refills | Status: AC | PRN

## 2016-10-14 MED ORDER — CEPHALEXIN 500 MG PO CAPS: 500.0000 mg | ORAL_CAPSULE | Freq: Four times a day (QID) | ORAL | 0 refills | Status: AC

## 2016-10-14 MED ORDER — TRAMADOL HCL 50 MG PO TABS
50.0000 mg | ORAL_TABLET | Freq: Once | ORAL | Status: AC
  Administered 2016-10-14: 50 mg via ORAL
  Filled 2016-10-14: qty 1

## 2016-10-14 NOTE — ED Notes (Signed)
Swelling and tenderness noted to pts R 3rd finger distal phalanx. Pts sts that he hit digit on windshield wiper while cleaning wife's car.  No bruising noted. Sensation, circulation and motor function intact

## 2016-10-14 NOTE — ED Triage Notes (Signed)
Patient states that he was washing his car on Saturday and hit his right third finger. Patient with complaint of continued pain and swelling to his finger. Patient states that he hit his left lower leg last Monday and continues to have a small area of redness. Patient reports that it has improved some but would like to have it checked it out.

## 2016-10-14 NOTE — ED Provider Notes (Signed)
[facility] Emergency Department Provider Note  ____________________________________________  Time seen: Approximately 10:56 PM  I have reviewed the triage vital signs and the nursing notes.   HISTORY  Chief Complaint Finger Injury and Wound Check    HPI Willie Freeman is a 71 y.o. male presenting to the emergency department with a paronychia of the right middle finger. Patient states that he has noticed symptoms for the past 3 days. He denies fever or chills. Patient states that he bites his fingernails. Patient rates right middle finger pain at 7 out of 10 in intensity and describes it as throbbing. Patient states that he also has a 1 cm x 1 cm region of focal edema along the left lower leg after accidentally hitting his leg approximately one week ago. Patient denies weakness or radiculopathy. Patient denies chest pain, chest tightness, shortness of breath, nausea, vomiting and abdominal pain. No alleviating measures have been attempted.   Past Medical History:  Diagnosis Date  . Back pain   . BPH (benign prostatic hypertrophy)   . Dysrhythmia   . Elevated PSA   . Heart murmur   . Hydrocele   . Hypertension   . Myocardial infarction ([place])   . Nocturia   . Stroke ([place])    ministroke  . Urinary frequency     Patient Active Problem List   Diagnosis Date Noted  . BPH with obstruction/lower urinary tract symptoms 05/21/2015  . History of elevated PSA 05/21/2015  . S/P AVR 11/15/2013  . TIA (transient ischemic attack) 11/02/2013    Past Surgical History:  Procedure Laterality Date  . AORTIC VALVE REPLACEMENT N/A 11/15/2013   Procedure: AORTIC VALVE REPLACEMENT (AVR);  Surgeon: Gaye Pollack, MD;  Location: [place];  Service: Open Heart Surgery;  Laterality: N/A;  . COLONOSCOPY WITH PROPOFOL N/A 02/15/2016   Procedure: COLONOSCOPY WITH PROPOFOL;  Surgeon: Lollie Sails, MD;  Location: [facility] ENDOSCOPY;  Service: Endoscopy;  Laterality: N/A;  . [place]   bilateral - inguinal   . INTRAOPERATIVE TRANSESOPHAGEAL ECHOCARDIOGRAM N/A 11/15/2013   Procedure: INTRAOPERATIVE TRANSESOPHAGEAL ECHOCARDIOGRAM;  Surgeon: Gaye Pollack, MD;  Location: [place] OR;  Service: Open Heart Surgery;  Laterality: N/A;  . LEFT HEART CATHETERIZATION WITH CORONARY ANGIOGRAM N/A 11/02/2013   Procedure: LEFT HEART CATHETERIZATION WITH CORONARY ANGIOGRAM;  Surgeon: Laverda Page, MD;  Location: [facility] CATH LAB;  Service: Cardiovascular;  Laterality: N/A;    Prior to Admission medications   Medication Sig Start Date End Date Taking? Authorizing Provider  aspirin EC 325 MG EC tablet Take 1 tablet (325 mg total) by mouth daily. 11/04/13   Adrian Prows, MD  atorvastatin (LIPITOR) 10 MG tablet Take 10 mg by mouth daily.    Historical Provider, MD  cephALEXin (KEFLEX) 500 MG capsule Take 1 capsule (500 mg total) by mouth 4 (four) times daily. 10/14/16 10/24/16  Lannie Fields, PA-C  clobetasol cream (TEMOVATE) 4.12 % Apply 1 application topically 2 (two) times daily. prn    Historical Provider, MD  finasteride (PROSCAR) 5 MG tablet Take 1 tablet (5 mg total) by mouth daily. 11/22/15   Larene Beach A McGowan, PA-C  lisinopril (PRINIVIL,ZESTRIL) 10 MG tablet Take 10 mg by mouth daily. Reported on 11/22/2015    Historical Provider, MD  metoprolol tartrate (LOPRESSOR) 25 MG tablet Take 12.5 mg by mouth 2 (two) times daily. Reported on 11/22/2015 11/19/13   Nani Skillern, PA-C  tamsulosin (FLOMAX) 0.4 MG CAPS capsule Take 1 capsule (  0.4 mg total) by mouth daily. 11/22/15   Nori Riis, PA-C  tolterodine (DETROL LA) 2 MG 24 hr capsule Take 1 capsule (2 mg total) by mouth daily. 11/22/15   Nori Riis, PA-C  traMADol (ULTRAM) 50 MG tablet Take 1 tablet (50 mg total) by mouth every 12 (twelve) hours as needed. 10/14/16 10/17/16  Lannie Fields, PA-C  traZODone (DESYREL) 50 MG tablet Take by mouth. 05/01/15   Historical Provider, MD  triamcinolone cream (KENALOG) 0.1 % Apply topically.     Historical Provider, MD  valsartan (DIOVAN) 80 MG tablet Take by mouth. Reported on 11/22/2015    Historical Provider, MD  valsartan-hydrochlorothiazide (DIOVAN-HCT) 160-12.5 MG tablet Reported on 11/22/2015 08/20/15   Historical Provider, MD  Vitamins A & D (VITAMIN A & D) 10000-400 units TABS Reported on 11/22/2015 08/20/15   Historical Provider, MD    Allergies Patient has no known allergies.  Family History  Problem Relation Age of Onset  . Prostate cancer Father   . Heart attack Mother   . Hypertension Brother   . Coronary artery disease Brother   . Kidney disease Neg Hx     Social History Social History  Substance Use Topics  . Smoking status: Former Smoker    Types: Cigars, Pipe  . Smokeless tobacco: Former Systems developer    Quit date: 11/11/1973  . Alcohol use Yes     Comment: Rarely- beer, glass of wine     Review of Systems  Constitutional: No fever/chills Eyes: No visual changes. No discharge ENT: No upper respiratory complaints. Cardiovascular: no chest pain. Respiratory: no cough. No SOB. Gastrointestinal: No abdominal pain.  No nausea, no vomiting.  No diarrhea.  No constipation. Musculoskeletal: Negative for musculoskeletal pain. Skin: Patient has focal edema of the left lower leg and a paronychia of the right middle finger. Neurological: Negative for headaches, focal weakness or numbness.  ____________________________________________   PHYSICAL EXAM:  VITAL SIGNS: ED Triage Vitals  Enc Vitals Group     BP 10/14/16 1931 (!) 155/85     Pulse Rate 10/14/16 1931 65     Resp 10/14/16 1931 20     Temp 10/14/16 1931 99.2 F (37.3 C)     Temp Source 10/14/16 1931 Oral     SpO2 10/14/16 1931 96 %     Weight 10/14/16 1931 170 lb (77.1 kg)     Height 10/14/16 1931 5\' 11"  (1.803 m)     Head Circumference --      Peak Flow --      Pain Score 10/14/16 1935 8     Pain Loc --      Pain Edu? --      Excl. in [place]? --      Constitutional: Alert and oriented. Well  appearing and in no acute distress. Eyes: Conjunctivae are normal. PERRL. EOMI. Head: Atraumatic.  Cardiovascular: Normal rate, regular rhythm. Normal S1 and S2.  Good peripheral circulation. Respiratory: Normal respiratory effort without tachypnea or retractions. Lungs CTAB. Good air entry to the bases with no decreased or absent breath sounds. Gastrointestinal: Bowel sounds 4 quadrants. Soft and nontender to palpation. No guarding or rigidity. No palpable masses. No distention. No CVA tenderness. Musculoskeletal: Full range of motion to all extremities. No gross deformities appreciated. Neurologic:  Normal speech and language. No gross focal neurologic deficits are appreciated.  Skin:  Patient has a paronychia of the right middle finger and a 1 cm x 1 cm region of focal edema at  the left lower leg. Psychiatric: Mood and affect are normal. Speech and behavior are normal. Patient exhibits appropriate insight and judgement.   ____________________________________________   LABS (all labs ordered are listed, but only abnormal results are displayed)  Labs Reviewed - No data to display ____________________________________________  EKG   ____________________________________________  RADIOLOGY Unk Pinto, personally viewed and evaluated these images (plain radiographs) as part of my medical decision making, as well as reviewing the written report by the radiologist.  Dg Finger Middle Right  Result Date: 10/14/2016 CLINICAL DATA:  Hit right third finger while washing car, with pain and swelling. Initial encounter. EXAM: RIGHT MIDDLE FINGER 2+V COMPARISON:  None. FINDINGS: There is no evidence of fracture or dislocation. Mild soft tissue swelling is noted about the third finger. Visualized joint spaces are preserved. IMPRESSION: No evidence of fracture or dislocation. Electronically Signed   By: Garald Balding M.D.   On: 10/14/2016 20:07     ____________________________________________    PROCEDURES  Procedure(s) performed:    Procedures    Medications  traMADol (ULTRAM) tablet 50 mg (50 mg Oral Given 10/14/16 2117)     ____________________________________________   INITIAL IMPRESSION / ASSESSMENT AND PLAN / ED COURSE  Pertinent labs & imaging results that were available during my care of the patient were reviewed by me and considered in my medical decision making (see chart for details).  Review of the [place] CSRS was performed in accordance of the [place] prior to dispensing any controlled drugs.     Assessment and plan: Paronychia Contusion of left lower leg Patient presents to the emergency department with a paronychia of the right middle finger and a 1 cm x 1 cm region of focal edema of the left lower leg consistent with contusion. On physical exam, paronychia was not susceptible to drainage. Patient was discharged with Keflex. Due to severity of pain, patient was also discharged with a short course of tramadol. Patient was advised to soak right middle finger in warm soapy water 3 times daily. Patient voiced understanding. Patient was advised to follow-up with his PCP as needed. All patient questions were answered. ____________________________________________  FINAL CLINICAL IMPRESSION(S) / ED DIAGNOSES  Final diagnoses:  Paronychia, finger, right      NEW MEDICATIONS STARTED DURING THIS VISIT:  Discharge Medication List as of 10/14/2016  9:13 PM    START taking these medications   Details  cephALEXin (KEFLEX) 500 MG capsule Take 1 capsule (500 mg total) by mouth 4 (four) times daily., Starting Tue 10/14/2016, Until Fri 10/24/2016, Print            This chart was dictated using voice recognition software/Dragon. Despite best efforts to proofread, errors can occur which can change the meaning. Any change was purely unintentional.    Lannie Fields, PA-C 10/14/16 2307    Harvest Dark,  MD 10/14/16 [phone]

## 2016-10-16 DIAGNOSIS — H578 Other specified disorders of eye and adnexa: Secondary | ICD-10-CM | POA: Diagnosis not present

## 2016-10-16 DIAGNOSIS — Z9841 Cataract extraction status, right eye: Secondary | ICD-10-CM | POA: Diagnosis not present

## 2016-10-16 DIAGNOSIS — D649 Anemia, unspecified: Secondary | ICD-10-CM | POA: Diagnosis not present

## 2016-10-16 DIAGNOSIS — H2512 Age-related nuclear cataract, left eye: Secondary | ICD-10-CM | POA: Diagnosis not present

## 2016-10-16 DIAGNOSIS — E785 Hyperlipidemia, unspecified: Secondary | ICD-10-CM | POA: Diagnosis not present

## 2016-10-16 DIAGNOSIS — I10 Essential (primary) hypertension: Secondary | ICD-10-CM | POA: Diagnosis not present

## 2016-10-16 DIAGNOSIS — Z952 Presence of prosthetic heart valve: Secondary | ICD-10-CM | POA: Diagnosis not present

## 2016-10-16 DIAGNOSIS — D696 Thrombocytopenia, unspecified: Secondary | ICD-10-CM | POA: Diagnosis not present

## 2016-10-16 DIAGNOSIS — N400 Enlarged prostate without lower urinary tract symptoms: Secondary | ICD-10-CM | POA: Diagnosis not present

## 2016-10-16 DIAGNOSIS — Z961 Presence of intraocular lens: Secondary | ICD-10-CM | POA: Diagnosis not present

## 2016-10-16 DIAGNOSIS — R7309 Other abnormal glucose: Secondary | ICD-10-CM | POA: Diagnosis not present

## 2016-10-20 ENCOUNTER — Other Ambulatory Visit: Payer: Self-pay | Admitting: Urology

## 2016-11-03 ENCOUNTER — Other Ambulatory Visit: Payer: Self-pay | Admitting: Urology

## 2016-11-13 DIAGNOSIS — I10 Essential (primary) hypertension: Secondary | ICD-10-CM | POA: Diagnosis not present

## 2016-11-13 DIAGNOSIS — E7800 Pure hypercholesterolemia, unspecified: Secondary | ICD-10-CM | POA: Diagnosis not present

## 2016-11-13 DIAGNOSIS — R7303 Prediabetes: Secondary | ICD-10-CM | POA: Diagnosis not present

## 2016-11-13 DIAGNOSIS — Z0000 Encounter for general adult medical examination without abnormal findings: Secondary | ICD-10-CM | POA: Diagnosis not present

## 2016-11-14 ENCOUNTER — Other Ambulatory Visit: Payer: Commercial Managed Care - HMO

## 2016-11-20 ENCOUNTER — Ambulatory Visit: Payer: Commercial Managed Care - HMO | Admitting: Urology

## 2016-11-20 ENCOUNTER — Other Ambulatory Visit: Payer: Self-pay

## 2016-11-20 DIAGNOSIS — Z0000 Encounter for general adult medical examination without abnormal findings: Secondary | ICD-10-CM | POA: Diagnosis not present

## 2016-11-20 DIAGNOSIS — I10 Essential (primary) hypertension: Secondary | ICD-10-CM | POA: Diagnosis not present

## 2016-11-20 DIAGNOSIS — Z862 Personal history of diseases of the blood and blood-forming organs and certain disorders involving the immune mechanism: Secondary | ICD-10-CM | POA: Diagnosis not present

## 2016-11-20 DIAGNOSIS — E7800 Pure hypercholesterolemia, unspecified: Secondary | ICD-10-CM | POA: Diagnosis not present

## 2016-11-20 DIAGNOSIS — Z87898 Personal history of other specified conditions: Secondary | ICD-10-CM

## 2016-11-20 DIAGNOSIS — R7303 Prediabetes: Secondary | ICD-10-CM | POA: Diagnosis not present

## 2016-11-21 ENCOUNTER — Ambulatory Visit: Payer: Commercial Managed Care - HMO | Admitting: Urology

## 2016-11-27 ENCOUNTER — Other Ambulatory Visit: Payer: Commercial Managed Care - HMO

## 2016-11-27 DIAGNOSIS — Z87898 Personal history of other specified conditions: Secondary | ICD-10-CM | POA: Diagnosis not present

## 2016-11-27 DIAGNOSIS — N138 Other obstructive and reflux uropathy: Secondary | ICD-10-CM | POA: Diagnosis not present

## 2016-11-27 DIAGNOSIS — Z8042 Family history of malignant neoplasm of prostate: Secondary | ICD-10-CM | POA: Diagnosis not present

## 2016-11-27 DIAGNOSIS — N401 Enlarged prostate with lower urinary tract symptoms: Secondary | ICD-10-CM | POA: Diagnosis not present

## 2016-11-28 LAB — PSA: Prostate Specific Ag, Serum: 1.5 ng/mL (ref 0.0–4.0)

## 2016-12-15 NOTE — Progress Notes (Signed)
9:03 AM   Willie Freeman 1945-10-06 [id number]  Referring provider: Dion Body, MD [facility] [place], [place] [address]  Chief Complaint  Patient presents with  . Benign Prostatic Hypertrophy    1 year follow up  . Elevated PSA    HPI: Patient is 71 year old Caucasian male with history of elevated PSA, family history of prostate cancer and BPH with LUTS who presents today for a 6 month follow-up.  History of elevated PSA Patient had an elevated PSA of 5.5 ng/mL on 01/20/2012.   He was not wanting to undergo a biopsy, so finasteride was started.  The PSA has reduced in value incrementally since starting the finasteride with his last PSA being 1.5 ng/mL on 11/27/2016.    BPH WITH LUTS His IPSS score today is 2, which is mild lower urinary tract symptomatology. He is delighted with his quality life due to his urinary symptoms.  His previous I PSS score was 2/2.  His main complaints today are frequency.  He has had them for several years.  He denies any dysuria, hematuria or suprapubic pain.   He currently taking tamsulosin 0.4 mg daily, finasteride 5 mg daily and tolterodine LA 2 mg daily.   He also denies any recent fevers, chills, nausea or vomiting.         IPSS    Row Name 12/16/16 0800         International Prostate Symptom Score   How often have you had the sensation of not emptying your bladder? Not at All     How often have you had to urinate less than every two hours? Less than 1 in 5 times     How often have you found you stopped and started again several times when you urinated? Not at All     How often have you found it difficult to postpone urination? Not at All     How often have you had a weak urinary stream? Not at All     How often have you had to strain to start urination? Not at All     How many times did you typically get up at night to urinate? 1 Time     Total IPSS Score 2       Quality of Life due to urinary  symptoms   If you were to spend the rest of your life with your urinary condition just the way it is now how would you feel about that? Delighted        Score:  1-7 Mild 8-19 Moderate 20-35 Severe   Family history of prostate cancer He has a family history of PCa, with his father having lethal prostate cancer.   Patient would like to continue PSA screening.   PMH: Past Medical History:  Diagnosis Date  . Back pain   . BPH (benign prostatic hypertrophy)   . Dysrhythmia   . Elevated PSA   . Heart murmur   . Hydrocele   . Hypertension   . Myocardial infarction ([place])   . Nocturia   . Stroke ([place])    ministroke  . Urinary frequency     Surgical History: Past Surgical History:  Procedure Laterality Date  . 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 COLONOSCOPY WITH PROPOFOL N/A 02/15/2016   Procedure: COLONOSCOPY WITH  PROPOFOL;  Surgeon: Lollie Sails, MD;  Location: [facility] ENDOSCOPY;  Service: Endoscopy;  Laterality: N/A;  . [place]   bilateral - inguinal   . INTRAOPERATIVE TRANSESOPHAGEAL ECHOCARDIOGRAM N/A 11/15/2013   Procedure: INTRAOPERATIVE TRANSESOPHAGEAL ECHOCARDIOGRAM;  Surgeon: Gaye Pollack, MD;  Location: [place] [facility] OR;  Service: Open Heart Surgery;  Laterality: N/A;  . LEFT HEART CATHETERIZATION WITH CORONARY ANGIOGRAM N/A 11/02/2013   Procedure: LEFT HEART CATHETERIZATION WITH CORONARY ANGIOGRAM;  Surgeon: Laverda Page, MD;  Location: [facility] CATH LAB;  Service: Cardiovascular;  Laterality: N/A;    Home Medications:  Allergies as of 12/16/2016   No Known Allergies     Medication List       Accurate as of 12/16/16  9:03 AM. Always use your most recent med list.          aspirin 81 MG chewable tablet Chew 81 mg by mouth 2 (two) times daily.   aspirin 325 MG EC tablet Take 1 tablet (325 mg total) by mouth  daily.   atorvastatin 10 MG tablet Commonly known as:  LIPITOR Take 10 mg by mouth daily.   clobetasol cream 0.05 % Commonly known as:  TEMOVATE Apply 1 application topically 2 (two) times daily. prn   finasteride 5 MG tablet Commonly known as:  PROSCAR Take 1 tablet (5 mg total) by mouth daily.   ketorolac 0.5 % ophthalmic solution Commonly known as:  ACULAR 1 drop three times a day in the surgical eye x 4 weeks. Start after surgery   lisinopril 10 MG tablet Commonly known as:  PRINIVIL,ZESTRIL Take 10 mg by mouth daily. Reported on 11/22/2015   metoprolol tartrate 25 MG tablet Commonly known as:  LOPRESSOR Take 12.5 mg by mouth 2 (two) times daily. Reported on 11/22/2015   ofloxacin 0.3 % ophthalmic solution Commonly known as:  OCUFLOX 1 drop in the surgical eye three times a day for 1 week. Start after surgery   prednisoLONE acetate 1 % ophthalmic suspension Commonly known as:  PRED FORTE 1 drop in the surgical eye three times a day for 2 weeks. Start after surgery.   tamsulosin 0.4 MG Caps capsule Commonly known as:  FLOMAX Take 1 capsule (0.4 mg total) by mouth daily.   tolterodine 2 MG 24 hr capsule Commonly known as:  DETROL LA Take 1 capsule (2 mg total) by mouth daily.   traMADol 50 MG tablet Commonly known as:  ULTRAM TAKE ONE TABLET BY MOUTH EVERY 6 HOURS AS NEEDED FOR MODERATE PAIN   traZODone 50 MG tablet Commonly known as:  DESYREL Take by mouth.   triamcinolone cream 0.1 % Commonly known as:  KENALOG Apply topically.   valsartan 80 MG tablet Commonly known as:  DIOVAN Take by mouth. Reported on 11/22/2015   valsartan-hydrochlorothiazide 160-12.5 MG tablet Commonly known as:  DIOVAN-HCT Reported on 11/22/2015   Vitamin A & D 10000-400 units Tabs Reported on 11/22/2015       Allergies: No Known Allergies  Family History: Family History  Problem Relation Age of Onset  . Prostate cancer Father   . Heart attack Mother   . Hypertension Brother    . Coronary artery disease Brother   . Kidney disease Neg Hx   . Kidney cancer Neg Hx   . Bladder Cancer Neg Hx     Social History:  reports that he has quit smoking. His smoking use included Cigars and Pipe. He quit smokeless tobacco use about 43 years ago. He reports that  he drinks alcohol. He reports that he does not use drugs.  ROS: UROLOGY Frequent Urination?: No Hard to postpone urination?: No Burning/pain with urination?: No Get up at night to urinate?: No Leakage of urine?: No Urine stream starts and stops?: No Trouble starting stream?: No Do you have to strain to urinate?: No Blood in urine?: No Urinary tract infection?: No Sexually transmitted disease?: No Injury to kidneys or bladder?: No Painful intercourse?: No Weak stream?: No Erection problems?: No Penile pain?: No  Gastrointestinal Nausea?: No Vomiting?: No Indigestion/heartburn?: No Diarrhea?: No Constipation?: No  Constitutional Fever: No Night sweats?: No Weight loss?: No Fatigue?: No  Skin Skin rash/lesions?: No Itching?: Yes  Eyes Blurred vision?: No Double vision?: No  Ears/Nose/Throat Sore throat?: No Sinus problems?: No  Hematologic/Lymphatic Swollen glands?: No Easy bruising?: No  Cardiovascular Leg swelling?: No Chest pain?: No  Respiratory Cough?: No Shortness of breath?: No  Endocrine Excessive thirst?: No  Musculoskeletal Back pain?: No Joint pain?: No  Neurological Headaches?: No Dizziness?: No  Psychologic Depression?: No Anxiety?: No  Physical Exam: BP (!) 149/87   Pulse 69   Ht 5\' 11"  (1.803 m)   Wt 169 lb 6.4 oz (76.8 kg)   BMI 23.63 kg/m   GU: No CVA tenderness.  No bladder fullness or masses.  Patient with circumcised phallus.  Urethral meatus is patent.  No penile discharge. No penile lesions or rashes. Scrotum without lesions, cysts, rashes and/or edema.  Right hydrocele is noted.  Left testicles is located scrotally bilaterally. Could not  palpate the right testicle due to the hydrocele.  No masses are appreciated in the testicles. Left and right epididymis are normal. Rectal: Patient with  normal sphincter tone. Anus and perineum without scarring or rashes. No rectal masses are appreciated. Prostate is approximately 60 grams, no nodules are appreciated. Seminal vesicles are normal.   Laboratory Data: Lab Results  Component Value Date   WBC 4.7 02/15/2016   HGB 12.4 (L) 02/15/2016   HCT 36.5 (L) 02/15/2016   MCV 89.4 02/15/2016   PLT 128 (L) 02/15/2016    Lab Results  Component Value Date   CREATININE 1.04 10/29/2014    PSA history:  5.5 ng/mL on 01/20/2012  3.0 ng/mL on 03/04/2012  1.6 ng/mL on 09/06/2012  1.8 ng/mL on 03/28/2013  2.2 ng/mL on 04/17/2014  1.2 ng/mL on 10/17/2014  1.1 ng/mL on 05/21/2015  1.2 ng/mL on 11/13/2015  1.5 ng/mL on 11/27/2016  Assessment & Plan:    1. BPH (benign prostatic hyperplasia) with LUTS:    I PSS score is 2/0. He will continue the tamsulosin, finasteride and tolterodine.  Refills for these medications are given at this time. He will return in 12 months for I PSS score, exam and PSA.  2. History of elevated PSA:   Patient has a history of elevated PSA.  PSA's have trended low for the last few years.  We will see him annually.    3. Family history of prostate cancer  - Patient has a history of elevated PSA and a family history of lethal prostate cancer his father  - PSA's have trended low for the last few years  - We will see him annually for PSA's and exams    Return in about 1 year (around 12/16/2017) for IPSS, PSA and exam.  Zara Council, [facility]  [facility] [address], [place], [place] [address] [phone]

## 2016-12-16 ENCOUNTER — Encounter: Payer: Self-pay | Admitting: Urology

## 2016-12-16 ENCOUNTER — Ambulatory Visit (INDEPENDENT_AMBULATORY_CARE_PROVIDER_SITE_OTHER): Payer: Medicare HMO | Admitting: Urology

## 2016-12-16 VITALS — BP 149/87 | HR 69 | Ht 71.0 in | Wt 169.4 lb

## 2016-12-16 DIAGNOSIS — Z87898 Personal history of other specified conditions: Secondary | ICD-10-CM | POA: Diagnosis not present

## 2016-12-16 DIAGNOSIS — Z8042 Family history of malignant neoplasm of prostate: Secondary | ICD-10-CM | POA: Diagnosis not present

## 2016-12-16 DIAGNOSIS — N401 Enlarged prostate with lower urinary tract symptoms: Secondary | ICD-10-CM

## 2016-12-16 DIAGNOSIS — N138 Other obstructive and reflux uropathy: Secondary | ICD-10-CM | POA: Diagnosis not present

## 2016-12-16 MED ORDER — FINASTERIDE 5 MG PO TABS: 5.0000 mg | ORAL_TABLET | Freq: Every day | ORAL | 3 refills | Status: DC

## 2016-12-16 MED ORDER — TAMSULOSIN HCL 0.4 MG PO CAPS: 0.4000 mg | ORAL_CAPSULE | Freq: Every day | ORAL | 4 refills | Status: DC

## 2016-12-16 MED ORDER — TOLTERODINE TARTRATE ER 2 MG PO CP24: 2.0000 mg | ORAL_CAPSULE | Freq: Every day | ORAL | 3 refills | Status: DC

## 2017-01-16 ENCOUNTER — Telehealth: Payer: Self-pay

## 2017-01-16 NOTE — Telephone Encounter (Signed)
Spoke with patient and he fully understands.

## 2017-01-16 NOTE — Telephone Encounter (Signed)
Patient requested our office contact his pharmacy in reference to receiving information from them about valsartan-hctz medication recall. Advised patient medication not managed by Shannon-PA in urology, but by different office, advised to contact PCP. Patient insisted I have Larene Beach to call him b/c he would like to have this medication changed. Spent several minutes explaining how Shannon, PA-C does not manage this medication for him and recommend he contacts PCP. Patient refused, became very upset, and asked that I have Shannon,PA-C to call him.

## 2017-01-16 NOTE — Telephone Encounter (Signed)
Please tell the patient that I will speak with him on Monday.  Tell him that you have made me aware of the situation and he needs to speak to his PCP in the meantime.

## 2017-01-27 ENCOUNTER — Telehealth: Payer: Self-pay | Admitting: Urology

## 2017-01-27 DIAGNOSIS — N401 Enlarged prostate with lower urinary tract symptoms: Secondary | ICD-10-CM

## 2017-01-27 MED ORDER — OXYBUTYNIN CHLORIDE 5 MG PO TABS: 5.0000 mg | ORAL_TABLET | Freq: Three times a day (TID) | ORAL | 3 refills | Status: DC

## 2017-01-27 NOTE — Telephone Encounter (Signed)
Patient came into the office today with a letter from [facility].  He is requesting that we change the Tolterodine (Detrol) prescription to Oxybutynin IR TAB and submit to the [facility] for a $0 copay.  Please advise the patient if this is okay and that it has been complete.

## 2017-01-27 NOTE — Telephone Encounter (Signed)
Oxybutynin 5mg  tid was sent to [facility].

## 2017-01-27 NOTE — Telephone Encounter (Signed)
Please advise 

## 2017-02-05 ENCOUNTER — Telehealth: Payer: Self-pay

## 2017-02-05 NOTE — Telephone Encounter (Signed)
Pt walked in requesting med list be updated with [employer]. Spoke with [employer] and toleteradine was removed and oxybutynin kept.

## 2017-02-10 ENCOUNTER — Ambulatory Visit: Payer: Medicare HMO | Admitting: Urology

## 2017-02-10 ENCOUNTER — Encounter: Payer: Self-pay | Admitting: Urology

## 2017-02-10 VITALS — BP 147/73 | HR 65 | Ht 71.0 in | Wt 170.0 lb

## 2017-02-10 DIAGNOSIS — N430 Encysted hydrocele: Secondary | ICD-10-CM

## 2017-02-10 DIAGNOSIS — E7800 Pure hypercholesterolemia, unspecified: Secondary | ICD-10-CM | POA: Insufficient documentation

## 2017-02-10 NOTE — Progress Notes (Signed)
02/10/2017 8:28 AM   Willie Freeman 11-05-1945 [id number]  Referring provider: Dion Body, MD [facility] [place], [place] [address]  Scrotal swelling   HPI: Please see PA McGowan's 12/16/2016 note for patient's GU hx. He was recently seen and f/u today to consider right hydrocele management and specifically aspiration. He's had a right hydrocele for a # of years. He had it aspirated a few years ago and it has slowly enlarged. It is bothersome and uncomfortable. It has gotten larger over the years and pushes on his thigh. Looking back it was probably larger when it was aspirated than it is now. No recent imaging. He has had an AVR and is on ASA 81 mg BID.   Modifying factors: There are no other modifying factors  Associated signs and symptoms: There are no other associated signs and symptoms Aggravating and relieving factors: There are no other aggravating or relieving factors Severity: Moderate Duration: Persistent    PMH: Past Medical History:  Diagnosis Date  . Back pain   . BPH (benign prostatic hypertrophy)   . Dysrhythmia   . Elevated PSA   . Heart murmur   . Hydrocele   . Hypertension   . Myocardial infarction ([place])   . Nocturia   . Stroke ([place])    ministroke  . Urinary frequency     Surgical History: Past Surgical History:  Procedure Laterality Date  . 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 COLONOSCOPY WITH PROPOFOL N/A 02/15/2016   Procedure: COLONOSCOPY WITH PROPOFOL;  Surgeon: Lollie Sails, MD;  Location: [facility] ENDOSCOPY;  Service: Endoscopy;  Laterality: N/A;  . [place]   bilateral - inguinal   . INTRAOPERATIVE TRANSESOPHAGEAL ECHOCARDIOGRAM N/A 11/15/2013   Procedure: INTRAOPERATIVE TRANSESOPHAGEAL ECHOCARDIOGRAM;  Surgeon: Gaye Pollack, MD;  Location: [facility] OR;   Service: Open Heart Surgery;  Laterality: N/A;  . LEFT HEART CATHETERIZATION WITH CORONARY ANGIOGRAM N/A 11/02/2013   Procedure: LEFT HEART CATHETERIZATION WITH CORONARY ANGIOGRAM;  Surgeon: Laverda Page, MD;  Location: [facility] CATH LAB;  Service: Cardiovascular;  Laterality: N/A;    Home Medications:  Allergies as of 02/10/2017   No Known Allergies     Medication List       Accurate as of 02/10/17  8:28 AM. Always use your most recent med list.          aspirin 81 MG chewable tablet Chew 81 mg by mouth 2 (two) times daily.   aspirin 325 MG EC tablet Take 1 tablet (325 mg total) by mouth daily.   atorvastatin 10 MG tablet Commonly known as:  LIPITOR Take 10 mg by mouth daily.   clobetasol cream 0.05 % Commonly known as:  TEMOVATE Apply 1 application topically 2 (two) times daily. prn   finasteride 5 MG tablet Commonly known as:  PROSCAR Take 1 tablet (5 mg total) by mouth daily.   ketorolac 0.5 % ophthalmic solution Commonly known as:  ACULAR 1 drop three times a day in the surgical eye x 4 weeks. Start after surgery   lisinopril 10 MG tablet Commonly known as:  PRINIVIL,ZESTRIL Take 10 mg by mouth daily. Reported on 11/22/2015   metoprolol tartrate 25 MG tablet Commonly known as:  LOPRESSOR Take 12.5 mg by mouth 2 (two) times daily. Reported on 11/22/2015   ofloxacin 0.3 %  ophthalmic solution Commonly known as:  OCUFLOX 1 drop in the surgical eye three times a day for 1 week. Start after surgery   oxybutynin 5 MG tablet Commonly known as:  DITROPAN Take 1 tablet (5 mg total) by mouth 3 (three) times daily.   prednisoLONE acetate 1 % ophthalmic suspension Commonly known as:  PRED FORTE 1 drop in the surgical eye three times a day for 2 weeks. Start after surgery.   tamsulosin 0.4 MG Caps capsule Commonly known as:  FLOMAX Take 1 capsule (0.4 mg total) by mouth daily.   tolterodine 2 MG 24 hr capsule Commonly known as:  DETROL LA Take 1 capsule (2 mg total)  by mouth daily.   traMADol 50 MG tablet Commonly known as:  ULTRAM TAKE ONE TABLET BY MOUTH EVERY 6 HOURS AS NEEDED FOR MODERATE PAIN   traZODone 50 MG tablet Commonly known as:  DESYREL Take by mouth.   triamcinolone cream 0.1 % Commonly known as:  KENALOG Apply topically.   valsartan 80 MG tablet Commonly known as:  DIOVAN Take by mouth. Reported on 11/22/2015   valsartan-hydrochlorothiazide 160-12.5 MG tablet Commonly known as:  DIOVAN-HCT Reported on 11/22/2015   Vitamin A & D 10000-400 units Tabs Reported on 11/22/2015       Allergies: No Known Allergies  Family History: Family History  Problem Relation Age of Onset  . Prostate cancer Father   . Heart attack Mother   . Hypertension Brother   . Coronary artery disease Brother   . Kidney disease Neg Hx   . Kidney cancer Neg Hx   . Bladder Cancer Neg Hx     Social History:  reports that he has quit smoking. His smoking use included Cigars and Pipe. He quit smokeless tobacco use about 43 years ago. He reports that he drinks alcohol. He reports that he does not use drugs.  ROS:                                        Physical Exam: There were no vitals taken for this visit.  Constitutional:  Alert and oriented, No acute distress. HEENT: [place] AT, moist mucus membranes.  Trachea midline, no masses. Cardiovascular: No clubbing, cyanosis, or edema. Respiratory: Normal respiratory effort, no increased work of breathing. GI: Abdomen is soft, nontender, nondistended, no abdominal masses GU: No CVA tenderness.  Penis circumcised and normal; Right hydrocele - small - medium size. Right testicle partially palpable dependently and normal.   Skin: No rashes, bruises or suspicious lesions. Lymph: No cervical or inguinal adenopathy. Neurologic: Grossly intact, no focal deficits, moving all 4 extremities. Psychiatric: Normal mood and affect.  Laboratory Data: Lab Results  Component Value Date   WBC 4.7  02/15/2016   HGB 12.4 (L) 02/15/2016   HCT 36.5 (L) 02/15/2016   MCV 89.4 02/15/2016   PLT 128 (L) 02/15/2016    Lab Results  Component Value Date   CREATININE 1.04 10/29/2014    No results found for: PSA  No results found for: TESTOSTERONE  Lab Results  Component Value Date   HGBA1C 5.5 11/11/2013    Urinalysis    Component Value Date/Time   COLORURINE YELLOW (A) 10/29/2014 1742   APPEARANCEUR CLEAR (A) 10/29/2014 1742   LABSPEC 1.016 10/29/2014 1742   PHURINE 5.0 10/29/2014 1742   GLUCOSEU NEGATIVE 10/29/2014 1742   HGBUR 2+ (A) 10/29/2014 1742  BILIRUBINUR NEGATIVE 10/29/2014 1742   KETONESUR NEGATIVE 10/29/2014 1742   PROTEINUR NEGATIVE 10/29/2014 1742   UROBILINOGEN 0.2 11/11/2013 1015   NITRITE NEGATIVE 10/29/2014 1742   LEUKOCYTESUR NEGATIVE 10/29/2014 1742    Pertinent Imaging:  Assessment & Plan:    Right hydrocele - in terms of size it's on the smaller end of hydroceles and likely just getting to the point of being bothersome. With it's smaller size, no recent imaging and ASA use I'm hesitant to aspirate it today. We discussed nature, r,b of aspiration, hydrocelectomy and surveillance. He'll consider. We'll get a scrotal US next available and have him return in 6 mo for a repeat exam.   There are no diagnoses linked to this encounter.  No Follow-up on file.  Festus Aloe, [facility] [address], [place], [place] [address] [phone]

## 2017-02-24 ENCOUNTER — Ambulatory Visit
Admission: RE | Admit: 2017-02-24 | Discharge: 2017-02-24 | Disposition: A | Discharge status: Home / Self Care | Payer: Medicare HMO | Source: Ambulatory Visit | Attending: Urology | Admitting: Urology

## 2017-02-24 DIAGNOSIS — N503 Cyst of epididymis: Secondary | ICD-10-CM | POA: Diagnosis not present

## 2017-02-24 DIAGNOSIS — N430 Encysted hydrocele: Secondary | ICD-10-CM | POA: Diagnosis not present

## 2017-03-05 ENCOUNTER — Telehealth: Payer: Self-pay

## 2017-03-05 NOTE — Telephone Encounter (Signed)
-----   Message from Festus Aloe, MD sent at 03/04/2017  4:16 PM EDT ----- Yes, patient needs to stop ASA (need clearance from cardiology). Thanks.

## 2017-03-16 ENCOUNTER — Encounter: Payer: Self-pay | Admitting: Urology

## 2017-03-16 ENCOUNTER — Ambulatory Visit (INDEPENDENT_AMBULATORY_CARE_PROVIDER_SITE_OTHER): Payer: Medicare HMO | Admitting: Urology

## 2017-03-16 VITALS — BP 158/82 | HR 64 | Ht 71.0 in | Wt 166.2 lb

## 2017-03-16 DIAGNOSIS — N433 Hydrocele, unspecified: Secondary | ICD-10-CM

## 2017-03-16 DIAGNOSIS — N138 Other obstructive and reflux uropathy: Secondary | ICD-10-CM | POA: Diagnosis not present

## 2017-03-16 DIAGNOSIS — N401 Enlarged prostate with lower urinary tract symptoms: Secondary | ICD-10-CM | POA: Diagnosis not present

## 2017-03-16 LAB — URINALYSIS, COMPLETE
BILIRUBIN UA: NEGATIVE
Glucose, UA: NEGATIVE
Ketones, UA: NEGATIVE
Leukocytes, UA: NEGATIVE
Nitrite, UA: NEGATIVE
PH UA: 5.5 (ref 5.0–7.5)
Protein, UA: NEGATIVE
RBC UA: NEGATIVE
Specific Gravity, UA: 1.025 (ref 1.005–1.030)
UUROB: 0.2 mg/dL (ref 0.2–1.0)

## 2017-03-16 NOTE — Progress Notes (Signed)
03/16/2017 8:27 AM   [name] [birth year] [id number]  Referring provider: Dion Body, MD [facility] [place], [place] [address]  No chief complaint on file. Right hydrocele   HPI: Please see PA McGowan's 12/16/2016 note for patient's GU hx -- due for PSA and DRE 11/2017. He returns for right hydrocele management. He's had a right hydrocele for a # of years. He had it aspirated 3-4 yrs ago and it has slowly enlarged. It is bothersome and uncomfortable. It has gotten larger over the years and pushes on his thigh. He was seen 02/10/2017 to consider aspiration but had no recent imaging. It was probably larger when it was aspirated previously. He has had an AVR and is on ASA 81 mg BID.  He underwent a scrotal US 02/24/2017. I reviewed the images. This revealed tubular ectasia right rete testes, no testicle masses, right hydrocele (7.5 x 4.6) and 1.5 cm left spermatocele.    PMH: Past Medical History:  Diagnosis Date  . Back pain   . BPH (benign prostatic hypertrophy)   . Dysrhythmia   . Elevated PSA   . Heart murmur   . Hydrocele   . Hypertension   . Myocardial infarction ([place])   . Nocturia   . Stroke ([place])    ministroke  . Urinary frequency     Surgical History: Past Surgical History:  Procedure Laterality Date  . AORTIC VALVE REPLACEMENT N/A 11/15/2013   Procedure: AORTIC VALVE REPLACEMENT (AVR);  Surgeon: Gaye Pollack, MD;  Location: [facility];  Service: Open Heart Surgery;  Laterality: N/A;  . COLONOSCOPY WITH PROPOFOL N/A 02/15/2016   Procedure: COLONOSCOPY WITH PROPOFOL;  Surgeon: Lollie Sails, MD;  Location[facility] ENDOSCOPY;  Service: Endoscopy;  Laterality: N/A;  . [place]   bilateral - inguinal   . INTRAOPERATIVE TRANSESOPHAGEAL ECHOCARDIOGRAM N/A 11/15/2013   Procedure: INTRAOPERATIVE TRANSESOPHAGEAL ECHOCARDIOGRAM;  Surgeon: Gaye Pollack, MD;  Location: [facility] OR;  Service: Open Heart Surgery;  Laterality: N/A;  . LEFT  HEART CATHETERIZATION WITH CORONARY ANGIOGRAM N/A 11/02/2013   Procedure: LEFT HEART CATHETERIZATION WITH CORONARY ANGIOGRAM;  Surgeon: Laverda Page, MD;  Location: [facility] CATH LAB;  Service: Cardiovascular;  Laterality: N/A;    Home Medications:  Allergies as of 03/16/2017   No Known Allergies     Medication List       Accurate as of 03/16/17  8:27 AM. Always use your most recent med list.          aspirin 81 MG chewable tablet Chew 81 mg by mouth 2 (two) times daily.   atorvastatin 10 MG tablet Commonly known as:  LIPITOR Take 10 mg by mouth daily.   clobetasol cream 0.05 % Commonly known as:  TEMOVATE Apply 1 application topically 2 (two) times daily. prn   finasteride 5 MG tablet Commonly known as:  PROSCAR Take 1 tablet (5 mg total) by mouth daily.   oxybutynin 5 MG tablet Commonly known as:  DITROPAN Take 1 tablet (5 mg total) by mouth 3 (three) times daily.   prednisoLONE acetate 1 % ophthalmic suspension Commonly known as:  PRED FORTE 1 drop in the surgical eye three times a day for 2 weeks. Start after surgery.   tamsulosin 0.4 MG Caps capsule Commonly known as:  FLOMAX Take 1 capsule (0.4 mg total) by mouth daily.   tolterodine 2 MG 24 hr capsule Commonly known as:  DETROL LA Take 1 capsule (2 mg total) by  mouth daily.   traMADol 50 MG tablet Commonly known as:  ULTRAM TAKE ONE TABLET BY MOUTH EVERY 6 HOURS AS NEEDED FOR MODERATE PAIN   traZODone 50 MG tablet Commonly known as:  DESYREL Take by mouth.   triamcinolone cream 0.1 % Commonly known as:  KENALOG Apply topically.   valsartan-hydrochlorothiazide 160-12.5 MG tablet Commonly known as:  DIOVAN-HCT Reported on 11/22/2015            Discharge Care Instructions        Start     Ordered   03/16/17 0000  Urinalysis, Complete     03/16/17 8295      Allergies: No Known Allergies  Family History: Family History  Problem Relation Age of Onset  . Prostate cancer Father   . Heart  attack Mother   . Hypertension Brother   . Coronary artery disease Brother   . Kidney disease Neg Hx   . Kidney cancer Neg Hx   . Bladder Cancer Neg Hx     Social History:  reports that he has quit smoking. His smoking use included Cigars and Pipe. He quit smokeless tobacco use about 43 years ago. He reports that he drinks alcohol. He reports that he does not use drugs.  ROS:                                        Physical Exam: There were no vitals taken for this visit.  Constitutional:  Alert and oriented, No acute distress. HEENT: [place] AT, moist mucus membranes.  Trachea midline, no masses. Cardiovascular: No clubbing, cyanosis, or edema. Respiratory: Normal respiratory effort, no increased work of breathing. GI: Abdomen is soft, nontender, nondistended, no abdominal masses GU: No CVA tenderness.  Skin: No rashes, bruises or suspicious lesions. Lymph: No cervical or inguinal adenopathy. Neurologic: Grossly intact, no focal deficits, moving all 4 extremities. Psychiatric: Normal mood and affect. GU: large right hydrocele, left testicle palpably normal (don't appreciate a spermatocele on exam).   Laboratory Data: Lab Results  Component Value Date   WBC 4.7 02/15/2016   HGB 12.4 (L) 02/15/2016   HCT 36.5 (L) 02/15/2016   MCV 89.4 02/15/2016   PLT 128 (L) 02/15/2016    Lab Results  Component Value Date   CREATININE 1.04 10/29/2014    No results found for: PSA  No results found for: TESTOSTERONE  Lab Results  Component Value Date   HGBA1C 5.5 11/11/2013    Urinalysis    Component Value Date/Time   COLORURINE YELLOW (A) 10/29/2014 1742   APPEARANCEUR CLEAR (A) 10/29/2014 1742   LABSPEC 1.016 10/29/2014 1742   PHURINE 5.0 10/29/2014 1742   GLUCOSEU NEGATIVE 10/29/2014 1742   HGBUR 2+ (A) 10/29/2014 1742   BILIRUBINUR NEGATIVE 10/29/2014 1742   KETONESUR NEGATIVE 10/29/2014 1742   PROTEINUR NEGATIVE 10/29/2014 1742   UROBILINOGEN 0.2  11/11/2013 1015   NITRITE NEGATIVE 10/29/2014 1742   LEUKOCYTESUR NEGATIVE 10/29/2014 1742    Pertinent Imaging: Scrotal US   Assessment & Plan:   1. Right hydrocele - discussed nature r/b of surveillance, aspiration and formal hydrocelectomy. Pt pretty adamant about aspiration. He said he had no problem last time and it lasted for a few years. Discussed risks of bleeding, infection, recurrence among others. We'll check to see if he can stop his ASA therapy with Dr. Einar Gip.   - Urinalysis, Complete   No Follow-up on  file.  Festus Aloe, [facility] [address] [place], [place] [address] [phone]

## 2017-03-19 ENCOUNTER — Telehealth: Payer: Self-pay

## 2017-03-19 NOTE — Telephone Encounter (Signed)
Patient notified Per Dr. Einar Gip he is cleared to stop his ASA 81mg  5 days prior to Hydrocele Aspiration.  Patient scheduled for 10-10 @1 :30

## 2017-04-01 ENCOUNTER — Ambulatory Visit (INDEPENDENT_AMBULATORY_CARE_PROVIDER_SITE_OTHER): Payer: Medicare HMO | Admitting: Urology

## 2017-04-01 ENCOUNTER — Encounter: Payer: Self-pay | Admitting: Urology

## 2017-04-01 VITALS — BP 134/68 | HR 58 | Ht 71.0 in | Wt 164.5 lb

## 2017-04-01 DIAGNOSIS — N430 Encysted hydrocele: Secondary | ICD-10-CM

## 2017-04-01 NOTE — Progress Notes (Signed)
   04/01/2017 2:00 PM   [name] 1946/05/10 [id number]  Referring provider: Dion Body, MD [facility] [place], [place] [address]  Chief Complaint  Patient presents with  . Hydrocele    HPI: Pt returns for hydrocele aspiration.    Physical Exam: BP 134/68 (BP Location: Right Arm, Patient Position: Sitting, Cuff Size: Normal)   Pulse (!) 58   Ht 5\' 11"  (1.803 m)   Wt 74.6 kg (164 lb 8 oz)   BMI 22.94 kg/m    Procedure: The scrotum was prepped. A small amount of local anesthetic was injected. Under direct ultrasound guidance a 22-gauge needle was advanced into the right hydrocele and and the fluid aspirated. 150 mL of clear straw-colored fluid was drained. The patient tolerated the procedure well. Pressure was held for a few minutes and then the site was covered with a stack of 4 x 4's to provide scrotal support in the underwear.  Assessment & Plan:   Right hydrocele-status post aspiration. He will notify us of any issues. He will follow-up June 2019 for yearly visit.  There are no diagnoses linked to this encounter.  No Follow-up on file.  Festus Aloe, [facility] [address], [place], [place] [address] [phone]

## 2017-04-22 ENCOUNTER — Other Ambulatory Visit: Payer: Self-pay

## 2017-04-22 DIAGNOSIS — N401 Enlarged prostate with lower urinary tract symptoms: Secondary | ICD-10-CM

## 2017-04-22 MED ORDER — OXYBUTYNIN CHLORIDE 5 MG PO TABS: 5.0000 mg | ORAL_TABLET | Freq: Three times a day (TID) | ORAL | 3 refills | Status: DC

## 2017-05-13 DIAGNOSIS — I10 Essential (primary) hypertension: Secondary | ICD-10-CM | POA: Diagnosis not present

## 2017-05-13 DIAGNOSIS — Z862 Personal history of diseases of the blood and blood-forming organs and certain disorders involving the immune mechanism: Secondary | ICD-10-CM | POA: Diagnosis not present

## 2017-05-13 DIAGNOSIS — E7800 Pure hypercholesterolemia, unspecified: Secondary | ICD-10-CM | POA: Diagnosis not present

## 2017-05-13 DIAGNOSIS — R7303 Prediabetes: Secondary | ICD-10-CM | POA: Diagnosis not present

## 2017-05-20 DIAGNOSIS — E7800 Pure hypercholesterolemia, unspecified: Secondary | ICD-10-CM | POA: Diagnosis not present

## 2017-05-20 DIAGNOSIS — Z862 Personal history of diseases of the blood and blood-forming organs and certain disorders involving the immune mechanism: Secondary | ICD-10-CM | POA: Diagnosis not present

## 2017-05-20 DIAGNOSIS — D696 Thrombocytopenia, unspecified: Secondary | ICD-10-CM | POA: Insufficient documentation

## 2017-05-20 DIAGNOSIS — I10 Essential (primary) hypertension: Secondary | ICD-10-CM | POA: Diagnosis not present

## 2017-05-20 DIAGNOSIS — R7303 Prediabetes: Secondary | ICD-10-CM | POA: Diagnosis not present

## 2017-05-27 DIAGNOSIS — Z953 Presence of xenogenic heart valve: Secondary | ICD-10-CM | POA: Diagnosis not present

## 2017-05-27 DIAGNOSIS — I10 Essential (primary) hypertension: Secondary | ICD-10-CM | POA: Diagnosis not present

## 2017-05-27 DIAGNOSIS — R0989 Other specified symptoms and signs involving the circulatory and respiratory systems: Secondary | ICD-10-CM | POA: Diagnosis not present

## 2017-05-27 DIAGNOSIS — E785 Hyperlipidemia, unspecified: Secondary | ICD-10-CM | POA: Diagnosis not present

## 2017-06-09 DIAGNOSIS — R0989 Other specified symptoms and signs involving the circulatory and respiratory systems: Secondary | ICD-10-CM | POA: Diagnosis not present

## 2017-06-26 DIAGNOSIS — S8012XA Contusion of left lower leg, initial encounter: Secondary | ICD-10-CM | POA: Diagnosis not present

## 2017-06-26 DIAGNOSIS — M79662 Pain in left lower leg: Secondary | ICD-10-CM | POA: Diagnosis not present

## 2017-06-26 DIAGNOSIS — S8992XA Unspecified injury of left lower leg, initial encounter: Secondary | ICD-10-CM | POA: Diagnosis not present

## 2017-06-26 DIAGNOSIS — M7989 Other specified soft tissue disorders: Secondary | ICD-10-CM | POA: Diagnosis not present

## 2017-07-13 ENCOUNTER — Telehealth: Payer: Self-pay | Admitting: Urology

## 2017-07-13 DIAGNOSIS — N401 Enlarged prostate with lower urinary tract symptoms: Secondary | ICD-10-CM

## 2017-07-13 NOTE — Telephone Encounter (Signed)
Pt called office asking for refill for oxybutin, states pharmacy has faxed requests with no response, instructed the pt to call the office to expedite this refill that's needed, [facility] mail order Fax # [phone]. Pt voices concerns about this. Please advise. Thanks.

## 2017-07-15 MED ORDER — OXYBUTYNIN CHLORIDE 5 MG PO TABS: 5.0000 mg | ORAL_TABLET | Freq: Three times a day (TID) | ORAL | 3 refills | Status: DC

## 2017-07-15 NOTE — Telephone Encounter (Signed)
Refills sent to [facility] mail order.

## 2017-08-13 ENCOUNTER — Ambulatory Visit: Payer: Medicare HMO

## 2017-10-21 ENCOUNTER — Other Ambulatory Visit: Payer: Self-pay | Admitting: Urology

## 2017-10-23 ENCOUNTER — Telehealth: Payer: Self-pay | Admitting: Urology

## 2017-10-23 DIAGNOSIS — N401 Enlarged prostate with lower urinary tract symptoms: Secondary | ICD-10-CM

## 2017-10-23 MED ORDER — OXYBUTYNIN CHLORIDE 5 MG PO TABS: 5.0000 mg | ORAL_TABLET | Freq: Three times a day (TID) | ORAL | 3 refills | Status: DC

## 2017-10-23 NOTE — Telephone Encounter (Signed)
Sent!

## 2017-10-23 NOTE — Telephone Encounter (Signed)
Pt needs refill faxed to [facility] for Oxybutynin 5 mg.  He doesn't have many left.

## 2017-11-19 DIAGNOSIS — I10 Essential (primary) hypertension: Secondary | ICD-10-CM | POA: Diagnosis not present

## 2017-11-19 DIAGNOSIS — E7800 Pure hypercholesterolemia, unspecified: Secondary | ICD-10-CM | POA: Diagnosis not present

## 2017-11-19 DIAGNOSIS — R7303 Prediabetes: Secondary | ICD-10-CM | POA: Diagnosis not present

## 2017-11-19 DIAGNOSIS — Z862 Personal history of diseases of the blood and blood-forming organs and certain disorders involving the immune mechanism: Secondary | ICD-10-CM | POA: Diagnosis not present

## 2017-11-26 DIAGNOSIS — I10 Essential (primary) hypertension: Secondary | ICD-10-CM | POA: Diagnosis not present

## 2017-11-26 DIAGNOSIS — R7303 Prediabetes: Secondary | ICD-10-CM | POA: Diagnosis not present

## 2017-11-26 DIAGNOSIS — E7800 Pure hypercholesterolemia, unspecified: Secondary | ICD-10-CM | POA: Diagnosis not present

## 2017-11-26 DIAGNOSIS — Z23 Encounter for immunization: Secondary | ICD-10-CM | POA: Diagnosis not present

## 2017-11-26 DIAGNOSIS — Z0000 Encounter for general adult medical examination without abnormal findings: Secondary | ICD-10-CM | POA: Diagnosis not present

## 2017-11-26 DIAGNOSIS — Z862 Personal history of diseases of the blood and blood-forming organs and certain disorders involving the immune mechanism: Secondary | ICD-10-CM | POA: Diagnosis not present

## 2017-11-26 DIAGNOSIS — D696 Thrombocytopenia, unspecified: Secondary | ICD-10-CM | POA: Diagnosis not present

## 2017-11-29 DIAGNOSIS — H6123 Impacted cerumen, bilateral: Secondary | ICD-10-CM | POA: Diagnosis not present

## 2017-12-10 ENCOUNTER — Other Ambulatory Visit: Payer: Self-pay

## 2017-12-10 ENCOUNTER — Telehealth: Payer: Self-pay | Admitting: Urology

## 2017-12-10 ENCOUNTER — Other Ambulatory Visit: Payer: Medicare HMO

## 2017-12-10 DIAGNOSIS — N401 Enlarged prostate with lower urinary tract symptoms: Secondary | ICD-10-CM | POA: Diagnosis not present

## 2017-12-10 NOTE — Telephone Encounter (Signed)
Called pt, informed him that there is no record of desmopressin on his medication list. Pt states he is unsure about this request, but needs no meds from [place] at this time.

## 2017-12-10 NOTE — Telephone Encounter (Signed)
Pt states he needs refill before his next appt next week. He only has 2 pills left. Desmopressin 0.2 mg please send to [facility] in [place]. Please advise pt. Thanks.

## 2017-12-10 NOTE — Telephone Encounter (Signed)
This message was sent incorrectly on the wrong patient, disregard

## 2017-12-11 LAB — PSA: PROSTATE SPECIFIC AG, SERUM: 1.1 ng/mL (ref 0.0–4.0)

## 2017-12-14 ENCOUNTER — Other Ambulatory Visit: Payer: Medicare HMO

## 2017-12-15 NOTE — Progress Notes (Signed)
8:[place] Apr 29, 1946 [id number]  Referring provider: Dion Body, MD [facility] [place], [place] [address]  Chief Complaint  Patient presents with  . Benign Prostatic Hypertrophy    HPI: Patient is 72 year old Caucasian male with history of elevated PSA, family history of prostate cancer, BPH with LUTS and a right hydrocele who presents today for a 12 month follow-up.  History of elevated PSA PSA Trend  5.5 in 12/2011 - refused biopsy and started on finasteride  3.0 (6.0) in 02/2012  1.6 (3.2) in 08/2012  1.8 (3.6) in 03/2013  2.2 (4.4) in 09/2013  1.1 (2.2) in 04/2015  1.2 (2.4) in 10/2015  1.5 (3.0) in 11/2016  1.1 (2.2) in 11/2017  BPH WITH LUTS His IPSS score today is 2, which is mild lower urinary tract symptomatology. He is pleased with his quality life due to his urinary symptoms.  His previous I PSS score was 2/0.  His main complaints today are frequency.  He has had them for several years.  He denies any dysuria, hematuria or suprapubic pain.   He currently taking tamsulosin 0.4 mg daily, finasteride 5 mg daily and oxybutynin 5 mg tid.  He also denies any recent fevers, chills, nausea or vomiting.     IPSS    Row Name 12/16/17 0800         International Prostate Symptom Score   How often have you had the sensation of not emptying your bladder?  Not at All     How often have you had to urinate less than every two hours?  Less than 1 in 5 times     How often have you found you stopped and started again several times when you urinated?  Less than 1 in 5 times     How often have you found it difficult to postpone urination?  Not at All     How often have you had a weak urinary stream?  Less than 1 in 5 times     How often have you had to strain to start urination?  Not at All     How many times did you typically get up at night to urinate?  2 Times     Total IPSS Score  5       Quality of Life due to urinary  symptoms   If you were to spend the rest of your life with your urinary condition just the way it is now how would you feel about that?  Pleased        Score:  1-7 Mild 8-19 Moderate 20-35 Severe   Family history of prostate cancer He has a family history of PCa, with his father having lethal prostate cancer.   Patient would like to continue PSA screening.  Right hydrocele Aspirated in the office in October 2018 with Dr. Junious Silk  PMH: Past Medical History:  Diagnosis Date  . Back pain   . BPH (benign prostatic hypertrophy)   . Dysrhythmia   . Elevated PSA   . Heart murmur   . Hydrocele   . Hypertension   . Myocardial infarction ([place])   . Nocturia   . Stroke ([place])    ministroke  . Urinary frequency     Surgical History: Past Surgical History:  Procedure Laterality Date  . AORTIC VALVE REPLACEMENT N/A 11/15/2013   Procedure: AORTIC VALVE REPLACEMENT (AVR);  Surgeon: Gaye Pollack, MD;  Location: [facility];  Service: Open Heart Surgery;  Laterality: N/A;  . COLONOSCOPY WITH PROPOFOL N/A 02/15/2016   Procedure: COLONOSCOPY WITH PROPOFOL;  Surgeon: Lollie Sails, MD;  Location: [facility] ENDOSCOPY;  Service: Endoscopy;  Laterality: N/A;  . [place]   bilateral - inguinal   . INTRAOPERATIVE TRANSESOPHAGEAL ECHOCARDIOGRAM N/A 11/15/2013   Procedure: INTRAOPERATIVE TRANSESOPHAGEAL ECHOCARDIOGRAM;  Surgeon: Gaye Pollack, MD;  Location: [facility] OR;  Service: Open Heart Surgery;  Laterality: N/A;  . LEFT HEART CATHETERIZATION WITH CORONARY ANGIOGRAM N/A 11/02/2013   Procedure: LEFT HEART CATHETERIZATION WITH CORONARY ANGIOGRAM;  Surgeon: Laverda Page, MD;  Location: [facility] CATH LAB;  Service: Cardiovascular;  Laterality: N/A;    Home Medications:  Allergies as of 12/16/2017   No Known Allergies     Medication List        Accurate as of 12/16/17  8:57 AM. Always use your most recent med list.          aspirin 81 MG chewable tablet Chew 81 mg by mouth 2 (two) times  daily.   atorvastatin 10 MG tablet Commonly known as:  LIPITOR Take 10 mg by mouth daily.   clobetasol cream 0.05 % Commonly known as:  TEMOVATE Apply 1 application topically 2 (two) times daily. prn   finasteride 5 MG tablet Commonly known as:  PROSCAR TAKE 1 TABLET (5 MG TOTAL) BY MOUTH DAILY.   oxybutynin 5 MG tablet Commonly known as:  DITROPAN Take 1 tablet (5 mg total) by mouth 3 (three) times daily.   tamsulosin 0.4 MG Caps capsule Commonly known as:  FLOMAX Take 1 capsule (0.4 mg total) by mouth daily.   traZODone 100 MG tablet Commonly known as:  DESYREL TAKE 2 TABLETS BY MOUTH NIGHTLY AS NEEDED FOR SLEEP   triamcinolone cream 0.1 % Commonly known as:  KENALOG Apply topically.   valsartan-hydrochlorothiazide 160-12.5 MG tablet Commonly known as:  DIOVAN-HCT Reported on 11/22/2015       Allergies: No Known Allergies  Family History: Family History  Problem Relation Age of Onset  . Prostate cancer Father   . Heart attack Mother   . Hypertension Brother   . Coronary artery disease Brother   . Kidney disease Neg Hx   . Kidney cancer Neg Hx   . Bladder Cancer Neg Hx     Social History:  reports that he has quit smoking. His smoking use included cigars and pipe. He quit smokeless tobacco use about 44 years ago. He reports that he drinks alcohol. He reports that he does not use drugs.  ROS: UROLOGY Frequent Urination?: No Hard to postpone urination?: No Burning/pain with urination?: No Get up at night to urinate?: Yes Leakage of urine?: No Urine stream starts and stops?: No Trouble starting stream?: No Do you have to strain to urinate?: No Blood in urine?: No Urinary tract infection?: No Sexually transmitted disease?: No Injury to kidneys or bladder?: No Painful intercourse?: No Weak stream?: No Erection problems?: No Penile pain?: No  Gastrointestinal Nausea?: No Vomiting?: No Indigestion/heartburn?: No Diarrhea?: No Constipation?:  No  Constitutional Fever: Yes Night sweats?: No Weight loss?: No Fatigue?: No  Skin Skin rash/lesions?: No Itching?: No  Eyes Blurred vision?: No Double vision?: No  Ears/Nose/Throat Sore throat?: No Sinus problems?: No  Hematologic/Lymphatic Swollen glands?: No Easy bruising?: No  Cardiovascular Leg swelling?: No Chest pain?: No  Respiratory Cough?: No Shortness of breath?: No  Endocrine Excessive thirst?: No  Musculoskeletal Back pain?: No Joint pain?: No  Neurological Headaches?:  No Dizziness?: No  Psychologic Depression?: No Anxiety?: No  Physical Exam: BP (!) 156/84 (BP Location: Right Arm, Patient Position: Sitting, Cuff Size: Normal)   Pulse 61   Wt 156 lb (70.8 kg)   BMI 21.76 kg/m  Constitutional: Well nourished. Alert and oriented, No acute distress. HEENT: [place] AT, moist mucus membranes. Trachea midline, no masses. Cardiovascular: No clubbing, cyanosis, or edema. Respiratory: Normal respiratory effort, no increased work of breathing. GI: Abdomen is soft, non tender, non distended, no abdominal masses. Liver and spleen not palpable.  Right reducible hernia is appreciated.  Stool sample for occult testing is not indicated.   GU: No CVA tenderness.  No bladder fullness or masses.  Patient with circumcised phallus.  Urethral meatus is patent.  No penile discharge. No penile lesions or rashes. Scrotum without lesions, cysts, rashes and/or edema.  Testicles are located scrotally bilaterally. No masses are appreciated in the testicles. Left and right epididymis are normal. Rectal: Patient with  normal sphincter tone. Anus and perineum without scarring or rashes. No rectal masses are appreciated. Prostate is approximately 60 grams, no nodules are appreciated. Seminal vesicles are normal. Skin: No rashes, bruises or suspicious lesions. Lymph: No cervical or inguinal adenopathy. Neurologic: Grossly intact, no focal deficits, moving all 4  extremities. Psychiatric: Normal mood and affect.    Laboratory Data: Lab Results  Component Value Date   WBC 4.7 02/15/2016   HGB 12.4 (L) 02/15/2016   HCT 36.5 (L) 02/15/2016   MCV 89.4 02/15/2016   PLT 128 (L) 02/15/2016    Lab Results  Component Value Date   CREATININE 1.04 10/29/2014    I have reviewed the labs.  Assessment & Plan:    1. BPH (benign prostatic hyperplasia) with LUTS:    I PSS score is 2/1. He will continue the tamsulosin, finasteride and tolterodine.  Refills for these medications are given at this time. He will return in 12 months for I PSS score, exam and PSA.  2. History of elevated PSA:   Patient has a history of elevated PSA.  PSA's have trended low for the last few years.  We will see him annually.    3. Family history of prostate cancer  - Patient has a history of elevated PSA and a family history of lethal prostate cancer his father  - PSA's have trended low for the last few years  - We will see him annually for PSA's and exams   4. Right hydrocele/right inguinal hernia Patient is interested in a Right Hydrocelectomy at this time  Will refer to general surgery as we will need to coordinate with them for hydrocelectomy at the time of hernia surgery  Return in about 1 year (around 12/17/2018) for IPSS, PSA and exam.  Zara Council, [facility]  [place] [address] [place], [place] [address] [phone]

## 2017-12-16 ENCOUNTER — Encounter: Payer: Self-pay | Admitting: Urology

## 2017-12-16 ENCOUNTER — Ambulatory Visit: Payer: Medicare HMO | Admitting: Urology

## 2017-12-16 VITALS — BP 156/84 | HR 61 | Wt 156.0 lb

## 2017-12-16 DIAGNOSIS — K4090 Unilateral inguinal hernia, without obstruction or gangrene, not specified as recurrent: Secondary | ICD-10-CM | POA: Diagnosis not present

## 2017-12-16 DIAGNOSIS — Z87898 Personal history of other specified conditions: Secondary | ICD-10-CM | POA: Diagnosis not present

## 2017-12-16 DIAGNOSIS — N433 Hydrocele, unspecified: Secondary | ICD-10-CM | POA: Diagnosis not present

## 2017-12-16 DIAGNOSIS — Z8042 Family history of malignant neoplasm of prostate: Secondary | ICD-10-CM | POA: Diagnosis not present

## 2017-12-16 DIAGNOSIS — N138 Other obstructive and reflux uropathy: Secondary | ICD-10-CM

## 2017-12-16 DIAGNOSIS — N401 Enlarged prostate with lower urinary tract symptoms: Secondary | ICD-10-CM

## 2017-12-16 MED ORDER — TAMSULOSIN HCL 0.4 MG PO CAPS: 0.4000 mg | ORAL_CAPSULE | Freq: Every day | ORAL | 4 refills | Status: DC

## 2017-12-23 ENCOUNTER — Telehealth: Payer: Self-pay | Admitting: Urology

## 2017-12-23 NOTE — Telephone Encounter (Signed)
Pt would like for you to refer him out to a general surgeon.

## 2017-12-23 NOTE — Telephone Encounter (Signed)
Please let Mr. Escoto know that I have spoken with Dr. Erlene Quan regarding his hernia and hydronephrosis and she would like him to see a general surgery for the hernia.  They would coordinate a hernia repair and the hydrocelectomy at the same time.  Is there a Education officer, environmental he would like [place] to refer him to ?

## 2017-12-28 ENCOUNTER — Other Ambulatory Visit: Payer: Self-pay | Admitting: Urology

## 2017-12-28 DIAGNOSIS — N401 Enlarged prostate with lower urinary tract symptoms: Secondary | ICD-10-CM

## 2017-12-30 DIAGNOSIS — I10 Essential (primary) hypertension: Secondary | ICD-10-CM | POA: Insufficient documentation

## 2017-12-31 ENCOUNTER — Encounter: Payer: Self-pay | Admitting: Surgery

## 2017-12-31 ENCOUNTER — Ambulatory Visit: Payer: Medicare HMO | Admitting: Surgery

## 2017-12-31 ENCOUNTER — Ambulatory Visit: Payer: Medicare HMO | Admitting: Urology

## 2017-12-31 ENCOUNTER — Encounter: Payer: Self-pay | Admitting: Urology

## 2017-12-31 VITALS — BP 109/62 | HR 68 | Ht 70.0 in | Wt 153.0 lb

## 2017-12-31 VITALS — BP 146/72 | HR 49 | Temp 97.7°F | Ht 71.0 in | Wt 153.8 lb

## 2017-12-31 DIAGNOSIS — N430 Encysted hydrocele: Secondary | ICD-10-CM | POA: Diagnosis not present

## 2017-12-31 DIAGNOSIS — K4091 Unilateral inguinal hernia, without obstruction or gangrene, recurrent: Secondary | ICD-10-CM

## 2017-12-31 NOTE — Progress Notes (Signed)
Willie Freeman is an 72 y.o. male.   Chief Complaint: Inguinal hernia Consult requested by Dr. Netty Starring HPI: This a patient with a possible inguinal hernia.  Patient had bilateral inguinal hernias repaired at age 46 in the 54s.  He has no pain and no problems with it.  He does have a large right hydrocele which has been drained multiple times by the urologist.  There is a question of the need for excision of this hydrocele at some point and his primary care physician wanted to question the presence of a hernia.  He has no nausea vomiting fevers or chills.   Past Medical History:  Diagnosis Date  . Back pain   . Borderline diabetes mellitus 11/09/2015  . BPH (benign prostatic hypertrophy)   . Dysrhythmia   . Elevated PSA   . Heart murmur   . History of elevated PSA 05/21/2015  . Hydrocele   . Hypertension   . Myocardial infarction ([place])   . Nocturia   . Stroke ([place])    ministroke  . TIA (transient ischemic attack) 11/02/2013  . Urinary frequency     Past Surgical History:  Procedure Laterality Date  . AORTIC VALVE REPLACEMENT N/A 11/15/2013   Procedure: AORTIC VALVE REPLACEMENT (AVR);  Surgeon: Gaye Pollack, MD;  Location: [facility];  Service: Open Heart Surgery;  Laterality: N/A;  . COLONOSCOPY WITH PROPOFOL N/A 02/15/2016   Procedure: COLONOSCOPY WITH PROPOFOL;  Surgeon: Lollie Sails, MD;  Location: [facility] ENDOSCOPY;  Service: Endoscopy;  Laterality: N/A;  . [place]   bilateral - inguinal   . INTRAOPERATIVE TRANSESOPHAGEAL ECHOCARDIOGRAM N/A 11/15/2013   Procedure: INTRAOPERATIVE TRANSESOPHAGEAL ECHOCARDIOGRAM;  Surgeon: Gaye Pollack, MD;  Location: [facility] OR;  Service: Open Heart Surgery;  Laterality: N/A;  . LEFT HEART CATHETERIZATION WITH CORONARY ANGIOGRAM N/A 11/02/2013   Procedure: LEFT HEART CATHETERIZATION WITH CORONARY ANGIOGRAM;  Surgeon: Laverda Page, MD;  Location: [facility] CATH LAB;  Service: Cardiovascular;  Laterality: N/A;    Family History  Problem  Relation Age of Onset  . Prostate cancer Father   . Heart attack Mother   . Hypertension Brother   . Coronary artery disease Brother   . Kidney disease Neg Hx   . Kidney cancer Neg Hx   . Bladder Cancer Neg Hx    Social History:  reports that he has quit smoking. His smoking use included cigars and pipe. He quit smokeless tobacco use about 44 years ago. He reports that he drinks alcohol. He reports that he does not use drugs.  Allergies: No Known Allergies   (Not in a hospital admission)   Review of Systems:   Review of Systems  Constitutional: Negative.   HENT: Negative.   Eyes: Negative.   Respiratory: Negative.   Cardiovascular: Negative.   Gastrointestinal: Negative.   Genitourinary: Negative.   Musculoskeletal: Negative.   Skin: Negative.   Neurological: Negative.   Endo/Heme/Allergies: Negative.   Psychiatric/Behavioral: Negative.     Physical Exam:  Physical Exam  Constitutional: He is oriented to person, place, and time. He appears well-developed and well-nourished. No distress.  HENT:  Head: Normocephalic and atraumatic.  Eyes: Pupils are equal, round, and reactive to light. EOM are normal. Right eye exhibits no discharge. Left eye exhibits no discharge. No scleral icterus.  Neck: Normal range of motion. Neck supple.  Cardiovascular: Normal rate, regular rhythm and normal heart sounds.  Pulmonary/Chest: Effort normal and breath sounds normal. No respiratory distress.  Abdominal: Soft. He  exhibits no distension. There is no tenderness. There is no guarding.  Bilateral groin scars  Genitourinary: Penis normal.  Genitourinary Comments: Large right hydrocele Patient examined standing supine and with Valsalva.  Recurrent right inguinal hernia is noticeable and reducible.  No left recurrence.  Musculoskeletal: Normal range of motion. He exhibits no edema or deformity.  Neurological: He is alert and oriented to person, place, and time.  Skin: Skin is warm and  dry. He is not diaphoretic.  Vitals reviewed.   There were no vitals taken for this visit.    No results found for this or any previous visit (from the past 48 hour(s)). No results found.   Assessment/Plan This is a patient with a recurrent right inguinal hernia.  He also has a large right hydrocele.  His hydrocele recurs every 3 to 4 years.  It is only been aspirated 3 times in 9 years according to the patient.  This is almost asymptomatic.  I do not believe that his hernia requires repair at this time.  If it became symptomatic it could be repaired laparoscopically at any time.  If his hydrocele is causing him problems aspiration is certainly indicated and with the occurrence of in frequent recurrences on a multi-year basis surgical intervention would be at the discretion of the urologist.  Discussed with the patient he understood and agreed with this plan he will follow-up on an as-needed basis  Florene Glen, MD, FACS

## 2017-12-31 NOTE — Progress Notes (Signed)
12/31/2017 10:12 PM   Willie Freeman April 07, 1946 [id number]  Referring provider: Dion Body, MD [place] [facility] [place], [place] [address]  Chief Complaint  Patient presents with  . Hydrocele    follow up    HPI: 72 year old male presents for follow-up of a right hydrocele.  He recently saw Larene Beach for his regular visit and was complaining of increased size of his right hydrocele.  He was also found to have a right inguinal hernia and was referred to general surgery.  He saw Dr. Burt Knack who felt his hernia was small and did not recommend treatment unless it was bothersome.  He states his hydrocele has been aspirated at least 3-4 times over the last 10 years.  It was last aspirated by Dr. Junious Silk in October 2018.  He presents today to discuss aspiration.   PMH: Past Medical History:  Diagnosis Date  . Back pain   . Borderline diabetes mellitus 11/09/2015  . BPH (benign prostatic hypertrophy)   . Dysrhythmia   . Elevated PSA   . Heart murmur   . History of elevated PSA 05/21/2015  . Hydrocele   . Hypertension   . Myocardial infarction ([employer])   . Nocturia   . Stroke ([place])    ministroke  . TIA (transient ischemic attack) 11/02/2013  . Urinary frequency     Surgical History: Past Surgical History:  Procedure Laterality Date  . AORTIC VALVE REPLACEMENT N/A 11/15/2013   Procedure: AORTIC VALVE REPLACEMENT (AVR);  Surgeon: Gaye Pollack, MD;  Location: [facility];  Service: Open Heart Surgery;  Laterality: N/A;  . COLONOSCOPY WITH PROPOFOL N/A 02/15/2016   Procedure: COLONOSCOPY WITH PROPOFOL;  Surgeon: Lollie Sails, MD;  Location: [facility] ENDOSCOPY;  Service: Endoscopy;  Laterality: N/A;  . [place]   bilateral - inguinal   . INTRAOPERATIVE TRANSESOPHAGEAL ECHOCARDIOGRAM N/A 11/15/2013   Procedure: INTRAOPERATIVE TRANSESOPHAGEAL ECHOCARDIOGRAM;  Surgeon: Gaye Pollack, MD;  Location: [facility] OR;  Service: Open Heart Surgery;  Laterality: N/A;   . LEFT HEART CATHETERIZATION WITH CORONARY ANGIOGRAM N/A 11/02/2013   Procedure: LEFT HEART CATHETERIZATION WITH CORONARY ANGIOGRAM;  Surgeon: Laverda Page, MD;  Location: [facility] CATH LAB;  Service: Cardiovascular;  Laterality: N/A;    Home Medications:  Allergies as of 12/31/2017   No Known Allergies     Medication List        Accurate as of 12/31/17 10:12 PM. Always use your most recent med list.          aspirin 81 MG chewable tablet Chew 81 mg by mouth 2 (two) times daily.   atorvastatin 10 MG tablet Commonly known as:  LIPITOR Take 10 mg by mouth daily.   finasteride 5 MG tablet Commonly known as:  PROSCAR TAKE 1 TABLET (5 MG TOTAL) BY MOUTH DAILY.   oxybutynin 5 MG tablet Commonly known as:  DITROPAN Take 1 tablet (5 mg total) by mouth 3 (three) times daily.   tamsulosin 0.4 MG Caps capsule Commonly known as:  FLOMAX Take 1 capsule (0.4 mg total) by mouth daily.   traZODone 100 MG tablet Commonly known as:  DESYREL TAKE 2 TABLETS BY MOUTH NIGHTLY AS NEEDED FOR SLEEP   triamcinolone cream 0.1 % Commonly known as:  KENALOG Apply topically.   valsartan-hydrochlorothiazide 160-12.5 MG tablet Commonly known as:  DIOVAN-HCT Reported on 11/22/2015       Allergies: No Known Allergies  Family History: Family History  Problem Relation Age of Onset  .  Prostate cancer Father   . Heart attack Mother   . Hypertension Brother   . Coronary artery disease Brother   . Kidney disease Neg Hx   . Kidney cancer Neg Hx   . Bladder Cancer Neg Hx     Social History:  reports that he has quit smoking. His smoking use included cigars and pipe. He quit smokeless tobacco use about 44 years ago. He reports that he drinks alcohol. He reports that he does not use drugs.  ROS: UROLOGY Frequent Urination?: No Hard to postpone urination?: No Burning/pain with urination?: No Get up at night to urinate?: Yes Leakage of urine?: No Urine stream starts and stops?: No Trouble  starting stream?: No Do you have to strain to urinate?: No Blood in urine?: No Urinary tract infection?: No Sexually transmitted disease?: No Injury to kidneys or bladder?: No Painful intercourse?: No Weak stream?: No Erection problems?: No Penile pain?: No  Gastrointestinal Nausea?: No Vomiting?: No Indigestion/heartburn?: No Diarrhea?: No Constipation?: No  Constitutional Fever: No Night sweats?: No Weight loss?: No Fatigue?: No  Skin Skin rash/lesions?: No Itching?: Yes  Eyes Blurred vision?: No Double vision?: No  Ears/Nose/Throat Sore throat?: No Sinus problems?: No  Hematologic/Lymphatic Swollen glands?: No Easy bruising?: No  Cardiovascular Leg swelling?: No Chest pain?: No  Respiratory Cough?: No Shortness of breath?: No  Endocrine Excessive thirst?: No  Musculoskeletal Back pain?: No Joint pain?: No  Neurological Headaches?: No Dizziness?: No  Psychologic Depression?: No Anxiety?: No  Physical Exam: BP 109/62   Pulse 68   Ht 5\' 10"  (1.778 m)   Wt 153 lb (69.4 kg)   BMI 21.95 kg/m   Constitutional:  Alert and oriented, No acute distress. HEENT: [place] AT, moist mucus membranes.  Trachea midline, no masses. Cardiovascular: No clubbing, cyanosis, or edema. Respiratory: Normal respiratory effort, no increased work of breathing. GI: Abdomen is soft, nontender, nondistended, no abdominal masses GU: No CVA tenderness.  Moderate to large right hydrocele.  The right testis is not palpable. Lymph: No cervical or inguinal lymphadenopathy. Skin: No rashes, bruises or suspicious lesions. Neurologic: Grossly intact, no focal deficits, moving all 4 extremities. Psychiatric: Normal mood and affect.    Assessment & Plan:   72 year old male with a symptomatic right hydrocele which is reaccumulated since aspiration 9 months ago.  I discussed hydrocelectomy versus aspiration.  He would like to have one more aspiration and if there is relatively  quick reaccumulation he would then like to proceed with hydrocelectomy.   Abbie Sons, [place] [address] [place], [place] [address] [phone]

## 2017-12-31 NOTE — Patient Instructions (Addendum)
Please call our office if you have questions  or concerns, otherwise follow up with Urology.

## 2018-01-04 ENCOUNTER — Telehealth: Payer: Self-pay | Admitting: Urology

## 2018-01-04 NOTE — Telephone Encounter (Signed)
Patient called asking about a refill for his oxybutnin, I see where it was sent to the mail order pharmacy. Not sure if it required a PA or not. I called him back to speak with him and had to leave a message. Advised him of the script being sent in and that if he needed anything else to call back.  Can someone just check and see if we do have a PA request for him? Thanks,  Sharyn Lull

## 2018-01-06 NOTE — Telephone Encounter (Signed)
Spoke with [facility] and they shipped oxybutinin was shipped 01/04/2018.  Patient aware.

## 2018-02-11 ENCOUNTER — Ambulatory Visit: Payer: Medicare HMO | Admitting: Urology

## 2018-02-11 ENCOUNTER — Encounter: Payer: Self-pay | Admitting: Urology

## 2018-02-11 VITALS — BP 124/70 | HR 62 | Ht 70.0 in | Wt 151.8 lb

## 2018-02-11 DIAGNOSIS — N433 Hydrocele, unspecified: Secondary | ICD-10-CM

## 2018-02-11 NOTE — Progress Notes (Signed)
Procedure note:  Diagnosis: Symptomatic right hydrocele  Procedure: Hydrocele aspiration  Anesthesia: 1% plain Xylocaine  Indications: 72 year old male with a symptomatic right hydrocele.  He has undergone periodic aspiration and presents today requesting the same.  Refer to my previous note dated 12/31/2017.  Description: Physical exam remarkable for a moderate right hydrocele.  Scrotal sonogram was performed in the testis was located posteriorly.  The anterior scrotal skin was anesthetized and a 16-gauge Insyte catheter was placed and 145 mL of clear fluid was aspirated.  No significant bleeding was noted.  Plan: If he had fairly rapid reaccumulation of the hydrocele fluid he was interested in scheduling hydrocelectomy.  He will call should he desire this route.  He was also instructed to call for increased scrotal redness, pain, swelling or drainage.   John Giovanni, MD

## 2018-02-20 DIAGNOSIS — Z961 Presence of intraocular lens: Secondary | ICD-10-CM | POA: Diagnosis not present

## 2018-06-01 DIAGNOSIS — I10 Essential (primary) hypertension: Secondary | ICD-10-CM | POA: Diagnosis not present

## 2018-06-01 DIAGNOSIS — Z862 Personal history of diseases of the blood and blood-forming organs and certain disorders involving the immune mechanism: Secondary | ICD-10-CM | POA: Diagnosis not present

## 2018-06-01 DIAGNOSIS — E7800 Pure hypercholesterolemia, unspecified: Secondary | ICD-10-CM | POA: Diagnosis not present

## 2018-06-01 DIAGNOSIS — D696 Thrombocytopenia, unspecified: Secondary | ICD-10-CM | POA: Diagnosis not present

## 2018-06-01 DIAGNOSIS — R7303 Prediabetes: Secondary | ICD-10-CM | POA: Diagnosis not present

## 2018-06-08 DIAGNOSIS — Z862 Personal history of diseases of the blood and blood-forming organs and certain disorders involving the immune mechanism: Secondary | ICD-10-CM | POA: Diagnosis not present

## 2018-06-08 DIAGNOSIS — D696 Thrombocytopenia, unspecified: Secondary | ICD-10-CM | POA: Diagnosis not present

## 2018-06-08 DIAGNOSIS — R7303 Prediabetes: Secondary | ICD-10-CM | POA: Diagnosis not present

## 2018-06-08 DIAGNOSIS — F5101 Primary insomnia: Secondary | ICD-10-CM | POA: Diagnosis not present

## 2018-06-08 DIAGNOSIS — I10 Essential (primary) hypertension: Secondary | ICD-10-CM | POA: Diagnosis not present

## 2018-06-08 DIAGNOSIS — E7800 Pure hypercholesterolemia, unspecified: Secondary | ICD-10-CM | POA: Diagnosis not present

## 2018-06-10 ENCOUNTER — Encounter: Payer: Self-pay | Admitting: Cardiology

## 2018-06-10 DIAGNOSIS — Z953 Presence of xenogenic heart valve: Secondary | ICD-10-CM | POA: Diagnosis not present

## 2018-06-10 DIAGNOSIS — I10 Essential (primary) hypertension: Secondary | ICD-10-CM | POA: Diagnosis not present

## 2018-11-05 ENCOUNTER — Other Ambulatory Visit: Payer: Self-pay

## 2018-11-05 MED ORDER — OLMESARTAN MEDOXOMIL-HCTZ 20-12.5 MG PO TABS: 1.0000 | ORAL_TABLET | Freq: Every day | ORAL | 1 refills | Status: DC

## 2018-11-08 ENCOUNTER — Other Ambulatory Visit: Payer: Self-pay | Admitting: Urology

## 2018-11-08 DIAGNOSIS — N401 Enlarged prostate with lower urinary tract symptoms: Secondary | ICD-10-CM

## 2018-11-08 MED ORDER — OXYBUTYNIN CHLORIDE 5 MG PO TABS: 5.0000 mg | ORAL_TABLET | Freq: Three times a day (TID) | ORAL | 3 refills | Status: DC

## 2018-11-08 NOTE — Addendum Note (Signed)
Addended by: Tommy Rainwater on: 11/08/2018 02:04 PM   Modules accepted: Orders

## 2018-11-08 NOTE — Telephone Encounter (Signed)
Refill sent.

## 2018-11-08 NOTE — Telephone Encounter (Signed)
Pt came into office asked for a refill for Oxybutynin 5mg , through mail order. [facility]

## 2018-11-22 ENCOUNTER — Other Ambulatory Visit: Payer: Self-pay

## 2018-11-22 MED ORDER — ATORVASTATIN CALCIUM 10 MG PO TABS: 10.0000 mg | ORAL_TABLET | Freq: Every day | ORAL | 1 refills | Status: DC

## 2018-12-08 ENCOUNTER — Other Ambulatory Visit: Payer: Self-pay

## 2018-12-08 DIAGNOSIS — Z87898 Personal history of other specified conditions: Secondary | ICD-10-CM

## 2018-12-10 ENCOUNTER — Other Ambulatory Visit: Payer: Self-pay

## 2018-12-10 ENCOUNTER — Other Ambulatory Visit: Payer: Medicare Other

## 2018-12-10 DIAGNOSIS — Z87898 Personal history of other specified conditions: Secondary | ICD-10-CM

## 2018-12-11 LAB — PSA: Prostate Specific Ag, Serum: 1.2 ng/mL (ref 0.0–4.0)

## 2018-12-16 NOTE — Progress Notes (Signed)
4:02 PM   Willie Freeman 01/08/1946 [id number]  Referring provider: Dion Body, MD [place] [facility] [place],  [place] [address]  Chief Complaint  Patient presents with  . Benign Prostatic Hypertrophy    HPI: Patient is 73 year old Caucasian male with history of elevated PSA, family history of prostate cancer, BPH with LUTS and a right hydrocele who presents today for a 12 month follow-up.  History of elevated PSA PSA Trend  5.5 in 12/2011 - refused biopsy and started on finasteride  3.0 (6.0) in 02/2012  1.6 (3.2) in 08/2012  1.8 (3.6) in 03/2013  2.2 (4.4) in 09/2013  1.1 (2.2) in 04/2015  1.2 (2.4) in 10/2015  1.5 (3.0) in 11/2016  1.1 (2.2) in 11/2017  1.2 (2.4) in 11/2018  BPH WITH LUTS  (prostate and/or bladder) IPSS score: 3/0   PVR: 174 mL   Previous score: 2/1     Major complaint(s): No urinary complaints at this time.  Denies any dysuria, hematuria or suprapubic pain.   Currently taking: tamsulosin 0.4 mg, finasteride 5 mg and oxybutynin 5 mg TID.  He states that the oxybutynin is causing him horrible dry mouth and would like to change to a different medication.   Denies any recent fevers, chills, nausea or vomiting.  His father had lethal prostate cancer.   IPSS    Row Name 12/17/18 0800         International Prostate Symptom Score   How often have you had the sensation of not emptying your bladder?  Not at All     How often have you had to urinate less than every two hours?  Not at All     How often have you found you stopped and started again several times when you urinated?  Not at All     How often have you found it difficult to postpone urination?  Not at All     How often have you had a weak urinary stream?  Less than 1 in 5 times     How often have you had to strain to start urination?  Not at All     How many times did you typically get up at night to urinate?  2 Times     Total IPSS Score  3       Quality of  Life due to urinary symptoms   If you were to spend the rest of your life with your urinary condition just the way it is now how would you feel about that?  Delighted        Score:  1-7 Mild 8-19 Moderate 20-35 Severe   Right hydrocele Aspirated in the office in October 2018 with Dr. Junious Silk and in August 2019 with Dr. Bernardo Heater.  He would like it addressed surgically at this time.   PMH: Past Medical History:  Diagnosis Date  . Back pain   . Borderline diabetes mellitus 11/09/2015  . BPH (benign prostatic hypertrophy)   . Dysrhythmia   . Elevated PSA   . Heart murmur   . History of elevated PSA 05/21/2015  . Hydrocele   . Hypertension   . Myocardial infarction ([place])   . Nocturia   . Stroke ([place])    ministroke  . TIA (transient ischemic attack) 11/02/2013  . Urinary frequency     Surgical History: Past Surgical History:  Procedure Laterality Date  . AORTIC VALVE REPLACEMENT N/A 11/15/2013   Procedure: AORTIC VALVE REPLACEMENT (AVR);  Surgeon: Gaye Pollack, MD;  Location: [facility];  Service: Open Heart Surgery;  Laterality: N/A;  . COLONOSCOPY WITH PROPOFOL N/A 02/15/2016   Procedure: COLONOSCOPY WITH PROPOFOL;  Surgeon: Lollie Sails, MD;  Location: [facility] ENDOSCOPY;  Service: Endoscopy;  Laterality: N/A;  . [place]   bilateral - inguinal   . INTRAOPERATIVE TRANSESOPHAGEAL ECHOCARDIOGRAM N/A 11/15/2013   Procedure: INTRAOPERATIVE TRANSESOPHAGEAL ECHOCARDIOGRAM;  Surgeon: Gaye Pollack, MD;  Location: [facility] OR;  Service: Open Heart Surgery;  Laterality: N/A;  . LEFT HEART CATHETERIZATION WITH CORONARY ANGIOGRAM N/A 11/02/2013   Procedure: LEFT HEART CATHETERIZATION WITH CORONARY ANGIOGRAM;  Surgeon: Laverda Page, MD;  Location: [facility] CATH LAB;  Service: Cardiovascular;  Laterality: N/A;    Home Medications:  Allergies as of 12/17/2018   No Known Allergies     Medication List       Accurate as of December 17, 2018  4:02 PM. If you have any questions, ask your  nurse or doctor.        STOP taking these medications   pantoprazole 40 MG tablet Commonly known as: PROTONIX Stopped by: Rameses Ou, PA-C   traZODone 100 MG tablet Commonly known as: DESYREL Stopped by: Zara Council, PA-C   valsartan-hydrochlorothiazide 160-12.5 MG tablet Commonly known as: DIOVAN-HCT Stopped by: Zara Council, PA-C     TAKE these medications   aspirin 81 MG chewable tablet Chew 81 mg by mouth 2 (two) times daily.   atorvastatin 10 MG tablet Commonly known as: LIPITOR Take 1 tablet (10 mg total) by mouth daily.   finasteride 5 MG tablet Commonly known as: PROSCAR TAKE 1 TABLET (5 MG TOTAL) BY MOUTH DAILY.   mirabegron ER 25 MG Tb24 tablet Commonly known as: MYRBETRIQ Take 1 tablet (25 mg total) by mouth daily. Started by: Zara Council, PA-C   olmesartan-hydrochlorothiazide 20-12.5 MG tablet Commonly known as: BENICAR HCT Take 1 tablet by mouth daily.   oxybutynin 5 MG tablet Commonly known as: DITROPAN Take 1 tablet (5 mg total) by mouth 3 (three) times daily.   tamsulosin 0.4 MG Caps capsule Commonly known as: FLOMAX Take 1 capsule (0.4 mg total) by mouth daily.   triamcinolone cream 0.1 % Commonly known as: KENALOG Apply topically.       Allergies: No Known Allergies  Family History: Family History  Problem Relation Age of Onset  . Prostate cancer Father   . Heart attack Mother   . Hypertension Brother   . Coronary artery disease Brother   . Kidney disease Neg Hx   . Kidney cancer Neg Hx   . Bladder Cancer Neg Hx     Social History:  reports that he has quit smoking. His smoking use included cigars and pipe. He quit smokeless tobacco use about 45 years ago. He reports current alcohol use. He reports that he does not use drugs.  ROS: UROLOGY Frequent Urination?: No Hard to postpone urination?: No Burning/pain with urination?: No Get up at night to urinate?: No Leakage of urine?: No Urine stream starts and  stops?: No Trouble starting stream?: No Do you have to strain to urinate?: No Blood in urine?: No Urinary tract infection?: No Sexually transmitted disease?: No Injury to kidneys or bladder?: No Painful intercourse?: No Weak stream?: No Erection problems?: No Penile pain?: No  Gastrointestinal Nausea?: No Vomiting?: No Indigestion/heartburn?: No Diarrhea?: No Constipation?: No  Constitutional Fever: No Night sweats?: No Weight loss?: No Fatigue?: No  Skin Skin rash/lesions?: No Itching?: No  Eyes Blurred vision?: No Double vision?: No  Ears/Nose/Throat Sore throat?: No Sinus problems?: No  Hematologic/Lymphatic Swollen glands?: No Easy bruising?: No  Cardiovascular Leg swelling?: No Chest pain?: No  Respiratory Cough?: No Shortness of breath?: No  Endocrine Excessive thirst?: No  Musculoskeletal Back pain?: No Joint pain?: No  Neurological Headaches?: No Dizziness?: No  Psychologic Depression?: No Anxiety?: No  Physical Exam: BP (!) 146/73 (BP Location: Left Arm, Patient Position: Sitting, Cuff Size: Normal)   Pulse 67   Ht 5\' 10"  (1.778 m)   Wt 149 lb 14.4 oz (68 kg)   BMI 21.51 kg/m  Constitutional:  Well nourished. Alert and oriented, No acute distress. HEENT: [place] AT, moist mucus membranes.  Trachea midline, no masses. Cardiovascular: No clubbing, cyanosis, or edema. Respiratory: Normal respiratory effort, no increased work of breathing. GI: Abdomen is soft, non tender, non distended, no abdominal masses. Liver and spleen not palpable.  Right reducible inguinal hernia is appreciated.  Stool sample for occult testing is not indicated.   GU: No CVA tenderness.  No bladder fullness or masses.  Patient with circumcised phallus.  Urethral meatus is patent.  No penile discharge. No penile lesions or rashes. Scrotum without lesions, cysts, rashes and/or edema.  Moderate right hydrocele.  Testicles are located scrotally bilaterally. No masses are  appreciated in the testicles. Left and right epididymis are normal. Rectal: Patient with  normal sphincter tone. Anus and perineum without scarring or rashes. No rectal masses are appreciated. Prostate is approximately 60 grams, could only palpate the apex and midportion of the gland, no nodules are appreciated.  Skin: No rashes, bruises or suspicious lesions. Lymph: No inguinal adenopathy. Neurologic: Grossly intact, no focal deficits, moving all 4 extremities. Psychiatric: Normal mood and affect.  Laboratory Data: PSA Trend - see HPI  Lab Results  Component Value Date   WBC 4.7 02/15/2016   HGB 12.4 (L) 02/15/2016   HCT 36.5 (L) 02/15/2016   MCV 89.4 02/15/2016   PLT 128 (L) 02/15/2016    Lab Results  Component Value Date   CREATININE 1.04 10/29/2014    I have reviewed the labs.  Assessment & Plan:    1. Right hydrocele Patient is interested in a Right Hydrocelectomy at this time as he has had hydrocele aspiration x 2 Explained that hydrocelectomy consists of an incision made in the scrotum, draining the fluid and removing the tissue that forms the sac around the hydrocele and a drain may or may not be placed in the scrotum to allow more fluid to drain and then the incision is closed with absorbable sutures.   Explained that the  postoperative complications including hematoma, swelling, pain, infection, damage to surrounding structures were all discussed in detail. We also discussed the recurrence rate following hydrocelectomy around 10%. Discussed the postoperative care including scrotal support, NSAIDs, and avoidance of strenuous physical activity for several weeks. I also explained the risks of general anesthesia, such as: MI, CVA, paralysis, coma and/or death Mr. Mcpartlin has a history of MI, CHF, Stroke and valve replacement.  We will obtain cardiac clearance from his cardiologist, Dr. Einar Gip, to discontinue his ASA prior to surgery.    2. BPH with LUTS IPSS score is 3/0, it is  slightly worse  Continue conservative management, avoiding bladder irritants and timed voiding's Continue tamsulosin 0.4 mg daily and finasteride daily  Given a trial of Myrbetriq 25 mg daily samples - he will check with his new insurance to see if this medication is covered  RTC in 1 month for IPS'S, BP and PVR   3. History of elevated PSA Current PSA stable at 1.2 (2.4) RTC in one year for PSA   4. Family history of prostate cancer Father with lethal prostate cancer RTC in one year for PSA and exam    5. Right inguinal hernia No need for surgical intervention at this time per general surgery  Return for right hydrocelectomy .  Zara Council, PA-C  [facility] [address] [place], [place] [address] [phone]

## 2018-12-17 ENCOUNTER — Ambulatory Visit: Payer: Medicare Other | Admitting: Urology

## 2018-12-17 ENCOUNTER — Telehealth: Payer: Self-pay | Admitting: Urology

## 2018-12-17 ENCOUNTER — Encounter: Payer: Self-pay | Admitting: Urology

## 2018-12-17 ENCOUNTER — Other Ambulatory Visit: Payer: Self-pay

## 2018-12-17 VITALS — BP 146/73 | HR 67 | Ht 70.0 in | Wt 149.9 lb

## 2018-12-17 DIAGNOSIS — N430 Encysted hydrocele: Secondary | ICD-10-CM | POA: Diagnosis not present

## 2018-12-17 DIAGNOSIS — Z87898 Personal history of other specified conditions: Secondary | ICD-10-CM

## 2018-12-17 DIAGNOSIS — N401 Enlarged prostate with lower urinary tract symptoms: Secondary | ICD-10-CM

## 2018-12-17 DIAGNOSIS — Z8042 Family history of malignant neoplasm of prostate: Secondary | ICD-10-CM | POA: Diagnosis not present

## 2018-12-17 DIAGNOSIS — N138 Other obstructive and reflux uropathy: Secondary | ICD-10-CM | POA: Diagnosis not present

## 2018-12-17 DIAGNOSIS — K4090 Unilateral inguinal hernia, without obstruction or gangrene, not specified as recurrent: Secondary | ICD-10-CM

## 2018-12-17 LAB — BLADDER SCAN AMB NON-IMAGING: Scan Result: 174

## 2018-12-17 MED ORDER — MIRABEGRON ER 25 MG PO TB24: 25.0000 mg | ORAL_TABLET | Freq: Every day | ORAL | 0 refills | Status: DC

## 2018-12-17 NOTE — Telephone Encounter (Signed)
Patient was given the [facility] Information form below as well as the Instructions for Pre-Admission Testing form & a map of [facility].    [place], [place], [place] [address] Telephone: [phone] Fax: [phone]   Thank you for choosing [facility] for your upcoming surgery!  We are always here to assist in your urological needs.  Please read the following information with specific details for your upcoming appointments related to your surgery. Please contact Leah at [phone] with any questions.  The Name of Your Surgery: Right Hydrocelectomy Your Surgery Date: 01/17/2019 Your Surgeon: John Giovanni  Please call Same Day Surgery at [phone] between the hours of 1pm-3pm one day prior to your surgery. They will inform you of the time to arrive at Same Day Surgery which is located on the second floor of the [facility].   Please refer to the attached letter regarding instructions for Pre-Admission Testing. You will receive a call from the [place] office regarding your appointment with them.  The Pre-Admission Testing office is located at [place], on the first floor of the [place] [facility] in [place] (office is to the right as you enter through the [employer] of the [facility]). Please have all medications you are currently taking and your insurance card available.  A COVID-19 test will be required prior to surgery and once test is performed you will need to remain in quarantine until the day of surgery. Patient was advised to have nothing to eat or drink after midnight the night prior to surgery except that he may have only water until 2 hours before surgery with nothing to drink within 2 hours of surgery.  The patient states he currently takes 81mg  asa  & was informed to hold medication  for 7 days prior to surgery beginning on 01/10/2019 pending Dr.Ganji's clearance to do so. Patient's questions were answered and he expressed understanding of these instructions.

## 2018-12-29 ENCOUNTER — Other Ambulatory Visit: Payer: Self-pay | Admitting: Family Medicine

## 2018-12-29 MED ORDER — FESOTERODINE FUMARATE ER 4 MG PO TB24: 4.0000 mg | ORAL_TABLET | Freq: Every day | ORAL | 1 refills | Status: DC

## 2019-01-03 ENCOUNTER — Other Ambulatory Visit: Payer: Self-pay | Admitting: Radiology

## 2019-01-03 DIAGNOSIS — N433 Hydrocele, unspecified: Secondary | ICD-10-CM

## 2019-01-06 ENCOUNTER — Other Ambulatory Visit: Payer: Self-pay | Admitting: Radiology

## 2019-01-10 ENCOUNTER — Telehealth: Payer: Self-pay | Admitting: Urology

## 2019-01-10 NOTE — Telephone Encounter (Signed)
Willie Freeman will discontinue his OAB medications at this time as he is having a severe dry throat with the oxybutynin and the other medications are cost prohibitive.

## 2019-01-13 ENCOUNTER — Encounter
Admission: RE | Admit: 2019-01-13 | Discharge: 2019-01-13 | Disposition: A | Discharge status: Home / Self Care | Payer: Medicare Other | Source: Ambulatory Visit | Attending: Urology | Admitting: Urology

## 2019-01-13 ENCOUNTER — Other Ambulatory Visit: Payer: Self-pay

## 2019-01-13 DIAGNOSIS — Z1159 Encounter for screening for other viral diseases: Secondary | ICD-10-CM | POA: Diagnosis not present

## 2019-01-13 DIAGNOSIS — Z01818 Encounter for other preprocedural examination: Secondary | ICD-10-CM | POA: Diagnosis not present

## 2019-01-13 DIAGNOSIS — I10 Essential (primary) hypertension: Secondary | ICD-10-CM | POA: Insufficient documentation

## 2019-01-13 DIAGNOSIS — Z01812 Encounter for preprocedural laboratory examination: Secondary | ICD-10-CM | POA: Diagnosis present

## 2019-01-13 LAB — SARS CORONAVIRUS 2 (TAT 6-24 HRS): SARS Coronavirus 2: NEGATIVE

## 2019-01-13 NOTE — Patient Instructions (Signed)
Your procedure is scheduled on: Monday 01/17/19  Report to Dent. To find out your arrival time please call [phone] between 1PM - 3PM on Friday 01/14/19.   Remember: Instructions that are not followed completely may result in serious medical risk, up to and including death, or upon the discretion of your surgeon and anesthesiologist your surgery may need to be rescheduled.      _X__ 1. Do not eat food after midnight the night before your procedure.                 No gum chewing or hard candies. You may drink clear liquids up to 2 hours                 before you are scheduled to arrive for your surgery- DO NOT drink clear                 liquids within 2 hours of the start of your surgery.                 Clear Liquids include:  water, apple juice without pulp, clear carbohydrate                 drink such as Clearfast or Gatorade, Black Coffee or Tea (Do not add                 milk or creamer to coffee or tea).   __X__2.  On the morning of surgery brush your teeth with toothpaste and water, you may rinse your mouth with mouthwash if you wish.  Do not swallow any toothpaste or mouthwash.      _X__ 3.  No Alcohol for 24 hours before or after surgery.   __X__4.  Notify your doctor if there is any change in your medical condition      (cold, fever, infections).     Do not wear jewelry, make-up, hairpins, clips or nail polish. Do not wear lotions, powders, or perfumes.  Do not shave 48 hours prior to surgery. Men may shave face and neck. Do not bring valuables to the hospital.    [facility] is not responsible for any belongings or valuables.   Contacts, dentures/partials or body piercings may not be worn into surgery. Bring a case for your contacts, glasses or hearing aids, a denture cup will be supplied.    Patients discharged the day of surgery will not be allowed to drive home.    __X__ Take these medicines  the morning of surgery with A SIP OF WATER:     1. atorvastatin (LIPITOR) 10 MG tablet  2. fesoterodine (TOVIAZ) 4 MG TB24 tablet  3. finasteride (PROSCAR) 5 MG tablet  4. tamsulosin (FLOMAX) 0.4 MG CAPS capsule     __X__ Use CHG Soap as directed   __X__ Stop Blood Thinners: Aspirin according to Dr. Irven Shelling instructions.    __X__ Stop Anti-inflammatories 7 days before surgery such as Advil, Ibuprofen, Motrin, BC or Goodies Powder, Naprosyn, Naproxen, Aleve, Aspirin, Meloxicam. May take Tylenol if needed for pain or discomfort.    __X__ Do not begin taking any herbal supplements before your procedure.

## 2019-01-16 MED ORDER — CEFAZOLIN SODIUM-DEXTROSE 1-4 GM/50ML-% IV SOLN
1.0000 g | INTRAVENOUS | Status: AC
  Administered 2019-01-17: 1 g via INTRAVENOUS

## 2019-01-17 ENCOUNTER — Other Ambulatory Visit: Payer: Self-pay

## 2019-01-17 ENCOUNTER — Encounter
Admission: RE | Disposition: A | Discharge status: Home / Self Care | Payer: Self-pay | Source: Home / Self Care | Attending: Urology

## 2019-01-17 ENCOUNTER — Encounter: Payer: Self-pay | Admitting: Anesthesiology

## 2019-01-17 ENCOUNTER — Ambulatory Visit: Payer: Medicare Other | Admitting: Certified Registered Nurse Anesthetist

## 2019-01-17 ENCOUNTER — Ambulatory Visit
Admission: RE | Admit: 2019-01-17 | Discharge: 2019-01-17 | Disposition: A | Discharge status: Home / Self Care | Payer: Medicare Other | Attending: Urology | Admitting: Urology

## 2019-01-17 DIAGNOSIS — N433 Hydrocele, unspecified: Secondary | ICD-10-CM | POA: Diagnosis present

## 2019-01-17 DIAGNOSIS — Z87891 Personal history of nicotine dependence: Secondary | ICD-10-CM | POA: Insufficient documentation

## 2019-01-17 DIAGNOSIS — I252 Old myocardial infarction: Secondary | ICD-10-CM | POA: Insufficient documentation

## 2019-01-17 DIAGNOSIS — Z7982 Long term (current) use of aspirin: Secondary | ICD-10-CM | POA: Diagnosis not present

## 2019-01-17 DIAGNOSIS — K4090 Unilateral inguinal hernia, without obstruction or gangrene, not specified as recurrent: Secondary | ICD-10-CM | POA: Insufficient documentation

## 2019-01-17 DIAGNOSIS — R350 Frequency of micturition: Secondary | ICD-10-CM | POA: Diagnosis not present

## 2019-01-17 DIAGNOSIS — Z79899 Other long term (current) drug therapy: Secondary | ICD-10-CM | POA: Insufficient documentation

## 2019-01-17 DIAGNOSIS — Z8673 Personal history of transient ischemic attack (TIA), and cerebral infarction without residual deficits: Secondary | ICD-10-CM | POA: Insufficient documentation

## 2019-01-17 DIAGNOSIS — N430 Encysted hydrocele: Secondary | ICD-10-CM

## 2019-01-17 DIAGNOSIS — Z8042 Family history of malignant neoplasm of prostate: Secondary | ICD-10-CM | POA: Insufficient documentation

## 2019-01-17 DIAGNOSIS — I10 Essential (primary) hypertension: Secondary | ICD-10-CM | POA: Diagnosis not present

## 2019-01-17 DIAGNOSIS — N401 Enlarged prostate with lower urinary tract symptoms: Secondary | ICD-10-CM | POA: Diagnosis not present

## 2019-01-17 DIAGNOSIS — Z952 Presence of prosthetic heart valve: Secondary | ICD-10-CM | POA: Diagnosis not present

## 2019-01-17 HISTORY — PX: HYDROCELE EXCISION: SHX482

## 2019-01-17 SURGERY — HYDROCELECTOMY
Anesthesia: General | Laterality: Right

## 2019-01-17 MED ORDER — LIDOCAINE HCL (PF) 2 % IJ SOLN
INTRAMUSCULAR | Status: AC
  Filled 2019-01-17: qty 10

## 2019-01-17 MED ORDER — FAMOTIDINE 20 MG PO TABS
ORAL_TABLET | ORAL | Status: AC
  Filled 2019-01-17: qty 1

## 2019-01-17 MED ORDER — DEXAMETHASONE SODIUM PHOSPHATE 10 MG/ML IJ SOLN
INTRAMUSCULAR | Status: AC
  Filled 2019-01-17: qty 1

## 2019-01-17 MED ORDER — KETOROLAC TROMETHAMINE 30 MG/ML IJ SOLN
INTRAMUSCULAR | Status: AC
  Filled 2019-01-17: qty 1

## 2019-01-17 MED ORDER — DEXAMETHASONE SODIUM PHOSPHATE 10 MG/ML IJ SOLN
INTRAMUSCULAR | Status: DC | PRN
  Administered 2019-01-17: 5 mg via INTRAVENOUS

## 2019-01-17 MED ORDER — CEFAZOLIN SODIUM-DEXTROSE 1-4 GM/50ML-% IV SOLN
INTRAVENOUS | Status: AC
  Filled 2019-01-17: qty 50

## 2019-01-17 MED ORDER — ONDANSETRON HCL 4 MG/2ML IJ SOLN: 4.0000 mg | Freq: Once | INTRAMUSCULAR | Status: DC | PRN

## 2019-01-17 MED ORDER — BUPIVACAINE HCL 0.5 % IJ SOLN
INTRAMUSCULAR | Status: DC | PRN
  Administered 2019-01-17: 7 mL

## 2019-01-17 MED ORDER — ONDANSETRON HCL 4 MG/2ML IJ SOLN
INTRAMUSCULAR | Status: AC
  Filled 2019-01-17: qty 2

## 2019-01-17 MED ORDER — PROPOFOL 10 MG/ML IV BOLUS
INTRAVENOUS | Status: DC | PRN
  Administered 2019-01-17: 140 mg via INTRAVENOUS

## 2019-01-17 MED ORDER — HYDROCODONE-ACETAMINOPHEN 5-325 MG PO TABS: 1.0000 | ORAL_TABLET | ORAL | 0 refills | Status: DC | PRN

## 2019-01-17 MED ORDER — ROCURONIUM BROMIDE 50 MG/5ML IV SOLN
INTRAVENOUS | Status: AC
  Filled 2019-01-17: qty 1

## 2019-01-17 MED ORDER — GLYCOPYRROLATE 0.2 MG/ML IJ SOLN
INTRAMUSCULAR | Status: AC
  Filled 2019-01-17: qty 1

## 2019-01-17 MED ORDER — PHENYLEPHRINE HCL (PRESSORS) 10 MG/ML IV SOLN
INTRAVENOUS | Status: AC
  Filled 2019-01-17: qty 1

## 2019-01-17 MED ORDER — FENTANYL CITRATE (PF) 100 MCG/2ML IJ SOLN: 25.0000 ug | INTRAMUSCULAR | Status: DC | PRN

## 2019-01-17 MED ORDER — FAMOTIDINE 20 MG PO TABS
20.0000 mg | ORAL_TABLET | Freq: Once | ORAL | Status: AC
  Administered 2019-01-17: 20 mg via ORAL

## 2019-01-17 MED ORDER — KETOROLAC TROMETHAMINE 30 MG/ML IJ SOLN
INTRAMUSCULAR | Status: DC | PRN
  Administered 2019-01-17: 15 mg via INTRAVENOUS

## 2019-01-17 MED ORDER — SUCCINYLCHOLINE CHLORIDE 20 MG/ML IJ SOLN
INTRAMUSCULAR | Status: AC
  Filled 2019-01-17: qty 1

## 2019-01-17 MED ORDER — CEFAZOLIN SODIUM-DEXTROSE 2-4 GM/100ML-% IV SOLN
INTRAVENOUS | Status: AC
  Filled 2019-01-17: qty 100

## 2019-01-17 MED ORDER — SODIUM CHLORIDE 0.9 % IV SOLN
INTRAVENOUS | Status: DC | PRN
  Administered 2019-01-17: 50 ug/min via INTRAVENOUS

## 2019-01-17 MED ORDER — ONDANSETRON HCL 4 MG/2ML IJ SOLN
INTRAMUSCULAR | Status: DC | PRN
  Administered 2019-01-17: 4 mg via INTRAVENOUS

## 2019-01-17 MED ORDER — BUPIVACAINE HCL (PF) 0.5 % IJ SOLN
INTRAMUSCULAR | Status: AC
  Filled 2019-01-17: qty 30

## 2019-01-17 MED ORDER — FENTANYL CITRATE (PF) 100 MCG/2ML IJ SOLN
INTRAMUSCULAR | Status: AC
  Filled 2019-01-17: qty 2

## 2019-01-17 MED ORDER — EPHEDRINE SULFATE 50 MG/ML IJ SOLN
INTRAMUSCULAR | Status: DC | PRN
  Administered 2019-01-17: 5 mg via INTRAVENOUS
  Administered 2019-01-17: 10 mg via INTRAVENOUS
  Administered 2019-01-17: 5 mg via INTRAVENOUS
  Administered 2019-01-17 (×2): 10 mg via INTRAVENOUS

## 2019-01-17 MED ORDER — SULFAMETHOXAZOLE-TRIMETHOPRIM 800-160 MG PO TABS: 1.0000 | ORAL_TABLET | Freq: Two times a day (BID) | ORAL | 0 refills | Status: AC

## 2019-01-17 MED ORDER — LIDOCAINE HCL (CARDIAC) PF 100 MG/5ML IV SOSY
PREFILLED_SYRINGE | INTRAVENOUS | Status: DC | PRN
  Administered 2019-01-17: 80 mg via INTRAVENOUS

## 2019-01-17 MED ORDER — PROPOFOL 10 MG/ML IV BOLUS
INTRAVENOUS | Status: AC
  Filled 2019-01-17: qty 40

## 2019-01-17 MED ORDER — BACITRACIN ZINC 500 UNIT/GM EX OINT
TOPICAL_OINTMENT | CUTANEOUS | Status: AC
  Filled 2019-01-17: qty 28.35

## 2019-01-17 MED ORDER — FENTANYL CITRATE (PF) 100 MCG/2ML IJ SOLN
INTRAMUSCULAR | Status: DC | PRN
  Administered 2019-01-17 (×2): 25 ug via INTRAVENOUS
  Administered 2019-01-17: 50 ug via INTRAVENOUS

## 2019-01-17 MED ORDER — LACTATED RINGERS IV SOLN
INTRAVENOUS | Status: DC
  Administered 2019-01-17: 07:00:00 via INTRAVENOUS

## 2019-01-17 MED ORDER — PHENYLEPHRINE HCL (PRESSORS) 10 MG/ML IV SOLN
INTRAVENOUS | Status: DC | PRN
  Administered 2019-01-17 (×3): 50 ug via INTRAVENOUS

## 2019-01-17 SURGICAL SUPPLY — 33 items
BLADE CLIPPER SURG (BLADE) ×3 IMPLANT
BLADE SURG 15 STRL LF DISP TIS (BLADE) ×1 IMPLANT
BLADE SURG 15 STRL SS (BLADE) ×2
CANISTER SUCT 1200ML W/VALVE (MISCELLANEOUS) ×3 IMPLANT
CHLORAPREP W/TINT 26 (MISCELLANEOUS) ×3 IMPLANT
COVER WAND RF STERILE (DRAPES) ×3 IMPLANT
DRAIN PENROSE 1/4X12 LTX (DRAIN) ×3 IMPLANT
DRAPE LAPAROTOMY 77X122 PED (DRAPES) ×3 IMPLANT
DRSG GAUZE FLUFF 36X18 (GAUZE/BANDAGES/DRESSINGS) ×3 IMPLANT
ELECT CAUTERY BLADE 6.4 (BLADE) ×3 IMPLANT
ELECT REM PT RETURN 9FT ADLT (ELECTROSURGICAL) ×3
ELECTRODE REM PT RTRN 9FT ADLT (ELECTROSURGICAL) ×1 IMPLANT
GAUZE SPONGE 4X4 12PLY STRL (GAUZE/BANDAGES/DRESSINGS) IMPLANT
GLOVE BIO SURGEON STRL SZ8 (GLOVE) ×3 IMPLANT
GOWN STRL REUS W/ TWL LRG LVL3 (GOWN DISPOSABLE) ×1 IMPLANT
GOWN STRL REUS W/TWL LRG LVL3 (GOWN DISPOSABLE) ×2
GOWN STRL REUS W/TWL XL LVL4 (GOWN DISPOSABLE) ×5 IMPLANT
KIT TURNOVER KIT A (KITS) ×3 IMPLANT
LABEL OR SOLS (LABEL) ×1 IMPLANT
NDL HYPO 25X1 1.5 SAFETY (NEEDLE) ×1 IMPLANT
NEEDLE HYPO 25X1 1.5 SAFETY (NEEDLE) ×3 IMPLANT
NS IRRIG 500ML POUR BTL (IV SOLUTION) ×3 IMPLANT
PACK BASIN MINOR ARMC (MISCELLANEOUS) ×3 IMPLANT
SUPPORETR ATHLETIC LG (MISCELLANEOUS) ×1 IMPLANT
SUPPORTER ATHLETIC LG (MISCELLANEOUS) ×3
SUT CHROMIC 3 0 PS 2 (SUTURE) ×2 IMPLANT
SUT CHROMIC 3 0 SH 27 (SUTURE) ×2 IMPLANT
SUT ETHILON 3-0 FS-10 30 BLK (SUTURE)
SUT ETHILON NAB PS2 4-0 18IN (SUTURE) ×1 IMPLANT
SUT MNCRL 3-0 UNDYED SH (SUTURE) ×1 IMPLANT
SUT MONOCRYL 3-0 UNDYED (SUTURE) ×2
SUTURE EHLN 3-0 FS-10 30 BLK (SUTURE) ×1 IMPLANT
SYR 10ML LL (SYRINGE) ×3 IMPLANT

## 2019-01-17 NOTE — Transfer of Care (Signed)
Immediate Anesthesia Transfer of Care Note  Patient: Willie Freeman  Procedure(s) Performed: HYDROCELECTOMY ADULT (Right )  Patient Location: PACU  Anesthesia Type:General  Level of Consciousness: sedated  Airway & Oxygen Therapy: Patient Spontanous Breathing and Patient connected to face mask oxygen  Post-op Assessment: Report given to RN and Post -op Vital signs reviewed and stable  Post vital signs: Reviewed and stable  Last Vitals:  Vitals Value Taken Time  BP 118/60 01/17/19 0847  Temp 36.1 C 01/17/19 0847  Pulse 60 01/17/19 0847  Resp 9 01/17/19 0847  SpO2 100 % 01/17/19 0847  Vitals shown include unvalidated device data.  Last Pain:  Vitals:   01/17/19 0624  TempSrc: Tympanic  PainSc: 0-No pain         Complications: No apparent anesthesia complications

## 2019-01-17 NOTE — Anesthesia Preprocedure Evaluation (Addendum)
Anesthesia Evaluation  Patient identified by MRN, date of birth, ID band Patient awake    Reviewed: Allergy & Precautions, H&P , NPO status , Patient's Chart, lab work & pertinent test results, reviewed documented beta blocker date and time   History of Anesthesia Complications Negative for: history of anesthetic complications  Airway Mallampati: II  TM Distance: >3 FB Neck ROM: full    Dental no notable dental hx. (+) Teeth Intact   Pulmonary neg pulmonary ROS, former smoker,    Pulmonary exam normal breath sounds clear to auscultation       Cardiovascular Exercise Tolerance: Good hypertension, (-) angina+ Past MI  (-) CAD, (-) Cardiac Stents and (-) CABG Normal cardiovascular exam+ dysrhythmias + Valvular Problems/Murmurs (s/p AVR) AS  Rhythm:regular Rate:Normal     Neuro/Psych neg Seizures TIACVA, No Residual Symptoms negative psych ROS   GI/Hepatic Neg liver ROS, GERD  ,  Endo/Other  negative endocrine ROS  Renal/GU negative Renal ROS  negative genitourinary   Musculoskeletal   Abdominal   Peds  Hematology negative hematology ROS (+)   Anesthesia Other Findings Past Medical History: No date: Back pain No date: BPH (benign prostatic hypertrophy) No date: Dysrhythmia No date: Elevated PSA No date: Heart murmur No date: Hydrocele No date: Hypertension No date: Myocardial infarction (HCC) No date: Nocturia No date: Stroke (HCC)     Comment: ministroke No date: Urinary frequency   Reproductive/Obstetrics negative OB ROS                             Anesthesia Physical  Anesthesia Plan  ASA: III  Anesthesia Plan: General   Post-op Pain Management:    Induction: Intravenous  PONV Risk Score and Plan:   Airway Management Planned: LMA  Additional Equipment:   Intra-op Plan:   Post-operative Plan: Extubation in OR  Informed Consent: I have reviewed the patients History  and Physical, chart, labs and discussed the procedure including the risks, benefits and alternatives for the proposed anesthesia with the patient or authorized representative who has indicated his/her understanding and acceptance.     Dental Advisory Given  Plan Discussed with: Anesthesiologist, CRNA and Surgeon  Anesthesia Plan Comments:        Anesthesia Quick Evaluation

## 2019-01-17 NOTE — Anesthesia Post-op Follow-up Note (Signed)
Anesthesia QCDR form completed.        

## 2019-01-17 NOTE — Anesthesia Postprocedure Evaluation (Signed)
Anesthesia Post Note  Patient: Willie Freeman  Procedure(s) Performed: HYDROCELECTOMY ADULT (Right )  Patient location during evaluation: PACU Anesthesia Type: General Level of consciousness: awake and alert and oriented Pain management: pain level controlled Vital Signs Assessment: post-procedure vital signs reviewed and stable Respiratory status: spontaneous breathing Cardiovascular status: blood pressure returned to baseline Anesthetic complications: no     Last Vitals:  Vitals:   01/17/19 0917 01/17/19 0922  BP: 136/77 132/66  Pulse: 72 60  Resp: 18 20  Temp: (!) 36.2 C (!) 36 C  SpO2: 97% 98%    Last Pain:  Vitals:   01/17/19 0922  TempSrc: Temporal  PainSc: 0-No pain                 Ezri Landers

## 2019-01-17 NOTE — Discharge Instructions (Signed)
AMBULATORY SURGERY  DISCHARGE INSTRUCTIONS   1) The drugs that you were given will stay in your system until tomorrow so for the next 24 hours you should not:  A) Drive an automobile B) Make any legal decisions C) Drink any alcoholic beverage   2) You may resume regular meals tomorrow.  Today it is better to start with liquids and gradually work up to solid foods.  You may eat anything you prefer, but it is better to start with liquids, then soup and crackers, and gradually work up to solid foods.   3) Please notify your doctor immediately if you have any unusual bleeding, trouble breathing, redness and pain at the surgery site, drainage, fever, or pain not relieved by medication.    4) Additional Instructions:        Please contact your physician with any problems or Same Day Surgery at [phone], Monday through Friday 6 am to 4 pm, or [place] at [facility] number at [phone].Discharge instructions following scrotal surgery  Call your doctor for:  Fever is greater than 100.5  Severe nausea or vomiting  Increasing pain not controlled by pain medication  Increasing redness or drainage from incisions  The number for questions or concerns is [phone]  Activity level: No lifting greater than 10 pounds (about equal to gallon of milk) for the next 2 weeks or until cleared to do so at follow-up appointment.  Otherwise activity as tolerated by comfort level.  Diet: May resume your regular diet as tolerated  Driving: No driving while still taking opiate pain medications (weight at least 6-8 hours after last dose).  No driving if you still sore from surgery as it may limit your ability to react quickly if necessary.   Shower/bath: May shower 48 hours after surgery.  No tub bath, hot tub or swimming for 10 days.  Wound care: He may cover wounds with sterile gauze as needed to prevent incisions rubbing on close follow-up in any seepage.  Where tight  fitting underpants for at least 2 weeks.  He should apply cold compresses (ice or sac of frozen peas/corn) to your scrotum for at least 48 hours to reduce the swelling.  You should expect that his scrotum will swell up initially and then get smaller over the next 2-4 weeks.  You have a drain in place and may replace gauze as needed.  Medications: 1.  You may resume your regular medications 2.  Prescription for pain medication was sent to your pharmacy 3.  Antibiotic Rx was sent to pharmacy  Follow-up appointments: Follow-up appointment will be scheduled with Dr. Bernardo Heater in 6 weeks for a wound check. You will be contacted for an appointment for drain removal tomorrow 01/18/2019

## 2019-01-17 NOTE — Anesthesia Procedure Notes (Signed)
Procedure Name: LMA Insertion Performed by: Demetrius Charity, CRNA Pre-anesthesia Checklist: Patient identified, Patient being monitored, Timeout performed, Emergency Drugs available and Suction available Patient Re-evaluated:Patient Re-evaluated prior to induction Oxygen Delivery Method: Circle system utilized Preoxygenation: Pre-oxygenation with 100% oxygen Induction Type: IV induction Ventilation: Mask ventilation without difficulty LMA: LMA inserted LMA Size: 4.0 Tube type: Oral Number of attempts: 1 Placement Confirmation: positive ETCO2 and breath sounds checked- equal and bilateral Tube secured with: Tape Dental Injury: Teeth and Oropharynx as per pre-operative assessment

## 2019-01-17 NOTE — Interval H&P Note (Signed)
History and Physical Interval Note:  01/17/2019 7:33 AM  Willie Freeman  has presented today for surgery, with the diagnosis of right hydrocele.  The various methods of treatment have been discussed with the patient and family. After consideration of risks, benefits and other options for treatment, the patient has consented to  Procedure(s): HYDROCELECTOMY ADULT (Right) as a surgical intervention.  The patient's history has been reviewed, patient examined, no change in status, stable for surgery.  I have reviewed the patient's chart and labs.  Questions were answered to the patient's satisfaction.     [name]

## 2019-01-17 NOTE — H&P (Signed)
History and physical update:  Refer to [facility] office note of 12/17/2018.  He has a symptomatic right hydrocele who is failed aspiration x2 and desires hydrocelectomy.  CV: RRR Lungs: Clear  Impression: Symptomatic right hydrocele  Plan: Scheduled for right hydrocelectomy.  The procedure was cussed in detail including potential risks bleeding/hematoma, infection either potential requiring repeat surgery.  Hydrocele recurrence is unlikely.  The postoperative course of significant edema and a surgical drain postoperatively was discussed.  He indicated all questions were answered and desires to proceed.  John Giovanni, MD

## 2019-01-17 NOTE — Op Note (Signed)
Preoperative diagnosis:  1. Right hydrocele  Postoperative diagnosis:  1. Right hydrocele  Procedure: 1. Right hydrocelectomy  Surgeon: Abbie Sons, MD  Anesthesia: General  Complications: None  Intraoperative findings: Moderate right hydrocele.  Testis normal in appearance.  145 cc clear fluid obtained  EBL: Minimal  Specimens: None  Indication: Willie Freeman is a 73 y.o. patient with a moderate right hydrocele which has been symptomatic.  He has undergone aspiration x2 with reaccumulation.  After reviewing the management options for treatment, he elected to proceed with the above surgical procedure(s). We have discussed the potential benefits and risks of the procedure, side effects of the proposed treatment, the likelihood of the patient achieving the goals of the procedure, and any potential problems that might occur during the procedure or recuperation. Informed consent has been obtained.  Description of procedure:  The patient was taken to the operating room and general anesthesia was induced.  The patient was placed in the supine position, prepped and draped in the usual sterile fashion, and preoperative antibiotics were administered. A preoperative time-out was performed.   A transverse incision was made in the midportion of the right hemiscrotum.  Dartos fascia was incised with cautery to expose the hydrocele sac.  The hydrocele sac was then bluntly separated from scrotal walls with a combination of blunt dissection and cautery and delivered into the operative field.  The sac was then incised anteriorly and 145 mL of clear fluid was suctioned.  The hydrocele sac was then opened anteriorly with cautery.  The testis and cord structures were normal in appearance.  There was no evidence of a communicating hydrocele. The hydrocele sac was excised with cautery and the remnant was everted and the edges sutured with 3-0 Monocryl suture.  A 1/4 inch Penrose drain was placed through  the dependent portion of the right hemiscrotum via a separate stab incision and secured with 4-0 nylon.  The testis was delivered back into the scrotum in its anatomic position.  The incision was anesthetized with 0.25% plain Sensorcaine.  The dartos was closed with a running suture of 3-0 Monocryl and the skin was closed with a running horizontal mattress suture of 3-0 Monocryl.  A dressing of fluffs and scrotal support was applied.  The patient was transported to the PACU in stable condition after anesthetic reversal.   Abbie Sons, M.D.

## 2019-01-18 ENCOUNTER — Ambulatory Visit: Payer: Medicare Other | Admitting: *Deleted

## 2019-01-18 DIAGNOSIS — N433 Hydrocele, unspecified: Secondary | ICD-10-CM

## 2019-01-18 NOTE — Progress Notes (Signed)
Patient presented to office for penrose drain removal. Site is clean with no signs of infection. Area was clean, stitch cut and drain was removed without difficulty. Abdominal pad placed on area, instructed to wear snug fitting underwear. No complications noted.

## 2019-01-19 ENCOUNTER — Telehealth: Payer: Self-pay | Admitting: Urology

## 2019-01-19 NOTE — Telephone Encounter (Signed)
Patient was notified that this was normal and could last for a few days and since he is on blood thinners may last longer

## 2019-01-19 NOTE — Telephone Encounter (Signed)
Pt states that he is bleeding some since surgery, he wants to know if this is normal and how long will it last? Please advise.

## 2019-02-15 ENCOUNTER — Other Ambulatory Visit: Payer: Self-pay

## 2019-02-15 ENCOUNTER — Encounter: Payer: Self-pay | Admitting: Urology

## 2019-02-15 ENCOUNTER — Ambulatory Visit (INDEPENDENT_AMBULATORY_CARE_PROVIDER_SITE_OTHER): Payer: Medicare Other | Admitting: Urology

## 2019-02-15 VITALS — BP 142/73 | HR 59 | Ht 71.0 in | Wt 154.0 lb

## 2019-02-15 DIAGNOSIS — Z9889 Other specified postprocedural states: Secondary | ICD-10-CM

## 2019-02-15 NOTE — Progress Notes (Signed)
02/15/2019 8:54 AM   Willie Freeman [birth year] [id number]  Referring provider: Dion Body, MD [facility] [place],  [place] [address]  Chief Complaint  Patient presents with   Routine Post Op    Right hydrocele    HPI: 73 y.o. male status post right hydrocelectomy on 01/17/2019.  He had no postoperative problems and states he is doing well.  Drain was removed on 7/28.   PMH: Past Medical History:  Diagnosis Date   Back pain    Borderline diabetes mellitus 11/09/2015   BPH (benign prostatic hypertrophy)    Dysrhythmia    Elevated PSA    Heart murmur    History of elevated PSA 05/21/2015   Hydrocele    Hypertension    Myocardial infarction [facility])    Nocturia    Stroke ([place])    ministroke   TIA (transient ischemic attack) 11/02/2013   Urinary frequency     Surgical History: Past Surgical History:  Procedure Laterality Date   AORTIC VALVE REPLACEMENT N/A 11/15/2013   Procedure: AORTIC VALVE REPLACEMENT (AVR);  Surgeon: Gaye Pollack, MD;  Location: [place];  Service: Open Heart Surgery;  Laterality: N/A;   COLONOSCOPY WITH PROPOFOL N/A 02/15/2016   Procedure: COLONOSCOPY WITH PROPOFOL;  Surgeon: Lollie Sails, MD;  Location: [facility] ENDOSCOPY;  Service: Endoscopy;  Laterality: N/A;   HERNIA REPAIR  1956   bilateral - inguinal    HYDROCELE EXCISION Right 01/17/2019   Procedure: HYDROCELECTOMY ADULT;  Surgeon: Abbie Sons, MD;  Location: [facility] ORS;  Service: Urology;  Laterality: Right;   INTRAOPERATIVE TRANSESOPHAGEAL ECHOCARDIOGRAM N/A 11/15/2013   Procedure: INTRAOPERATIVE TRANSESOPHAGEAL ECHOCARDIOGRAM;  Surgeon: Gaye Pollack, MD;  Location: [facility] OR;  Service: Open Heart Surgery;  Laterality: N/A;   LEFT HEART CATHETERIZATION WITH CORONARY ANGIOGRAM N/A 11/02/2013   Procedure: LEFT HEART CATHETERIZATION WITH CORONARY ANGIOGRAM;  Surgeon: Laverda Page, MD;  Location: [facility] CATH LAB;  Service: Cardiovascular;   Laterality: N/A;    Home Medications:  Allergies as of 02/15/2019   No Known Allergies     Medication List       Accurate as of February 15, 2019  8:54 AM. If you have any questions, ask your nurse or doctor.        aspirin 81 MG chewable tablet Chew 81 mg by mouth 2 (two) times daily.   atorvastatin 10 MG tablet Commonly known as: LIPITOR Take 1 tablet (10 mg total) by mouth daily.   fesoterodine 4 MG Tb24 tablet Commonly known as: TOVIAZ Take 1 tablet (4 mg total) by mouth daily.   finasteride 5 MG tablet Commonly known as: PROSCAR TAKE 1 TABLET (5 MG TOTAL) BY MOUTH DAILY.   HYDROcodone-acetaminophen 5-325 MG tablet Commonly known as: NORCO/VICODIN Take 1 tablet by mouth every 4 (four) hours as needed for moderate pain.   olmesartan-hydrochlorothiazide 20-12.5 MG tablet Commonly known as: BENICAR HCT Take 1 tablet by mouth daily.   tamsulosin 0.4 MG Caps capsule Commonly known as: FLOMAX Take 1 capsule (0.4 mg total) by mouth daily.   traZODone 50 MG tablet Commonly known as: DESYREL Take 100 mg by mouth at bedtime. What changed: Another medication with the same name was removed. Continue taking this medication, and follow the directions you see here. Changed by: Abbie Sons, MD   triamcinolone cream 0.1 % Commonly known as: KENALOG Apply 1 application topically daily as needed (itching).       Allergies: No Known Allergies  Family History: Family History  Problem Relation Age of Onset   Prostate cancer Father    Heart attack Mother    Hypertension Brother    Coronary artery disease Brother    Kidney disease Neg Hx    Kidney cancer Neg Hx    Bladder Cancer Neg Hx     Social History:  reports that he has quit smoking. His smoking use included cigars and pipe. He quit smokeless tobacco use about 45 years ago. He reports current alcohol use. He reports that he does not use drugs.  ROS: UROLOGY Frequent Urination?: No Hard to postpone  urination?: No Burning/pain with urination?: No Get up at night to urinate?: No Leakage of urine?: No Urine stream starts and stops?: No Trouble starting stream?: No Do you have to strain to urinate?: No Blood in urine?: No Urinary tract infection?: No Sexually transmitted disease?: No Injury to kidneys or bladder?: No Painful intercourse?: No Weak stream?: No Erection problems?: No Penile pain?: No  Gastrointestinal Nausea?: No Vomiting?: No Indigestion/heartburn?: No Diarrhea?: No Constipation?: No  Constitutional Fever: No Night sweats?: No Weight loss?: No Fatigue?: No  Skin Skin rash/lesions?: No Itching?: No  Eyes Blurred vision?: No Double vision?: No  Ears/Nose/Throat Sore throat?: No Sinus problems?: No  Hematologic/Lymphatic Swollen glands?: No Easy bruising?: No  Cardiovascular Leg swelling?: No Chest pain?: No  Respiratory Cough?: No Shortness of breath?: No  Endocrine Excessive thirst?: No  Musculoskeletal Back pain?: No Joint pain?: No  Neurological Headaches?: No Dizziness?: No  Psychologic Depression?: No Anxiety?: No  Physical Exam: BP (!) 142/73    Pulse (!) 59    Ht 5\' 11"  (1.803 m)    Wt 154 lb (69.9 kg)    BMI 21.48 kg/m   Constitutional:  Alert and oriented, No acute distress. GU: Incision healed.  No significant right scrotal swelling.  Postoperative changes present.   Assessment & Plan:   Doing well status post right hydrocelectomy.  He is followed by Larene Beach for BPH with lower urinary tract symptoms and will schedule a follow-up June 2021.  Abbie Sons, [place] [address] [place], [place] [address] [phone]

## 2019-02-22 ENCOUNTER — Other Ambulatory Visit: Payer: Self-pay

## 2019-02-22 DIAGNOSIS — I10 Essential (primary) hypertension: Secondary | ICD-10-CM

## 2019-02-22 MED ORDER — OLMESARTAN MEDOXOMIL-HCTZ 20-12.5 MG PO TABS: 1.0000 | ORAL_TABLET | Freq: Every day | ORAL | 1 refills | Status: DC

## 2019-06-09 ENCOUNTER — Encounter: Payer: Self-pay | Admitting: Cardiology

## 2019-06-13 ENCOUNTER — Encounter: Payer: Self-pay | Admitting: Cardiology

## 2019-06-13 ENCOUNTER — Other Ambulatory Visit: Payer: Self-pay

## 2019-06-13 ENCOUNTER — Ambulatory Visit (INDEPENDENT_AMBULATORY_CARE_PROVIDER_SITE_OTHER): Payer: Medicare HMO | Admitting: Cardiology

## 2019-06-13 VITALS — BP 157/86 | HR 56 | Ht 71.0 in | Wt 157.9 lb

## 2019-06-13 DIAGNOSIS — E7800 Pure hypercholesterolemia, unspecified: Secondary | ICD-10-CM

## 2019-06-13 DIAGNOSIS — I10 Essential (primary) hypertension: Secondary | ICD-10-CM

## 2019-06-13 DIAGNOSIS — Z8673 Personal history of transient ischemic attack (TIA), and cerebral infarction without residual deficits: Secondary | ICD-10-CM

## 2019-06-13 DIAGNOSIS — Z953 Presence of xenogenic heart valve: Secondary | ICD-10-CM

## 2019-06-13 MED ORDER — AMLODIPINE BESYLATE 5 MG PO TABS: 5.0000 mg | ORAL_TABLET | Freq: Every day | ORAL | 2 refills | Status: DC

## 2019-06-13 NOTE — Progress Notes (Signed)
Primary Physician/Referring:  Dion Body, MD  Patient ID: Willie Freeman, male    DOB: [birth year], 73 y.o.   MRN: [id number]  Chief Complaint  Patient presents with  . av replacement  . Hypertension   HPI:    Willie Freeman  is a 73 y.o. Caucasian male with Aortic Valvular heart disease S/P Bioprosthetic AVR, hyperlipidemia and HTN, H/O NSTEMI on  11/02/2013 and also dysarthria and confusional state and was diagnosed as having TIA. His symptoms resolved, was taken emergently to the cath lab, was found to have no significant coronary artery disease but was found to have critical aortic stenosis. He underwent aortic valve replacement on 11/15/2013. This is his annual visit.   He is asymptomatic, doing well, no chest pain, dyspnea, TIA like symptoms.  Past Medical History:  Diagnosis Date  . Back pain   . Borderline diabetes mellitus 11/09/2015  . BPH (benign prostatic hypertrophy)   . Dysrhythmia   . Elevated PSA   . Heart murmur   . History of elevated PSA 05/21/2015  . Hydrocele   . Hypertension   . Myocardial infarction ([place])   . Nocturia   . Stroke ([place])    ministroke  . TIA (transient ischemic attack) 11/02/2013  . Urinary frequency    Past Surgical History:  Procedure Laterality Date  . AORTIC VALVE REPLACEMENT N/A 11/15/2013   Procedure: AORTIC VALVE REPLACEMENT (AVR);  Surgeon: Gaye Pollack, MD;  Location: [place];  Service: Open Heart Surgery;  Laterality: N/A;  . COLONOSCOPY WITH PROPOFOL N/A 02/15/2016   Procedure: COLONOSCOPY WITH PROPOFOL;  Surgeon: Lollie Sails, MD;  Location: [facility] ENDOSCOPY;  Service: Endoscopy;  Laterality: N/A;  . HERNIA REPAIR  1954   bilateral - inguinal   . HYDROCELE EXCISION Right 01/17/2019   Procedure: HYDROCELECTOMY ADULT;  Surgeon: Abbie Sons, MD;  Location: [facility] ORS;  Service: Urology;  Laterality: Right;  . INTRAOPERATIVE TRANSESOPHAGEAL ECHOCARDIOGRAM N/A 11/15/2013   Procedure: INTRAOPERATIVE TRANSESOPHAGEAL  ECHOCARDIOGRAM;  Surgeon: Gaye Pollack, MD;  Location: [facility] OR;  Service: Open Heart Surgery;  Laterality: N/A;  . LEFT HEART CATHETERIZATION WITH CORONARY ANGIOGRAM N/A 11/02/2013   Procedure: LEFT HEART CATHETERIZATION WITH CORONARY ANGIOGRAM;  Surgeon: Laverda Page, MD;  Location: [facility] CATH LAB;  Service: Cardiovascular;  Laterality: N/A;   Social History   Socioeconomic History  . Marital status: Married    Spouse name: Not on file  . Number of children: 3  . Years of education: Not on file  . Highest education level: Not on file  Occupational History  . Not on file  Tobacco Use  . Smoking status: Former Smoker    Types: Cigars, Pipe  . Smokeless tobacco: Former Systems developer    Quit date: 11/11/1973  . Tobacco comment: quit 50 years  Substance and Sexual Activity  . Alcohol use: Yes    Comment: Rarely- beer, glass of wine   . Drug use: No  . Sexual activity: Yes  Other Topics Concern  . Not on file  Social History Narrative  . Not on file   Social Determinants of Health   Financial Resource Strain:   . Difficulty of Paying Living Expenses: Not on file  Food Insecurity:   . Worried About Charity fundraiser in the Last Year: Not on file  . Ran Out of Food in the Last Year: Not on file  Transportation Needs:   . Lack of Transportation (Medical): Not on file  . Lack  of Transportation (Non-Medical): Not on file  Physical Activity:   . Days of Exercise per Week: Not on file  . Minutes of Exercise per Session: Not on file  Stress:   . Feeling of Stress : Not on file  Social Connections:   . Frequency of Communication with Friends and Family: Not on file  . Frequency of Social Gatherings with Friends and Family: Not on file  . Attends Religious Services: Not on file  . Active Member of Clubs or Organizations: Not on file  . Attends Archivist Meetings: Not on file  . Marital Status: Not on file  Intimate Partner Violence:   . Fear of Current or Ex-Partner: Not on  file  . Emotionally Abused: Not on file  . Physically Abused: Not on file  . Sexually Abused: Not on file   ROS  Review of Systems  Constitution: Negative for chills, decreased appetite, malaise/fatigue and weight gain.  Cardiovascular: Negative for dyspnea on exertion, leg swelling and syncope.  Endocrine: Negative for cold intolerance.  Hematologic/Lymphatic: Does not bruise/bleed easily.  Musculoskeletal: Negative for joint swelling.  Gastrointestinal: Negative for abdominal pain, anorexia, change in bowel habit, hematochezia and melena.  Neurological: Negative for headaches and light-headedness.  Psychiatric/Behavioral: Negative for depression and substance abuse.  All other systems reviewed and are negative.  Objective  Blood pressure (!) 157/86, pulse (!) 56, height 5\' 11"  (1.803 m), weight 157 lb 14.4 oz (71.6 kg), SpO2 99 %.  Vitals with BMI 06/13/2019 06/13/2019 02/15/2019  Height - 5\' 11"  5\' 11"   Weight - 157 lbs 14 oz 154 lbs  BMI - 123XX123 123456  Systolic A999333 123XX123 A999333  Diastolic 86 81 73  Pulse - 56 59     Physical Exam  Constitutional: He appears well-developed and well-nourished. No distress.  HENT:  Head: Atraumatic.  Eyes: Conjunctivae are normal.  Neck: No JVD present. No thyromegaly present.  Cardiovascular: Normal rate, regular rhythm, S1 normal, S2 normal and intact distal pulses. Exam reveals no gallop.  Murmur heard.  Midsystolic murmur is present with a grade of 2/6 radiating to the neck. Pulses:      Carotid pulses are on the right side with bruit and on the left side with bruit. Pulmonary/Chest: Effort normal and breath sounds normal.  Abdominal: Soft. Bowel sounds are normal.  Musculoskeletal:        General: No edema. Normal range of motion.     Cervical back: Neck supple.  Neurological: He is alert.  Skin: Skin is warm and dry.  Psychiatric: He has a normal mood and affect.   Laboratory examination:   No results for input(s): NA, K, CL, CO2,  GLUCOSE, BUN, CREATININE, CALCIUM, GFRNONAA, GFRAA in the last 8760 hours. CrCl cannot be calculated (Patient's most recent lab result is older than the maximum 21 days allowed.).  CMP Latest Ref Rng & Units 10/29/2014 11/18/2013 11/17/2013  Glucose 65 - 99 mg/dL 122(H) 117(H) 102(H)  BUN 6 - 20 mg/dL 21(H) 19 20  Creatinine 0.61 - 1.24 mg/dL 1.04 0.76 0.84  Sodium 135 - 145 mmol/L 143 137 134(L)  Potassium 3.5 - 5.1 mmol/L 3.9 4.1 4.1  Chloride 101 - 111 mmol/L 108 101 98  CO2 22 - 32 mmol/L 30 26 25   Calcium 8.9 - 10.3 mg/dL 8.7(L) 8.4 8.6  Total Protein 6.0 - 8.3 g/dL - - -  Total Bilirubin 0.3 - 1.2 mg/dL - - -  Alkaline Phos 39 - 117 U/L - - -  AST 0 - 37 U/L - - -  ALT 0 - 53 U/L - - -   CBC Latest Ref Rng & Units 02/15/2016 10/29/2014 11/18/2013  WBC 3.8 - 10.6 K/uL 4.7 6.9 7.8  Hemoglobin 13.0 - 18.0 g/dL 12.4(L) 12.8(L) 8.5(L)  Hematocrit 40.0 - 52.0 % 36.5(L) 38.7(L) 24.4(L)  Platelets 150 - 440 K/uL 128(L) 144(L) 89(L)   Lipid Panel     Component Value Date/Time   CHOL 157 11/03/2013 0331   TRIG 113 11/03/2013 0331   HDL 37 (L) 11/03/2013 0331   CHOLHDL 4.2 11/03/2013 0331   VLDL 23 11/03/2013 0331   LDLCALC 97 11/03/2013 0331   HEMOGLOBIN A1C Lab Results  Component Value Date   HGBA1C 5.5 11/11/2013   MPG 111 11/11/2013   TSH No results for input(s): TSH in the last 8760 hours.   Hemoglobin A1CResulted: 06/02/2019 8:59 AM [place] Component Name Value Ref Range  Hemoglobin A1C 5.9 (H) 4.2 - 5.6 %  Average Blood Glucose (Calc) 123    HB./HCT 38.8, platelets 13.7.  Serum glucose 112 mg, potassium 4.2, BUN 22, creatinine 0.9, CMP otherwise normal.  Total cholesterol 116, triglycerides 64, HDL 43, LDL 60.  Medications and allergies  No Known Allergies   Current Outpatient Medications  Medication Instructions  . amLODipine (NORVASC) 5 mg, Oral, Daily  . aspirin 81 mg, Oral, 2 times daily  . atorvastatin (LIPITOR) 10 mg, Oral, Daily  .  fesoterodine (TOVIAZ) 4 mg, Oral, Daily  . finasteride (PROSCAR) 5 mg, Oral, Daily  . olmesartan-hydrochlorothiazide (BENICAR HCT) 20-12.5 MG tablet 1 tablet, Oral, Daily  . tamsulosin (FLOMAX) 0.4 mg, Oral, Daily  . traZODone (DESYREL) 100 mg, Oral, Daily at bedtime  . triamcinolone cream (KENALOG) 0.1 % 1 application, Topical, Daily PRN    Radiology:  No results found.  Cardiac Studies:   Coronary angiogram 11/02/2013: Severe aortic stenosis, no significant coronary artery disease.  Minimal calcification in LAD otherwise normal.  Normal left ventricle systolic function.  Carotid Doppler  06/09/2017: No hemodynamically significant arterial disease in the internal carotid artery bilaterally. There is mild soft plaque in bilateral carotid bulbs. Antegrade right vertebral artery flow. Antegrade left vertebral artery flow.  Echocardiogram 07/20/2018: Left ventricle cavity is normal in size. Moderate concentric hypertrophy of the left ventricle. Normal global wall motion. Normal diastolic filling pattern. Calculated EF 55%. Left atrial cavity is mildly dilated at 4.1 cm. Right atrial cavity is mildly dilated. Bioprosthetic aortic valve with no regurgitation noted. Trace aortic valve stenosis. Aortic valve peak pressure gradient of 18 and mean gradient of 10 mmHg, calculated aortic valve area 1.37 cm. Mild mitral valve leaflet thickening. Mild (Grade I) mitral regurgitation. Mild tricuspid regurgitation. No evidence of pulmonary hypertension. Overall there is no significant change since 12/18/2015.  Assessment     ICD-10-CM   1. H/O aortic valve replacement with porcine valve  Z95.3 EKG 12-Lead   Aortic valve replacement with a 25 mm Edwards magna-Ease 11/15/2013  2. H/O TIA (transient ischemic attack) and stroke 10/23/2013  Z86.73   3. Essential hypertension  I10 amLODipine (NORVASC) 5 MG tablet    DISCONTINUED: amLODipine (NORVASC) 5 MG tablet  4. Pure hypercholesterolemia  E78.00       EKG 06/13/2019: Sinus bradycardia at rate of 52 bpm, normal axis, no evidence of ischemia.  LVH. No significant change from EKG 06/10/2018.  Recommendations:   Meds ordered this encounter  Medications  . DISCONTD: amLODipine (NORVASC) 5 MG tablet  Sig: Take 1 tablet (5 mg total) by mouth daily.    Dispense:  30 tablet    Refill:  2  . amLODipine (NORVASC) 5 MG tablet    Sig: Take 1 tablet (5 mg total) by mouth daily.    Dispense:  30 tablet    Refill:  2    Willie Freeman  is a 73 y.o.  Caucasian male with Aortic Valvular heart disease S/P Bioprosthetic AVR, hyperlipidemia and HTN, H/O NSTEMI on  11/02/2013 and also dysarthria and confusional state and was diagnosed as having TIA. His symptoms resolved, was taken emergently to the cath lab, was found to have no significant coronary artery disease but was found to have critical aortic stenosis. He underwent aortic valve replacement on 11/15/2013. This is his annual visit.   He is presently doing well, essentially remains asymptomatic and it continues to be active.  He still working as a Hotel manager for [employer], continues to have no symptoms of chest pain or shortness of breath.  No change in physical exam.  I reviewed his labs, lipids are well controlled.  Renal function is stable.  Blood pressure is elevated, blood amlodipine 5 g daily, would like to see him back in 6 weeks to get his blood pressure go to at least less than 135 mmHg if not 130 mmHg.  He probably will need 10 mg of amlodipine orally addition of a small dose of diuretics.  I'll set him up to see me or my nurse practitioner in 6 weeks.  If he remains stable we'll continue see him on annual basis.  Adrian Prows, MD, [facility] 06/13/2019, 9:07 AM [place] [facility] [place] Pager: [phone] Office: [phone]

## 2019-06-24 ENCOUNTER — Other Ambulatory Visit: Payer: Self-pay | Admitting: Cardiology

## 2019-06-24 DIAGNOSIS — I10 Essential (primary) hypertension: Secondary | ICD-10-CM

## 2019-07-25 ENCOUNTER — Other Ambulatory Visit: Payer: Self-pay

## 2019-07-25 ENCOUNTER — Ambulatory Visit (INDEPENDENT_AMBULATORY_CARE_PROVIDER_SITE_OTHER): Payer: Medicare HMO | Admitting: Cardiology

## 2019-07-25 ENCOUNTER — Encounter: Payer: Self-pay | Admitting: Cardiology

## 2019-07-25 VITALS — BP 133/70 | HR 62 | Ht 71.0 in | Wt 158.0 lb

## 2019-07-25 DIAGNOSIS — Z953 Presence of xenogenic heart valve: Secondary | ICD-10-CM | POA: Diagnosis not present

## 2019-07-25 DIAGNOSIS — I10 Essential (primary) hypertension: Secondary | ICD-10-CM | POA: Diagnosis not present

## 2019-07-25 DIAGNOSIS — E7800 Pure hypercholesterolemia, unspecified: Secondary | ICD-10-CM

## 2019-07-25 MED ORDER — AMLODIPINE BESYLATE 5 MG PO TABS: 5.0000 mg | ORAL_TABLET | Freq: Every day | ORAL | 3 refills | Status: DC

## 2019-07-25 NOTE — Progress Notes (Signed)
Primary Physician/Referring:  Dion Body, MD  Patient ID: Willie Freeman, male    DOB: [birth year], 74 y.o.   MRN: [id number]  Chief Complaint  Patient presents with  . Hypertension  . Follow-up    6 week   HPI:    Willie Freeman  is a 74 y.o. Caucasian male with Aortic Valvular heart disease S/P Bioprosthetic AVR, hyperlipidemia and HTN, H/O NSTEMI on  11/02/2013 and also dysarthria and confusional state and was diagnosed as having TIA. His symptoms resolved, was taken emergently to the cath lab, was found to have no significant coronary artery disease but was found to have critical aortic stenosis. He underwent aortic valve replacement on 11/15/2013.    Last seen 6 weeks ago for annual visit, blood pressure was elevated.  Started on amlodipine 5 mg and now presents for follow-up.  Doing well.  Blood pressure has been in the 123456 systolic at home.  He is asymptomatic, doing well, no chest pain, dyspnea, TIA like symptoms.  Past Medical History:  Diagnosis Date  . Back pain   . Borderline diabetes mellitus 11/09/2015  . BPH (benign prostatic hypertrophy)   . Dysrhythmia   . Elevated PSA   . Heart murmur   . History of elevated PSA 05/21/2015  . Hydrocele   . Hypertension   . Myocardial infarction ([place])   . Nocturia   . Stroke ([place])    ministroke  . TIA (transient ischemic attack) 11/02/2013  . Urinary frequency    Past Surgical History:  Procedure Laterality Date  . AORTIC VALVE REPLACEMENT N/A 11/15/2013   Procedure: AORTIC VALVE REPLACEMENT (AVR);  Surgeon: Gaye Pollack, MD;  Location: [place];  Service: Open Heart Surgery;  Laterality: N/A;  . COLONOSCOPY WITH PROPOFOL N/A 02/15/2016   Procedure: COLONOSCOPY WITH PROPOFOL;  Surgeon: Lollie Sails, MD;  Location: [facility] ENDOSCOPY;  Service: Endoscopy;  Laterality: N/A;  . HERNIA REPAIR  1954   bilateral - inguinal   . HYDROCELE EXCISION Right 01/17/2019   Procedure: HYDROCELECTOMY ADULT;  Surgeon: Abbie Sons,  MD;  Location: [facility] ORS;  Service: Urology;  Laterality: Right;  . INTRAOPERATIVE TRANSESOPHAGEAL ECHOCARDIOGRAM N/A 11/15/2013   Procedure: INTRAOPERATIVE TRANSESOPHAGEAL ECHOCARDIOGRAM;  Surgeon: Gaye Pollack, MD;  Location: [facility] OR;  Service: Open Heart Surgery;  Laterality: N/A;  . LEFT HEART CATHETERIZATION WITH CORONARY ANGIOGRAM N/A 11/02/2013   Procedure: LEFT HEART CATHETERIZATION WITH CORONARY ANGIOGRAM;  Surgeon: Laverda Page, MD;  Location: [facility] CATH LAB;  Service: Cardiovascular;  Laterality: N/A;   Social History   Socioeconomic History  . Marital status: Married    Spouse name: Not on file  . Number of children: 3  . Years of education: Not on file  . Highest education level: Not on file  Occupational History  . Not on file  Tobacco Use  . Smoking status: Former Smoker    Types: Cigars, Pipe  . Smokeless tobacco: Former Systems developer    Quit date: 11/11/1973  . Tobacco comment: quit 50 years  Substance and Sexual Activity  . Alcohol use: Yes    Comment: Rarely- beer, glass of wine   . Drug use: No  . Sexual activity: Yes  Other Topics Concern  . Not on file  Social History Narrative  . Not on file   Social Determinants of Health   Financial Resource Strain:   . Difficulty of Paying Living Expenses: Not on file  Food Insecurity:   . Worried About Estate manager/land agent  of Food in the Last Year: Not on file  . Ran Out of Food in the Last Year: Not on file  Transportation Needs:   . Lack of Transportation (Medical): Not on file  . Lack of Transportation (Non-Medical): Not on file  Physical Activity:   . Days of Exercise per Week: Not on file  . Minutes of Exercise per Session: Not on file  Stress:   . Feeling of Stress : Not on file  Social Connections:   . Frequency of Communication with Friends and Family: Not on file  . Frequency of Social Gatherings with Friends and Family: Not on file  . Attends Religious Services: Not on file  . Active Member of Clubs or  Organizations: Not on file  . Attends Archivist Meetings: Not on file  . Marital Status: Not on file  Intimate Partner Violence:   . Fear of Current or Ex-Partner: Not on file  . Emotionally Abused: Not on file  . Physically Abused: Not on file  . Sexually Abused: Not on file   ROS  Review of Systems  Constitution: Negative for chills, decreased appetite, malaise/fatigue and weight gain.  Cardiovascular: Negative for dyspnea on exertion, leg swelling and syncope.  Endocrine: Negative for cold intolerance.  Hematologic/Lymphatic: Does not bruise/bleed easily.  Musculoskeletal: Negative for joint swelling.  Gastrointestinal: Negative for abdominal pain, anorexia, change in bowel habit, hematochezia and melena.  Neurological: Negative for headaches and light-headedness.  Psychiatric/Behavioral: Negative for depression and substance abuse.  All other systems reviewed and are negative.  Objective  Blood pressure 133/70, pulse 62, height 5\' 11"  (1.803 m), weight 158 lb (71.7 kg), SpO2 98 %.  Vitals with BMI 07/25/2019 06/13/2019 06/13/2019  Height 5\' 11"  - 5\' 11"   Weight 158 lbs - 157 lbs 14 oz  BMI 0000000 - 123XX123  Systolic Q000111Q A999333 123XX123  Diastolic 70 86 81  Pulse 62 - 56     Physical Exam  Constitutional: He appears well-developed and well-nourished. No distress.  HENT:  Head: Atraumatic.  Eyes: Conjunctivae are normal.  Neck: No JVD present. No thyromegaly present.  Cardiovascular: Normal rate, regular rhythm, S1 normal, S2 normal and intact distal pulses. Exam reveals no gallop.  Murmur heard.  Midsystolic murmur is present with a grade of 2/6 radiating to the neck. Pulses:      Carotid pulses are on the right side with bruit and on the left side with bruit. Pulmonary/Chest: Effort normal and breath sounds normal.  Abdominal: Soft. Bowel sounds are normal.  Musculoskeletal:        General: No edema. Normal range of motion.     Cervical back: Neck supple.   Neurological: He is alert.  Skin: Skin is warm and dry.  Psychiatric: He has a normal mood and affect.   Laboratory examination:   No results for input(s): NA, K, CL, CO2, GLUCOSE, BUN, CREATININE, CALCIUM, GFRNONAA, GFRAA in the last 8760 hours. CrCl cannot be calculated (Patient's most recent lab result is older than the maximum 21 days allowed.).  CMP Latest Ref Rng & Units 10/29/2014 11/18/2013 11/17/2013  Glucose 65 - 99 mg/dL 122(H) 117(H) 102(H)  BUN 6 - 20 mg/dL 21(H) 19 20  Creatinine 0.61 - 1.24 mg/dL 1.04 0.76 0.84  Sodium 135 - 145 mmol/L 143 137 134(L)  Potassium 3.5 - 5.1 mmol/L 3.9 4.1 4.1  Chloride 101 - 111 mmol/L 108 101 98  CO2 22 - 32 mmol/L 30 26 25   Calcium 8.9 - 10.3  mg/dL 8.7(L) 8.4 8.6  Total Protein 6.0 - 8.3 g/dL - - -  Total Bilirubin 0.3 - 1.2 mg/dL - - -  Alkaline Phos 39 - 117 U/L - - -  AST 0 - 37 U/L - - -  ALT 0 - 53 U/L - - -   CBC Latest Ref Rng & Units 02/15/2016 10/29/2014 11/18/2013  WBC 3.8 - 10.6 K/uL 4.7 6.9 7.8  Hemoglobin 13.0 - 18.0 g/dL 12.4(L) 12.8(L) 8.5(L)  Hematocrit 40.0 - 52.0 % 36.5(L) 38.7(L) 24.4(L)  Platelets 150 - 440 K/uL 128(L) 144(L) 89(L)   Lipid Panel     Component Value Date/Time   CHOL 157 11/03/2013 0331   TRIG 113 11/03/2013 0331   HDL 37 (L) 11/03/2013 0331   CHOLHDL 4.2 11/03/2013 0331   VLDL 23 11/03/2013 0331   LDLCALC 97 11/03/2013 0331   HEMOGLOBIN A1C Lab Results  Component Value Date   HGBA1C 5.5 11/11/2013   MPG 111 11/11/2013   TSH No results for input(s): TSH in the last 8760 hours.   Hemoglobin A1CResulted: 06/02/2019 8:59 AM [place] Component Name Value Ref Range  Hemoglobin A1C 5.9 (H) 4.2 - 5.6 %  Average Blood Glucose (Calc) 123    HB./HCT 38.8, platelets 13.7.  Serum glucose 112 mg, potassium 4.2, BUN 22, creatinine 0.9, CMP otherwise normal.  Total cholesterol 116, triglycerides 64, HDL 43, LDL 60.  Medications and allergies  No Known Allergies   Current  Outpatient Medications  Medication Instructions  . amLODipine (NORVASC) 5 mg, Oral, Daily  . aspirin 81 mg, Oral, 2 times daily  . atorvastatin (LIPITOR) 10 MG tablet TAKE 1 TABLET BY MOUTH  DAILY  . fesoterodine (TOVIAZ) 4 mg, Oral, Daily  . finasteride (PROSCAR) 5 mg, Oral, Daily  . olmesartan-hydrochlorothiazide (BENICAR HCT) 20-12.5 MG tablet 1 tablet, Oral, Daily  . tamsulosin (FLOMAX) 0.4 mg, Oral, Daily  . traZODone (DESYREL) 100 mg, Oral, Daily at bedtime  . triamcinolone cream (KENALOG) 0.1 % 1 application, Topical, Daily PRN    Radiology:  No results found.  Cardiac Studies:   Coronary angiogram 11/02/2013: Severe aortic stenosis, no significant coronary artery disease.  Minimal calcification in LAD otherwise normal.  Normal left ventricle systolic function.  Carotid Doppler  06/09/2017: No hemodynamically significant arterial disease in the internal carotid artery bilaterally. There is mild soft plaque in bilateral carotid bulbs. Antegrade right vertebral artery flow. Antegrade left vertebral artery flow.  Echocardiogram 07/20/2018: Left ventricle cavity is normal in size. Moderate concentric hypertrophy of the left ventricle. Normal global wall motion. Normal diastolic filling pattern. Calculated EF 55%. Left atrial cavity is mildly dilated at 4.1 cm. Right atrial cavity is mildly dilated. Bioprosthetic aortic valve with no regurgitation noted. Trace aortic valve stenosis. Aortic valve peak pressure gradient of 18 and mean gradient of 10 mmHg, calculated aortic valve area 1.37 cm. Mild mitral valve leaflet thickening. Mild (Grade I) mitral regurgitation. Mild tricuspid regurgitation. No evidence of pulmonary hypertension. Overall there is no significant change since 12/18/2015.  Assessment     ICD-10-CM   1. Essential hypertension  I10 amLODipine (NORVASC) 5 MG tablet  2. H/O aortic valve replacement with porcine valve  Z95.3   3. Pure hypercholesterolemia  E78.00      EKG 06/13/2019: Sinus bradycardia at rate of 52 bpm, normal axis, no evidence of ischemia.  LVH. No significant change from EKG 06/10/2018.  Recommendations:   Meds ordered this encounter  Medications  . amLODipine (NORVASC) 5 MG  tablet    Sig: Take 1 tablet (5 mg total) by mouth daily.    Dispense:  90 tablet    Refill:  3    Order Specific Question:   Supervising Provider    Answer:   Adrian Prows [[id number]]    Willie Freeman  is a 74 y.o.  Caucasian male with Aortic Valvular heart disease S/P Bioprosthetic AVR, hyperlipidemia and HTN, H/O NSTEMI on  11/02/2013 and also dysarthria and confusional state and was diagnosed as having TIA. His symptoms resolved, was taken emergently to the cath lab, was found to have no significant coronary artery disease but was found to have critical aortic stenosis. He underwent aortic valve replacement on 11/15/2013.   Blood pressure is much better controlled with addition of amlodipine and he is tolerating this well.  We will continue the same.  No new symptoms or complaints.  His lipids and recent lab results were discussed at his last office visit and in range.  We will continue with annual follow-ups, but encouraged him to contact us sooner if needed.  Miquel Dunn, MSN, APRN, FNP-C [facility]. [place] Office: [phone] Fax: [phone]

## 2019-08-22 ENCOUNTER — Other Ambulatory Visit: Payer: Self-pay

## 2019-08-22 DIAGNOSIS — I10 Essential (primary) hypertension: Secondary | ICD-10-CM

## 2019-08-22 MED ORDER — OLMESARTAN MEDOXOMIL-HCTZ 20-12.5 MG PO TABS: 1.0000 | ORAL_TABLET | Freq: Every day | ORAL | 0 refills | Status: DC

## 2019-11-14 ENCOUNTER — Other Ambulatory Visit: Payer: Self-pay

## 2019-11-14 DIAGNOSIS — E7800 Pure hypercholesterolemia, unspecified: Secondary | ICD-10-CM

## 2019-11-14 MED ORDER — ATORVASTATIN CALCIUM 10 MG PO TABS: 10.0000 mg | ORAL_TABLET | Freq: Every day | ORAL | 3 refills | Status: DC

## 2019-12-02 DIAGNOSIS — E7800 Pure hypercholesterolemia, unspecified: Secondary | ICD-10-CM | POA: Diagnosis not present

## 2019-12-02 DIAGNOSIS — Z862 Personal history of diseases of the blood and blood-forming organs and certain disorders involving the immune mechanism: Secondary | ICD-10-CM | POA: Diagnosis not present

## 2019-12-02 DIAGNOSIS — R7303 Prediabetes: Secondary | ICD-10-CM | POA: Diagnosis not present

## 2019-12-19 DIAGNOSIS — D696 Thrombocytopenia, unspecified: Secondary | ICD-10-CM | POA: Diagnosis not present

## 2019-12-19 DIAGNOSIS — D649 Anemia, unspecified: Secondary | ICD-10-CM | POA: Diagnosis not present

## 2019-12-19 DIAGNOSIS — I10 Essential (primary) hypertension: Secondary | ICD-10-CM | POA: Diagnosis not present

## 2019-12-19 DIAGNOSIS — Z0000 Encounter for general adult medical examination without abnormal findings: Secondary | ICD-10-CM | POA: Diagnosis not present

## 2019-12-19 DIAGNOSIS — R011 Cardiac murmur, unspecified: Secondary | ICD-10-CM | POA: Diagnosis not present

## 2019-12-19 DIAGNOSIS — Z87891 Personal history of nicotine dependence: Secondary | ICD-10-CM | POA: Diagnosis not present

## 2019-12-19 DIAGNOSIS — E785 Hyperlipidemia, unspecified: Secondary | ICD-10-CM | POA: Diagnosis not present

## 2019-12-19 DIAGNOSIS — R7303 Prediabetes: Secondary | ICD-10-CM | POA: Diagnosis not present

## 2020-04-19 DIAGNOSIS — H6121 Impacted cerumen, right ear: Secondary | ICD-10-CM | POA: Diagnosis not present

## 2020-05-23 ENCOUNTER — Encounter: Payer: Self-pay | Admitting: Emergency Medicine

## 2020-05-23 ENCOUNTER — Other Ambulatory Visit: Payer: Self-pay

## 2020-05-23 ENCOUNTER — Emergency Department: Payer: Medicare HMO

## 2020-05-23 DIAGNOSIS — Z7982 Long term (current) use of aspirin: Secondary | ICD-10-CM | POA: Diagnosis not present

## 2020-05-23 DIAGNOSIS — I10 Essential (primary) hypertension: Secondary | ICD-10-CM | POA: Insufficient documentation

## 2020-05-23 DIAGNOSIS — I714 Abdominal aortic aneurysm, without rupture: Secondary | ICD-10-CM | POA: Insufficient documentation

## 2020-05-23 DIAGNOSIS — K2980 Duodenitis without bleeding: Secondary | ICD-10-CM | POA: Diagnosis not present

## 2020-05-23 DIAGNOSIS — Z79899 Other long term (current) drug therapy: Secondary | ICD-10-CM | POA: Diagnosis not present

## 2020-05-23 DIAGNOSIS — N400 Enlarged prostate without lower urinary tract symptoms: Secondary | ICD-10-CM | POA: Diagnosis not present

## 2020-05-23 DIAGNOSIS — R079 Chest pain, unspecified: Secondary | ICD-10-CM | POA: Diagnosis not present

## 2020-05-23 DIAGNOSIS — Z87891 Personal history of nicotine dependence: Secondary | ICD-10-CM | POA: Insufficient documentation

## 2020-05-23 DIAGNOSIS — R109 Unspecified abdominal pain: Secondary | ICD-10-CM | POA: Diagnosis not present

## 2020-05-23 DIAGNOSIS — R1084 Generalized abdominal pain: Secondary | ICD-10-CM | POA: Diagnosis not present

## 2020-05-23 DIAGNOSIS — Z8673 Personal history of transient ischemic attack (TIA), and cerebral infarction without residual deficits: Secondary | ICD-10-CM | POA: Diagnosis not present

## 2020-05-23 DIAGNOSIS — R0602 Shortness of breath: Secondary | ICD-10-CM | POA: Diagnosis not present

## 2020-05-23 DIAGNOSIS — R319 Hematuria, unspecified: Secondary | ICD-10-CM | POA: Diagnosis not present

## 2020-05-23 LAB — CBC
HCT: 37.8 % — ABNORMAL LOW (ref 39.0–52.0)
Hemoglobin: 12.5 g/dL — ABNORMAL LOW (ref 13.0–17.0)
MCH: 29.8 pg (ref 26.0–34.0)
MCHC: 33.1 g/dL (ref 30.0–36.0)
MCV: 90.2 fL (ref 80.0–100.0)
Platelets: 150 10*3/uL (ref 150–400)
RBC: 4.19 MIL/uL — ABNORMAL LOW (ref 4.22–5.81)
RDW: 13.6 % (ref 11.5–15.5)
WBC: 6.9 10*3/uL (ref 4.0–10.5)
nRBC: 0 % (ref 0.0–0.2)

## 2020-05-23 LAB — COMPREHENSIVE METABOLIC PANEL
ALT: 17 U/L (ref 0–44)
AST: 18 U/L (ref 15–41)
Albumin: 3.7 g/dL (ref 3.5–5.0)
Alkaline Phosphatase: 72 U/L (ref 38–126)
Anion gap: 9 (ref 5–15)
BUN: 21 mg/dL (ref 8–23)
CO2: 28 mmol/L (ref 22–32)
Calcium: 8.9 mg/dL (ref 8.9–10.3)
Chloride: 101 mmol/L (ref 98–111)
Creatinine, Ser: 0.89 mg/dL (ref 0.61–1.24)
GFR, Estimated: >60 mL/min (ref >60)
Glucose, Bld: 120 mg/dL — ABNORMAL HIGH (ref 70–99)
Potassium: 3.7 mmol/L (ref 3.5–5.1)
Sodium: 138 mmol/L (ref 135–145)
Total Bilirubin: 1.2 mg/dL (ref 0.3–1.2)
Total Protein: 6.8 g/dL (ref 6.5–8.1)

## 2020-05-23 LAB — URINALYSIS, COMPLETE (UACMP) WITH MICROSCOPIC
Bacteria, UA: NONE SEEN
Bilirubin Urine: NEGATIVE
Glucose, UA: NEGATIVE mg/dL
Ketones, ur: 20 mg/dL — AB
Leukocytes,Ua: NEGATIVE
Nitrite: NEGATIVE
Protein, ur: NEGATIVE mg/dL
Specific Gravity, Urine: 1.023 (ref 1.005–1.030)
Squamous Epithelial / HPF: NONE SEEN (ref 0–5)
pH: 6 (ref 5.0–8.0)

## 2020-05-23 LAB — LIPASE, BLOOD: Lipase: 22 U/L (ref 11–51)

## 2020-05-23 LAB — TROPONIN I (HIGH SENSITIVITY): Troponin I (High Sensitivity): 17 ng/L (ref <18)

## 2020-05-23 NOTE — ED Triage Notes (Signed)
Pt to ED from home c/o upper mid abd cramp when he woke up around 0600 today, denies n/v/d, denies urinary changes.  Pt A&Ox4, chest rise even and unlabored, skin WNL, in NAD at this time.  Hx of MI and heart valve replacement.

## 2020-05-24 ENCOUNTER — Emergency Department
Admission: EM | Admit: 2020-05-24 | Discharge: 2020-05-24 | Disposition: A | Discharge status: Home / Self Care | Payer: Medicare HMO | Attending: Emergency Medicine | Admitting: Emergency Medicine

## 2020-05-24 ENCOUNTER — Emergency Department: Payer: Medicare HMO

## 2020-05-24 ENCOUNTER — Telehealth: Payer: Self-pay | Admitting: Cardiology

## 2020-05-24 DIAGNOSIS — R319 Hematuria, unspecified: Secondary | ICD-10-CM

## 2020-05-24 DIAGNOSIS — R1084 Generalized abdominal pain: Secondary | ICD-10-CM

## 2020-05-24 DIAGNOSIS — R109 Unspecified abdominal pain: Secondary | ICD-10-CM | POA: Diagnosis not present

## 2020-05-24 DIAGNOSIS — I714 Abdominal aortic aneurysm, without rupture, unspecified: Secondary | ICD-10-CM

## 2020-05-24 DIAGNOSIS — K2980 Duodenitis without bleeding: Secondary | ICD-10-CM

## 2020-05-24 DIAGNOSIS — N400 Enlarged prostate without lower urinary tract symptoms: Secondary | ICD-10-CM

## 2020-05-24 LAB — TROPONIN I (HIGH SENSITIVITY): Troponin I (High Sensitivity): 15 ng/L (ref <18)

## 2020-05-24 MED ORDER — IOHEXOL 300 MG/ML  SOLN
100.0000 mL | Freq: Once | INTRAMUSCULAR | Status: AC | PRN
  Administered 2020-05-24: 100 mL via INTRAVENOUS

## 2020-05-24 NOTE — ED Provider Notes (Signed)
[facility] Emergency Department Provider Note  ____________________________________________   First MD Initiated Contact with Patient 05/24/20 [phone]     (approximate)  I have reviewed the triage vital signs and the nursing notes.   HISTORY  Chief Complaint Abdominal Pain   HPI Willie Freeman is a 74 y.o. male with a past medical history of HTN, CVA, BPH, borderline diabetes, and aortic stenosis status AVR who presents for assessment of some abdominal pain that started around 6 AM today.  Patient states this lasted several hours but resolved a couple of hours ago while he was in the emergency room.  No prior similar episodes or clear alleviating aggravating or precipitating events.  Patient denies any twisting heavy lifting or other clear motion for symptoms started.  He denies any current pain and denies any recent headache, earache, sore throat, fevers, chills, cough, shortness of breath, nausea, vomiting, diarrhea, dysuria, blood in stool, dysuria, rash or extremity pain.  Denies any back pain.  Denies any EtOH use.  He is only on aspirin and not on any other blood thinners as far as he knows.         Past Medical History:  Diagnosis Date  . Back pain   . Borderline diabetes mellitus 11/09/2015  . BPH (benign prostatic hypertrophy)   . Dysrhythmia   . Elevated PSA   . Heart murmur   . History of elevated PSA 05/21/2015  . Hydrocele   . Hypertension   . Myocardial infarction ([place])   . Nocturia   . Stroke ([place])    ministroke  . TIA (transient ischemic attack) 11/02/2013  . Urinary frequency     Patient Active Problem List   Diagnosis Date Noted  . Essential hypertension 12/30/2017  . Thrombocytopenia ([place]) 05/20/2017  . Pure hypercholesterolemia 02/10/2017  . History of normocytic normochromic anemia 11/09/2015  . Borderline diabetes mellitus 11/09/2015  . Vaccine counseling 11/08/2015  . BPH with obstruction/lower urinary tract symptoms  05/21/2015  . History of elevated PSA 05/21/2015  . Primary insomnia 09/04/2014  . S/P AVR 11/15/2013  . TIA (transient ischemic attack) 11/02/2013    Past Surgical History:  Procedure Laterality Date  . AORTIC VALVE REPLACEMENT N/A 11/15/2013   Procedure: AORTIC VALVE REPLACEMENT (AVR);  Surgeon: Gaye Pollack, MD;  Location: [facility];  Service: Open Heart Surgery;  Laterality: N/A;  . COLONOSCOPY WITH PROPOFOL N/A 02/15/2016   Procedure: COLONOSCOPY WITH PROPOFOL;  Surgeon: Lollie Sails, MD;  Location: [facility] ENDOSCOPY;  Service: Endoscopy;  Laterality: N/A;  . HERNIA REPAIR  1954   bilateral - inguinal   . HYDROCELE EXCISION Right 01/17/2019   Procedure: HYDROCELECTOMY ADULT;  Surgeon: Abbie Sons, MD;  Location: [facility] ORS;  Service: Urology;  Laterality: Right;  . INTRAOPERATIVE TRANSESOPHAGEAL ECHOCARDIOGRAM N/A 11/15/2013   Procedure: INTRAOPERATIVE TRANSESOPHAGEAL ECHOCARDIOGRAM;  Surgeon: Gaye Pollack, MD;  Location: [facility] OR;  Service: Open Heart Surgery;  Laterality: N/A;  . LEFT HEART CATHETERIZATION WITH CORONARY ANGIOGRAM N/A 11/02/2013   Procedure: LEFT HEART CATHETERIZATION WITH CORONARY ANGIOGRAM;  Surgeon: Laverda Page, MD;  Location: [facility] CATH LAB;  Service: Cardiovascular;  Laterality: N/A;    Prior to Admission medications   Medication Sig Start Date End Date Taking? Authorizing Provider  amLODipine (NORVASC) 5 MG tablet Take 1 tablet (5 mg total) by mouth daily. 07/25/19 10/23/19  Miquel Dunn, NP  aspirin 81 MG chewable tablet Chew 81 mg by mouth 2 (two) times daily.  [provider]  atorvastatin (LIPITOR) 10 MG tablet Take 1 tablet (10 mg total) by mouth daily. 11/14/19   Adrian Prows, MD  fesoterodine (TOVIAZ) 4 MG TB24 tablet Take 1 tablet (4 mg total) by mouth daily. 12/29/18   McGowan, Larene Beach A, PA-C  finasteride (PROSCAR) 5 MG tablet TAKE 1 TABLET (5 MG TOTAL) BY MOUTH DAILY. 10/21/17   McGowan, Hunt Oris, PA-C  olmesartan-hydrochlorothiazide  (BENICAR HCT) 20-12.5 MG tablet Take 1 tablet by mouth daily. 08/22/19   Adrian Prows, MD  tamsulosin (FLOMAX) 0.4 MG CAPS capsule Take 1 capsule (0.4 mg total) by mouth daily. 12/16/17   Zara Council A, PA-C  traZODone (DESYREL) 50 MG tablet Take 100 mg by mouth at bedtime. 12/31/18   [provider]  triamcinolone cream (KENALOG) 0.1 % Apply 1 application topically daily as needed (itching).     [provider]    Allergies Patient has no known allergies.  Family History  Problem Relation Age of Onset  . Prostate cancer Father   . Heart attack Mother   . Hypertension Brother   . Coronary artery disease Brother   . Kidney disease Neg Hx   . Kidney cancer Neg Hx   . Bladder Cancer Neg Hx     Social History Social History   Tobacco Use  . Smoking status: Former Smoker    Types: Cigars, Pipe  . Smokeless tobacco: Former Systems developer    Quit date: 11/11/1973  . Tobacco comment: quit 50 years  Vaping Use  . Vaping Use: Never used  Substance Use Topics  . Alcohol use: Yes    Comment: Rarely- beer, glass of wine   . Drug use: No    Review of Systems  Review of Systems  Constitutional: Negative for chills and fever.  HENT: Negative for sore throat.   Eyes: Negative for pain.  Respiratory: Negative for cough and stridor.   Cardiovascular: Negative for chest pain.  Gastrointestinal: Positive for abdominal pain. Negative for vomiting.  Genitourinary: Negative for dysuria.  Musculoskeletal: Negative for myalgias.  Skin: Negative for rash.  Neurological: Negative for seizures, loss of consciousness and headaches.  Psychiatric/Behavioral: Negative for suicidal ideas.  All other systems reviewed and are negative.     ____________________________________________   PHYSICAL EXAM:  VITAL SIGNS: ED Triage Vitals  Enc Vitals Group     BP 05/23/20 2237 (!) 155/101     Pulse Rate 05/23/20 2237 69     Resp 05/23/20 2237 14     Temp 05/23/20 2237 98.7 F (37.1 C)      Temp Source 05/23/20 2237 Oral     SpO2 05/23/20 2237 98 %     Weight 05/23/20 2238 150 lb (68 kg)     Height 05/23/20 2238 5\' 11"  (1.803 m)     Head Circumference --      Peak Flow --      Pain Score 05/23/20 2238 5     Pain Loc --      Pain Edu? --      Excl. in [place]? --    Vitals:   05/24/20 0237 05/24/20 0552  BP: 126/67 (!) 142/83  Pulse: 83 79  Resp: 16 18  Temp:    SpO2: 95% 99%   Physical Exam Vitals and nursing note reviewed.  Constitutional:      Appearance: He is well-developed.  HENT:     Head: Normocephalic and atraumatic.     Right Ear: External ear normal.  Left Ear: External ear normal.     Nose: Nose normal.  Eyes:     Conjunctiva/sclera: Conjunctivae normal.  Cardiovascular:     Rate and Rhythm: Normal rate and regular rhythm.     Heart sounds: No murmur heard.   Pulmonary:     Effort: Pulmonary effort is normal. No respiratory distress.     Breath sounds: Normal breath sounds.  Abdominal:     Palpations: Abdomen is soft.     Tenderness: There is no abdominal tenderness. There is no right CVA tenderness or left CVA tenderness.  Musculoskeletal:     Cervical back: Neck supple.  Skin:    General: Skin is warm and dry.     Capillary Refill: Capillary refill takes less than 2 seconds.  Neurological:     Mental Status: He is alert and oriented to person, place, and time.  Psychiatric:        Mood and Affect: Mood normal.      ____________________________________________   LABS (all labs ordered are listed, but only abnormal results are displayed)  Labs Reviewed  COMPREHENSIVE METABOLIC PANEL - Abnormal; Notable for the following components:      Result Value   Glucose, Bld 120 (*)    All other components within normal limits  CBC - Abnormal; Notable for the following components:   RBC 4.19 (*)    Hemoglobin 12.5 (*)    HCT 37.8 (*)    All other components within normal limits  URINALYSIS, COMPLETE (UACMP) WITH MICROSCOPIC - Abnormal;  Notable for the following components:   Color, Urine YELLOW (*)    APPearance CLEAR (*)    Hgb urine dipstick SMALL (*)    Ketones, ur 20 (*)    All other components within normal limits  LIPASE, BLOOD  TROPONIN I (HIGH SENSITIVITY)  TROPONIN I (HIGH SENSITIVITY)   ____________________________________________  EKG  Sinus rhythm with a ventricular rate of 61, normal axis, unremarkable intervals, nonspecific change in aVL without other clear evidence of acute ischemia or other significant underlying arrhythmia. ____________________________________________  RADIOLOGY  ED MD interpretation: Chest x-ray shows no evidence of acute focal consolidation, large effusion, overt edema, cardiomediastinum, or other acute thoracic process.  Official radiology report(s): DG Chest 2 View  Result Date: 05/23/2020 CLINICAL DATA:  Shortness of breath and chest pain EXAM: CHEST - 2 VIEW COMPARISON:  12/28/2013 FINDINGS: Cardiac shadow is within normal limits. Postsurgical changes are again seen and stable. Aortic calcifications are noted. The lungs are well aerated bilaterally filtrate or sizable effusion is seen. Degenerative changes of the thoracic spine are noted. IMPRESSION: No acute abnormality noted. Electronically Signed   By: Inez Catalina M.D.   On: 05/23/2020 23:05   CT ABDOMEN PELVIS W CONTRAST  Result Date: 05/24/2020 CLINICAL DATA:  Mid abdominal pain EXAM: CT ABDOMEN AND PELVIS WITH CONTRAST TECHNIQUE: Multidetector CT imaging of the abdomen and pelvis was performed using the standard protocol following bolus administration of intravenous contrast. CONTRAST:  145mL OMNIPAQUE IOHEXOL 300 MG/ML  SOLN COMPARISON:  None. FINDINGS: Lower chest: The visualized lung bases are clear. Aortic valve replacement has been performed. Moderate multi-vessel coronary artery calcification. Cardiac size is at the upper limits of normal. No pericardial effusion. Hepatobiliary: Tiny cyst within the right hepatic  lobe. Liver otherwise unremarkable. Gallbladder unremarkable. No intra or extrahepatic biliary ductal dilation. Pancreas: Unremarkable Spleen: Unremarkable Adrenals/Urinary Tract: The adrenal glands are unremarkable. The kidneys are normal. The bladder is mildly thick walled and there is intended a  shin of the bladder base by the enlarged central prostate suggesting changes of mild bladder outlet obstruction secondary to prostatic hypertrophy. The bladder is mildly distended. Stomach/Bowel: There are mild Peri duodenal inflammatory changes surrounding the juncture of the second and third portion of the duodenum, best seen on coronal image # 35 and axial image # 51 suggesting changes of a mild duodenitis. The stomach, small bowel, and large bowel are otherwise unremarkable save for moderate sigmoid diverticulosis. The appendix is unremarkable. Vascular/Lymphatic: There is a fusiform infrarenal abdominal aortic aneurysm measuring 3.0 x 2.8 cm at axial image # 47. 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 This pseudo aneurysmal component measures 9 mm x 15 mm and is best appreciated on sagittal image # 65. Moderate aortoiliac atherosclerotic calcification. Particularly prominent atherosclerotic calcification noted at the origin of the left renal artery. No pathologic adenopathy within the abdomen and pelvis. Reproductive: Moderate prostatic enlargement. Seminal vesicles are unremarkable. Other: Moderate right fat containing indirect inguinal hernia. Small rectocele extending into the right posterolateral perirectal soft tissues. Musculoskeletal: No acute bone abnormality. Degenerative changes are seen within the lumbar spine. IMPRESSION: Mild Peri duodenal inflammatory change involving the second and third portion of the duodenum suggesting changes of a mild duodenitis. No evidence of  obstruction or perforation. Fusiform 3.0 cm infrarenal abdominal aortic aneurysm. 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 Given its size, vascular consultation is recommended for further management. Moderate prostatic enlargement. Mild bladder wall thickening suggest changes of a mild bladder outlet obstruction. Moderate fat containing right indirect inguinal hernia. Aortic aneurysm NOS (ICD10-I71.9). Aortic Atherosclerosis (ICD10-I70.0). Electronically Signed   By: Fidela Salisbury MD   On: 05/24/2020 05:54    ____________________________________________   PROCEDURES  Procedure(s) performed (including Critical Care):  Procedures   ____________________________________________   INITIAL IMPRESSION / ASSESSMENT AND PLAN / ED COURSE      Patient presents with left to history exam for assessment of some generalized abdominal pain that he suspected was some cramps that started earlier this morning around 6 AM and resolved couple hours prior to my assessment.  On arrival he is afebrile and hemodynamically stable  Differential includes but is not limited to acute cholecystitis, pancreatitis, kidney stone, cystitis, diverticulitis, SBO, muscle wall cramp, and gastritis.  Very low suspicion for ACS given reassuring EKG with patient denying any chest pain and to nonelevated troponins obtained over 2 hours.  Lipase of 22 is not consistent with acute pancreatitis.  CMP obtained shows no significant left-sided metabolic derangements.  Specifically no evidence of cholestasis and given no focal tenderness right upper quadrant low suspicion for acute cholecystitis or other biliary pathology at this time.  CBC is unremarkable.  UA shows small blood and 20 ketones but no evidence of infection.  Low suspicion for pyelonephritis.  CT shows no evidence of stone, appendicitis, diverticulitis, or other clear acute intra-abdominal  process aside from some very mild duodenitis.  Did discuss with patient multiple incidental findings today including ulcerated aneurysm which will require vascular follow-up as well as some blood in his urine and duodenitis on his CT which should be followed up by his PCP.  Unclear etiology of his duodenitis although low suspicion for ischemia or acute infectious process at this time given patient is currently asymptomatic and denies any fevers vomiting diarrhea or other acute sick symptoms.  Given  stable vital signs with otherwise reassuring exam and work-up with patient safe for discharge with plan for outpatient PCP and vascular surgery follow-up.  Discharge stable condition.  Strict return precautions advised discussed.   ____________________________________________   FINAL CLINICAL IMPRESSION(S) / ED DIAGNOSES  Final diagnoses:  Generalized abdominal pain  Duodenitis  Abdominal aortic aneurysm (AAA) without rupture (HCC)  Hematuria, unspecified type  Benign prostatic hyperplasia, unspecified whether lower urinary tract symptoms present    Medications  iohexol (OMNIPAQUE) 300 MG/ML solution 100 mL (100 mLs Intravenous Contrast Given 05/24/20 0516)     ED Discharge Orders    None       Note:  This document was prepared using Dragon voice recognition software and may include unintentional dictation errors.   Lucrezia Starch, MD 05/24/20 [phone]

## 2020-05-24 NOTE — ED Notes (Signed)
RN introduced self to patient and attempted to conduct assessment. Patient was also informed of need for IV  In order to receive contrast with ordered abdominal CT. Patient began arguing with RN stating that the CT "should have been done hours ago and that you haven't done anything for me or my pain." RN explained to patient that lab work, urine, and xray were completed while awaiting room in triage area, and that with all results being stable the MD had opted to continue with an abdominal CT. Patient was again informed that CT cannot be completed without IV due to need for IV contrast and that patient's cannot have an IV while in the waiting area and placement was contingent upon being placed in a bed in the back. Pt declines to listen to RN and continually interrupts as RN was speaking. MD came to the bedside and again attempted to explain and apologize to the patient. Pt begins stating "I came here for my stomach cramps and the cramps are gone!" MD and RN inquired as to whether or not the patient wants to move forward with the CT and explains that pt has the option to leave AMA should he not choose to complete the workup. Patient responds to this by saying "I came here to figure out my stomach cramps!" RN asked patient to decide whether or not he would like the CT as stating the prior is not an answer to the question as patient is still not providing his arm for IV access. Pt then exclaims "well just do it then, you could have already been done!" and provides his arm. IV access obtained and charted. MD and Charge RN made aware of patient interaction.

## 2020-05-24 NOTE — ED Notes (Signed)
Patient transported to CT with technician in wheelchair   

## 2020-05-24 NOTE — Discharge Instructions (Signed)
Your CT Abdomen Pelvis Showed: Mild Peri duodenal inflammatory change involving the second and third portion of the duodenum suggesting changes of a mild duodenitis. No evidence of obstruction or perforation.   Fusiform 3.0 cm infrarenal abdominal aortic aneurysm. 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 Given its size, vascular consultation is recommended for further management.   Moderate prostatic enlargement. Mild bladder wall thickening suggest changes of a mild bladder outlet obstruction.   Moderate fat containing right indirect inguinal hernia.   Aortic aneurysm NOS (ICD10-I71.9). Aortic Atherosclerosis (ICD10-I70.0).

## 2020-05-24 NOTE — Telephone Encounter (Signed)
Please schedule OV with me in 1 month

## 2020-05-28 NOTE — Telephone Encounter (Signed)
I have left message on voicemail to call back to schedule an appt

## 2020-05-30 DIAGNOSIS — I714 Abdominal aortic aneurysm, without rupture: Secondary | ICD-10-CM | POA: Diagnosis not present

## 2020-05-30 NOTE — Telephone Encounter (Signed)
Complete

## 2020-06-04 ENCOUNTER — Ambulatory Visit: Payer: Medicare HMO | Admitting: Cardiology

## 2020-06-04 ENCOUNTER — Other Ambulatory Visit: Payer: Self-pay

## 2020-06-04 ENCOUNTER — Encounter: Payer: Self-pay | Admitting: Cardiology

## 2020-06-04 VITALS — BP 151/79 | HR 56 | Resp 16 | Ht 71.0 in | Wt 157.0 lb

## 2020-06-04 DIAGNOSIS — Z953 Presence of xenogenic heart valve: Secondary | ICD-10-CM

## 2020-06-04 DIAGNOSIS — I714 Abdominal aortic aneurysm, without rupture, unspecified: Secondary | ICD-10-CM

## 2020-06-04 DIAGNOSIS — I10 Essential (primary) hypertension: Secondary | ICD-10-CM | POA: Diagnosis not present

## 2020-06-04 DIAGNOSIS — E7800 Pure hypercholesterolemia, unspecified: Secondary | ICD-10-CM | POA: Diagnosis not present

## 2020-06-04 MED ORDER — OLMESARTAN MEDOXOMIL-HCTZ 40-25 MG PO TABS: 1.0000 | ORAL_TABLET | Freq: Every day | ORAL | 3 refills | Status: DC

## 2020-06-04 NOTE — Progress Notes (Signed)
Primary Physician/Referring:  Dion Body, MD  Patient ID: Willie Freeman, male    DOB: 11-Jun-1946, 74 y.o.   MRN: [id number]  Chief Complaint  Patient presents with  . AVR  . Follow-up    1 year   HPI:    Willie Freeman  is a 74 y.o. Caucasian male with Aortic Valvular heart disease S/P Bioprosthetic AVR, hyperlipidemia and HTN, H/O NSTEMI on  11/02/2013 and also dysarthria and confusional state and was diagnosed as having TIA. His symptoms resolved, was taken emergently to the cath lab, was found to have no significant coronary artery disease but was found to have critical aortic stenosis. He underwent aortic valve replacement on 11/15/2013.    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 Patient is scheduled for AAA repair on 06/12/2020 at [facility] by  Dr. Montel Culver.  Otherwise he remains asymptomatic and he has not had any further recurrence of abdominal discomfort.   Past Medical History:  Diagnosis Date  . Back pain   . Borderline diabetes mellitus 11/09/2015  . BPH (benign prostatic hypertrophy)   . Dysrhythmia   . Elevated PSA   . Heart murmur   . History of elevated PSA 05/21/2015  . Hydrocele   . Hypertension   . Myocardial infarction ([place])   . Nocturia   . Stroke ([place])    ministroke  . TIA (transient ischemic attack) 11/02/2013  . Urinary frequency    Past Surgical History:  Procedure Laterality Date  . AORTIC VALVE REPLACEMENT N/A 11/15/2013   Procedure: AORTIC VALVE REPLACEMENT (AVR);  Surgeon: Gaye Pollack, MD;  Location: [place];  Service: Open Heart Surgery;  Laterality: N/A;  . COLONOSCOPY WITH PROPOFOL N/A 02/15/2016   Procedure: COLONOSCOPY WITH PROPOFOL;  Surgeon: Lollie Sails, MD;  Location: [facility] ENDOSCOPY;  Service: Endoscopy;  Laterality: N/A;  . HERNIA REPAIR   1954   bilateral - inguinal   . HYDROCELE EXCISION Right 01/17/2019   Procedure: HYDROCELECTOMY ADULT;  Surgeon: Abbie Sons, MD;  Location: [facility] ORS;  Service: Urology;  Laterality: Right;  . INTRAOPERATIVE TRANSESOPHAGEAL ECHOCARDIOGRAM N/A 11/15/2013   Procedure: INTRAOPERATIVE TRANSESOPHAGEAL ECHOCARDIOGRAM;  Surgeon: Gaye Pollack, MD;  Location: [facility] OR;  Service: Open Heart Surgery;  Laterality: N/A;  . LEFT HEART CATHETERIZATION WITH CORONARY ANGIOGRAM N/A 11/02/2013   Procedure: LEFT HEART CATHETERIZATION WITH CORONARY ANGIOGRAM;  Surgeon: Laverda Page, MD;  Location: [facility] CATH LAB;  Service: Cardiovascular;  Laterality: N/A;   Social History   Tobacco Use  . Smoking status: Former Smoker    Types: Cigars, Pipe  . Smokeless tobacco: Former Systems developer    Quit date: 11/11/1973  . Tobacco comment: quit 50 years  Substance Use Topics  . Alcohol use: Yes    Comment: Rarely- beer, glass of wine    Marital Status: Married   ROS  Review of Systems  Cardiovascular: Negative for chest pain, dyspnea on exertion and leg swelling.  Gastrointestinal: Negative for melena.   Objective  Blood pressure (!) 151/79, pulse (!) 56, resp. rate 16, height 5' 11"  (1.803 m), weight 157 lb (71.2 kg), SpO2 98 %.  Vitals with BMI 06/04/2020 05/24/2020 05/24/2020  Height 5' 11"  - -  Weight 157 lbs - -  BMI 46.27 - -  Systolic 035 009 381  Diastolic 79 85 83  Pulse 56 84 79     Physical Exam Constitutional:      General: He is not in acute distress.    Appearance: He is well-developed.  HENT:     Head: Atraumatic.  Eyes:     Conjunctiva/sclera: Conjunctivae normal.  Neck:     Thyroid: No thyromegaly.     Vascular: No JVD.  Cardiovascular:     Rate and Rhythm: Normal rate and regular rhythm.     Pulses:          Carotid pulses are on the right side with bruit and on the left side with bruit.      Femoral pulses are 2+ on the right side and 2+ on the left side.      Popliteal pulses are 2+ on  the right side and 2+ on the left side.       Dorsalis pedis pulses are 2+ on the right side and 1+ on the left side.       Posterior tibial pulses are 2+ on the right side and 1+ on the left side.     Heart sounds: S1 normal and S2 normal. Murmur heard.   Midsystolic murmur is present with a grade of 2/6 radiating to the neck. No gallop.   Pulmonary:     Effort: Pulmonary effort is normal.     Breath sounds: Normal breath sounds.  Abdominal:     General: Bowel sounds are normal.     Palpations: Abdomen is soft.  Musculoskeletal:        General: Normal range of motion.     Cervical back: Neck supple.  Skin:    General: Skin is warm and dry.  Neurological:     Mental Status: He is alert.    Laboratory examination:   Recent Labs    05/23/20 2248  NA 138  K 3.7  CL 101  CO2 28  GLUCOSE 120*  BUN 21  CREATININE 0.89  CALCIUM 8.9  GFRNONAA >60   estimated creatinine clearance is 71.1 mL/min (by C-G formula based on SCr of 0.89 mg/dL).  CMP Latest Ref Rng & Units 05/23/2020 10/29/2014 11/18/2013  Glucose 70 - 99 mg/dL 120(H) 122(H) 117(H)  BUN 8 - 23 mg/dL 21 21(H) 19  Creatinine 0.61 - 1.24 mg/dL 0.89 1.04 0.76  Sodium 135 - 145 mmol/L 138 143 137  Potassium 3.5 - 5.1 mmol/L 3.7 3.9 4.1  Chloride 98 - 111 mmol/L 101 108 101  CO2 22 - 32 mmol/L 28 30 26   Calcium 8.9 - 10.3 mg/dL 8.9 8.7(L) 8.4  Total Protein 6.5 - 8.1 g/dL 6.8 - -  Total Bilirubin 0.3 - 1.2 mg/dL 1.2 - -  Alkaline Phos 38 - 126 U/L 72 - -  AST 15 - 41 U/L 18 - -  ALT 0 - 44 U/L 17 - -   CBC Latest Ref Rng & Units 05/23/2020 02/15/2016 10/29/2014  WBC 4.0 - 10.5 K/uL 6.9 4.7 6.9  Hemoglobin 13.0 - 17.0 g/dL 12.5(L) 12.4(L) 12.8(L)  Hematocrit 39.0 - 52.0 % 37.8(L) 36.5(L) 38.7(L)  Platelets 150 - 400 K/uL 150 128(L) 144(L)   External labs:   Lab 12/02/2019:  Total cholesterol 118, triglycerides 77, HDL 43, LDL 59.  A1c 5.9%.  Serum glucose 99 mg, BUN 24, creatinine 0.9, EGFR >60 mL, CMP otherwise  normal.  Hb 11.8/HCT 37.1, platelets 142, stable.  Mild microcytic indicis.    Medications and allergies  No Known Allergies   Current Outpatient Medications  Medication Instructions  . aspirin 81 mg, Oral, 2 times daily  . atorvastatin (LIPITOR) 10 mg, Oral, Daily  . finasteride (PROSCAR) 5 mg, Oral, Daily  . olmesartan-hydrochlorothiazide (BENICAR HCT) 40-25 MG tablet 1 tablet, Oral, Daily  . triamcinolone cream (KENALOG) 0.1 % 1 application, Topical, Daily PRN    Radiology:   CT of the abdomen and pelvis 05/24/2020: Mild Peri duodenal inflammatory change involving the second and third portion of the duodenum suggesting changes of a mild duodenitis. No evidence of obstruction or perforation.   Fusiform 3.0 cm infrarenal abdominal aortic aneurysm. 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 Given its size, vascular consultation is recommended for further management.   Moderate prostatic enlargement. Mild bladder wall thickening suggest changes of a mild bladder outlet obstruction.   Moderate fat containing right indirect inguinal hernia.   Cardiac Studies:   Coronary angiogram 11/02/2013: Severe aortic stenosis, no significant coronary artery disease.  Minimal calcification in LAD otherwise normal.  Normal left ventricle systolic function.  Carotid Doppler  06/09/2017: No hemodynamically significant arterial disease in the internal carotid artery bilaterally. There is mild soft plaque in bilateral carotid bulbs. Antegrade right vertebral artery flow. Antegrade left vertebral artery flow.  Echocardiogram 07/20/2018: Left ventricle cavity is normal in size. Moderate concentric hypertrophy of the left ventricle. Normal global wall motion. Normal diastolic filling pattern. Calculated EF 55%. Left atrial cavity is mildly dilated at 4.1 cm. Right atrial cavity is mildly dilated. Bioprosthetic  aortic valve with no regurgitation noted. Trace aortic valve stenosis. Aortic valve peak pressure gradient of 18 and mean gradient of 10 mmHg, calculated aortic valve area 1.37 cm. Mild mitral valve leaflet thickening. Mild (Grade I) mitral regurgitation. Mild tricuspid regurgitation. No evidence of pulmonary hypertension. Overall there is no significant change since 12/18/2015.  EKG:   EKG 06/04/2020: Sinus bradycardia at the rate of 53 bpm, normal axis, LVH by voltage criteria.  No evidence of ischemia.  No significant change from 06/13/2019.  Assessment     ICD-10-CM   1. Essential hypertension  I10 EKG 12-Lead    olmesartan-hydrochlorothiazide (BENICAR HCT) 40-25 MG tablet  2. H/O aortic valve replacement with porcine valve  Z95.3   3. Pure hypercholesterolemia  E78.00   4. AAA (abdominal aortic aneurysm) without rupture (HCC)  I71.4     Meds ordered this encounter  Medications  . olmesartan-hydrochlorothiazide (BENICAR HCT) 40-25 MG tablet    Sig: Take 1 tablet by mouth daily.    Dispense:  90 tablet    Refill:  3   Medications Discontinued During This Encounter  Medication Reason  . amLODipine (NORVASC) 5 MG tablet Change in therapy  . tamsulosin (FLOMAX) 0.4 MG CAPS capsule Patient Preference  . fesoterodine (TOVIAZ) 4 MG TB24 tablet Patient Preference  . traZODone (DESYREL) 50 MG tablet Patient Preference  . olmesartan-hydrochlorothiazide (BENICAR HCT) 20-12.5 MG tablet Dose change     Recommendations:   Meds ordered this encounter  Medications  . olmesartan-hydrochlorothiazide (BENICAR HCT) 40-25 MG tablet    Sig: Take 1 tablet by mouth daily.    Dispense:  90 tablet    Refill:  3    Willie Freeman  is a 74 y.o.  Caucasian male with Aortic Valvular heart disease S/P Bioprosthetic AVR, hyperlipidemia and HTN, H/O NSTEMI on  11/02/2013 and also dysarthria and confusional state and was diagnosed as having TIA. His symptoms resolved, was taken emergently to the cath  lab, was found to have no significant coronary artery disease but was found to have critical aortic stenosis. He underwent aortic valve replacement on 11/15/2013.    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 Patient is scheduled for AAA repair on 06/12/2020 at [facility] by  Dr. Montel Culver.  No change in his physical exam, blood pressure remains elevated.  Also his external labs, external records reviewed, blood pressure is not well controlled, will increase his Benicar HCT from 20/12.5 mg to 40/25 mg in the morning.  If blood pressure is not well controlled with goal blood pressure being 130/80 mmHg, patient will contact us and I plan on restarting amlodipine 5 mg daily.  In view of recent procedure that has been scheduled, I will schedule him for a repeat office visit for hypertension follow-up as well in 6 weeks.  I will continue to follow his labs.  This was a 40-minute encounter in review of external records, coordination of care.  I will send a note to Dr. Montel Culver, patient can proceed with surgery with acceptable cardiovascular risk.   Adrian Prows, MD, [facility] 06/04/2020, 9:58 AM Office: [phone] Pager: [phone]   CC: Montel Culver, MD

## 2020-06-05 DIAGNOSIS — I10 Essential (primary) hypertension: Secondary | ICD-10-CM | POA: Diagnosis not present

## 2020-06-05 DIAGNOSIS — I714 Abdominal aortic aneurysm, without rupture: Secondary | ICD-10-CM | POA: Diagnosis not present

## 2020-06-12 DIAGNOSIS — Z01818 Encounter for other preprocedural examination: Secondary | ICD-10-CM | POA: Diagnosis not present

## 2020-06-12 DIAGNOSIS — Z01810 Encounter for preprocedural cardiovascular examination: Secondary | ICD-10-CM | POA: Diagnosis not present

## 2020-06-12 DIAGNOSIS — I714 Abdominal aortic aneurysm, without rupture: Secondary | ICD-10-CM | POA: Diagnosis not present

## 2020-06-12 DIAGNOSIS — R001 Bradycardia, unspecified: Secondary | ICD-10-CM | POA: Diagnosis not present

## 2020-06-18 ENCOUNTER — Other Ambulatory Visit: Payer: Self-pay

## 2020-06-18 MED ORDER — OLMESARTAN MEDOXOMIL-HCTZ 20-12.5 MG PO TABS
2.0000 | ORAL_TABLET | Freq: Every day | ORAL | 1 refills | Status: DC
Start: 2020-06-18 — End: 2020-06-19

## 2020-06-19 ENCOUNTER — Other Ambulatory Visit: Payer: Self-pay

## 2020-06-19 DIAGNOSIS — N400 Enlarged prostate without lower urinary tract symptoms: Secondary | ICD-10-CM | POA: Diagnosis not present

## 2020-06-19 DIAGNOSIS — I714 Abdominal aortic aneurysm, without rupture: Secondary | ICD-10-CM | POA: Diagnosis not present

## 2020-06-19 DIAGNOSIS — K219 Gastro-esophageal reflux disease without esophagitis: Secondary | ICD-10-CM | POA: Diagnosis not present

## 2020-06-19 DIAGNOSIS — Z8673 Personal history of transient ischemic attack (TIA), and cerebral infarction without residual deficits: Secondary | ICD-10-CM | POA: Diagnosis not present

## 2020-06-19 DIAGNOSIS — I10 Essential (primary) hypertension: Secondary | ICD-10-CM | POA: Diagnosis not present

## 2020-06-19 DIAGNOSIS — N289 Disorder of kidney and ureter, unspecified: Secondary | ICD-10-CM | POA: Diagnosis not present

## 2020-06-19 DIAGNOSIS — E785 Hyperlipidemia, unspecified: Secondary | ICD-10-CM | POA: Diagnosis not present

## 2020-06-19 DIAGNOSIS — Z20822 Contact with and (suspected) exposure to covid-19: Secondary | ICD-10-CM | POA: Diagnosis not present

## 2020-06-19 DIAGNOSIS — I252 Old myocardial infarction: Secondary | ICD-10-CM | POA: Diagnosis not present

## 2020-06-19 DIAGNOSIS — Z7982 Long term (current) use of aspirin: Secondary | ICD-10-CM | POA: Diagnosis not present

## 2020-06-19 DIAGNOSIS — K4090 Unilateral inguinal hernia, without obstruction or gangrene, not specified as recurrent: Secondary | ICD-10-CM | POA: Diagnosis not present

## 2020-06-19 DIAGNOSIS — R338 Other retention of urine: Secondary | ICD-10-CM | POA: Diagnosis not present

## 2020-06-19 DIAGNOSIS — I719 Aortic aneurysm of unspecified site, without rupture: Secondary | ICD-10-CM | POA: Diagnosis not present

## 2020-06-19 DIAGNOSIS — Z7902 Long term (current) use of antithrombotics/antiplatelets: Secondary | ICD-10-CM | POA: Diagnosis not present

## 2020-06-19 DIAGNOSIS — I739 Peripheral vascular disease, unspecified: Secondary | ICD-10-CM | POA: Diagnosis not present

## 2020-06-19 DIAGNOSIS — I713 Abdominal aortic aneurysm, ruptured: Secondary | ICD-10-CM | POA: Diagnosis not present

## 2020-06-19 HISTORY — PX: ABDOMINAL AORTIC ANEURYSM REPAIR: SUR1152

## 2020-06-19 MED ORDER — OLMESARTAN MEDOXOMIL-HCTZ 20-12.5 MG PO TABS
2.0000 | ORAL_TABLET | Freq: Every day | ORAL | 1 refills | Status: DC
Start: 2020-06-19 — End: 2020-06-25

## 2020-06-23 ENCOUNTER — Other Ambulatory Visit: Payer: Self-pay | Admitting: Cardiology

## 2020-06-25 ENCOUNTER — Other Ambulatory Visit: Payer: Self-pay | Admitting: Cardiology

## 2020-06-25 MED ORDER — OLMESARTAN MEDOXOMIL-HCTZ 40-25 MG PO TABS: 1.0000 | ORAL_TABLET | ORAL | 3 refills | Status: DC

## 2020-06-25 NOTE — Telephone Encounter (Signed)
From pharmacy :  Pharmacy comment: ins will not cover 2qd - ok to use olmesart 40/25 1qd?  Please advise.

## 2020-06-26 ENCOUNTER — Ambulatory Visit (INDEPENDENT_AMBULATORY_CARE_PROVIDER_SITE_OTHER): Payer: Medicare HMO | Admitting: Physician Assistant

## 2020-06-26 ENCOUNTER — Encounter: Payer: Self-pay | Admitting: Physician Assistant

## 2020-06-26 ENCOUNTER — Other Ambulatory Visit: Payer: Self-pay

## 2020-06-26 ENCOUNTER — Ambulatory Visit: Payer: Medicare Other | Admitting: Physician Assistant

## 2020-06-26 VITALS — BP 129/79 | HR 89 | Ht 71.0 in | Wt 155.0 lb

## 2020-06-26 DIAGNOSIS — R310 Gross hematuria: Secondary | ICD-10-CM | POA: Diagnosis not present

## 2020-06-26 DIAGNOSIS — N401 Enlarged prostate with lower urinary tract symptoms: Secondary | ICD-10-CM

## 2020-06-26 DIAGNOSIS — N138 Other obstructive and reflux uropathy: Secondary | ICD-10-CM

## 2020-06-26 LAB — BLADDER SCAN AMB NON-IMAGING

## 2020-06-26 MED ORDER — FINASTERIDE 5 MG PO TABS: 5.0000 mg | ORAL_TABLET | Freq: Every day | ORAL | 3 refills | Status: DC

## 2020-06-26 MED ORDER — TAMSULOSIN HCL 0.4 MG PO CAPS: 0.4000 mg | ORAL_CAPSULE | Freq: Every day | ORAL | 3 refills | Status: AC

## 2020-06-26 NOTE — Progress Notes (Signed)
06/26/2020 9:49 AM   Willie Freeman July 13, 1945 [id number]  CC: Chief Complaint  Patient presents with  . Follow-up   HPI: Willie Freeman is a 75 y.o. male with PMH BPH with LUTS, right hydrocele s/p right hydrocelectomy with Dr. Lonna Cobb in 2020, and postoperative urinary retention who developed urinary retention after undergoing endovascular AAA repair 1 week ago who presents today for voiding trial.  He is accompanied today by his wife, who contributes to HPI.  Patient was started on Flomax 1 week ago.  Wife reports she is glad for this, as she believes he has been having difficulty urinating for many months leading up to this and and not previously been on any prostatic agents.  Per chart review however, patient has been on Flomax and finasteride intermittently dating back to at least 2015.  On further questioning, patient believes his prescriptions may have run out less than a year ago and were not renewed.  Additionally, they note intermittent gross hematuria with Foley catheter in place.  Patient also notes a remote history of gross hematuria 2 or 3 years ago.  He does have a history of nephrolithiasis, however it is unclear if his gross hematuria was associated with stone passage.  He denies a history of tobacco use.  He takes aspirin 81 mg twice daily.  He underwent CTAP with contrast on 05/24/2020 with findings of a moderately enlarged prostate and mild bladder wall thickening without evidence of urinary malignancy or urolithiasis.  PMH: Past Medical History:  Diagnosis Date  . Back pain   . Borderline diabetes mellitus 11/09/2015  . BPH (benign prostatic hypertrophy)   . Dysrhythmia   . Elevated PSA   . Heart murmur   . History of elevated PSA 05/21/2015  . Hydrocele   . Hypertension   . Myocardial infarction (HCC)   . Nocturia   . Stroke ([facility])    ministroke  . TIA (transient ischemic attack) 11/02/2013  . Urinary frequency     Surgical History: Past Surgical  History:  Procedure Laterality Date  . AORTIC VALVE REPLACEMENT N/A 11/15/2013   Procedure: AORTIC VALVE REPLACEMENT (AVR);  Surgeon: Alleen Borne, MD;  [facility]: [facility] OR;  Service: Open Heart Surgery;  Laterality: N/A;  . COLONOSCOPY WITH PROPOFOL N/A 02/15/2016   Procedure: COLONOSCOPY WITH PROPOFOL;  Surgeon: Christena Deem, MD;  Location: [facility] ENDOSCOPY;  Service: Endoscopy;  Laterality: N/A;  . HERNIA REPAIR  1954   bilateral - inguinal   . HYDROCELE EXCISION Right 01/17/2019   Procedure: HYDROCELECTOMY ADULT;  Surgeon: Riki Altes, MD;  Location: [facility] ORS;  Service: Urology;  Laterality: Right;  . INTRAOPERATIVE TRANSESOPHAGEAL ECHOCARDIOGRAM N/A 11/15/2013   Procedure: INTRAOPERATIVE TRANSESOPHAGEAL ECHOCARDIOGRAM;  Surgeon: Alleen Borne, MD;  Location: [facility] OR;  Service: Open Heart Surgery;  Laterality: N/A;  . LEFT HEART CATHETERIZATION WITH CORONARY ANGIOGRAM N/A 11/02/2013   Procedure: LEFT HEART CATHETERIZATION WITH CORONARY ANGIOGRAM;  Surgeon: Pamella Pert, MD;  Location: [facility] CATH LAB;  Service: Cardiovascular;  Laterality: N/A;    Home Medications:  Allergies as of 06/26/2020   No Known Allergies     Medication List       Accurate as of June 26, 2020  9:49 AM. If you have any questions, ask your nurse or Freeman.        aspirin 81 MG chewable tablet Chew 81 mg by mouth 2 (two) times daily.   atorvastatin 10 MG tablet Commonly known as: LIPITOR Take 1 tablet (  10 mg total) by mouth daily.   finasteride 5 MG tablet Commonly known as: PROSCAR TAKE 1 TABLET (5 MG TOTAL) BY MOUTH DAILY.   olmesartan-hydrochlorothiazide 40-25 MG tablet Commonly known as: BENICAR HCT Take 1 tablet by mouth every morning.   tamsulosin 0.4 MG Caps capsule Commonly known as: FLOMAX Take by mouth.   triamcinolone 0.1 % Commonly known as: KENALOG Apply 1 application topically daily as needed (itching).       Allergies:  No Known Allergies  Family History: Family  History  Problem Relation Age of Onset  . Prostate cancer Father   . Heart attack Mother   . Hypertension Brother   . Coronary artery disease Brother   . Kidney disease Neg Hx   . Kidney cancer Neg Hx   . Bladder Cancer Neg Hx     Social History:   reports that he has quit smoking. His smoking use included cigars and pipe. He quit smokeless tobacco use about 46 years ago. He reports current alcohol use. He reports that he does not use drugs.  Physical Exam: BP 129/79 (BP Location: Left Arm, Patient Position: Sitting, Cuff Size: Normal)   Pulse 89   Ht 5\' 11"  (1.803 m)   Wt 155 lb (70.3 kg)   BMI 21.62 kg/m   Constitutional:  Alert and oriented, no acute distress, nontoxic appearing HEENT: [place], AT Cardiovascular: No clubbing, cyanosis, or edema Respiratory: Normal respiratory effort, no increased work of breathing Skin: No rashes, bruises or suspicious lesions Neurologic: Grossly intact, no focal deficits, moving all 4 extremities Psychiatric: Normal mood and affect  Laboratory Data: Results for orders placed or performed in visit on 06/26/20  Bladder Scan (Post Void Residual) in office  Result Value Ref Range   Scan Result 08/24/20    Scan Result    Assessment & Plan:   1. BPH with obstruction/lower urinary tract symptoms Foley catheter removed in the morning, see separate procedure note for details. Patient returned to clinic this afternoon for repeat PVR. He has been able to urinate. PVR originally 342 mL.  He subsequently felt the urge to urinate with repeat PVR 180 mL.  Voiding trial passed. Counseled patient on the risk for recurrent urinary retention.  I recommended continuation of Flomax and reinitiation of finasteride.  Patient is in agreement with this plan.  We discussed ED side effects of finasteride and he is willing to proceed. - Bladder Scan (Post Void Residual) in office - tamsulosin (FLOMAX) 0.4 MG CAPS capsule; Take 1 capsule (0.4 mg total) by mouth daily.   Dispense: 90 capsule; Refill: 3 - finasteride (PROSCAR) 5 MG tablet; Take 1 tablet (5 mg total) by mouth daily.  Dispense: 90 tablet; Refill: 3  2. Hematuria, gross Likely prostatic in origin given his history of BPH on aspirin and irritation from Foley catheter.  I recommend cystoscopy at this time to rule out other sources of bleeding, especially given his unclear history of gross hematuria within the past several years.  Additionally, evaluation of his prostatic anatomy may be beneficial if he ultimately decides to pursue bladder outlet procedures.  Patient is in agreement with this plan.  Return in about 4 weeks (around 07/24/2020) for Cystoscopy with Dr. 09/21/2020.  Lonna Cobb, PA-C  [facility] [address], [address] [place], [place] [phone]

## 2020-06-26 NOTE — Patient Instructions (Signed)

## 2020-06-26 NOTE — Progress Notes (Signed)
Catheter Removal  Patient is present today for a catheter removal.  9ml of water was drained from the balloon. A 16FR coude foley cath was removed from the bladder no complications were noted . Patient tolerated well.  Performed by: Siera Beyersdorf, PA-C   Follow up/ Additional notes: Push fluids and RTC this afternoon for PVR. 

## 2020-07-19 ENCOUNTER — Ambulatory Visit: Payer: Medicare HMO | Admitting: Cardiology

## 2020-07-25 ENCOUNTER — Other Ambulatory Visit: Payer: Self-pay | Admitting: Urology

## 2020-07-26 ENCOUNTER — Ambulatory Visit: Payer: Medicare HMO | Admitting: Cardiology

## 2020-07-30 ENCOUNTER — Encounter: Payer: Self-pay | Admitting: Cardiology

## 2020-07-30 ENCOUNTER — Other Ambulatory Visit: Payer: Self-pay

## 2020-07-30 ENCOUNTER — Ambulatory Visit: Payer: Medicare HMO | Admitting: Cardiology

## 2020-07-30 VITALS — BP 126/76 | HR 63 | Temp 98.2°F | Resp 16 | Ht 71.0 in | Wt 164.2 lb

## 2020-07-30 DIAGNOSIS — Z8679 Personal history of other diseases of the circulatory system: Secondary | ICD-10-CM

## 2020-07-30 DIAGNOSIS — Z953 Presence of xenogenic heart valve: Secondary | ICD-10-CM

## 2020-07-30 DIAGNOSIS — I10 Essential (primary) hypertension: Secondary | ICD-10-CM

## 2020-07-30 DIAGNOSIS — Z9889 Other specified postprocedural states: Secondary | ICD-10-CM | POA: Insufficient documentation

## 2020-07-30 MED ORDER — OLMESARTAN MEDOXOMIL-HCTZ 40-25 MG PO TABS: 1.0000 | ORAL_TABLET | ORAL | 3 refills | Status: DC

## 2020-07-30 NOTE — Progress Notes (Signed)
Primary Physician/Referring:  Dion Body, MD  Patient ID: Willie Freeman, male    DOB: [birth year], 75 y.o.   MRN: [id number]  Chief Complaint  Patient presents with  . Hypertension  . AAA    Repair  . Follow-up    6 weeks    HPI:    Willie Freeman  is a 75 y.o. Caucasian male with Aortic Valvular heart disease S/P Bioprosthetic AVR, hyperlipidemia and HTN, H/O NSTEMI on  11/02/2013 and also dysarthria and confusional state and was diagnosed as having TIA. His symptoms resolved, was taken emergently to the cath lab, was found to have no significant coronary artery disease but was found to have critical aortic stenosis. He underwent aortic valve replacement on 11/15/2013.    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  Underwent endovascular repair on 06/19/2020 at [facility] by  Dr. Montel Culver.  Otherwise he remains asymptomatic and he has not had any further recurrence of abdominal discomfort.  No complications from the procedure.   Past Medical History:  Diagnosis Date  . Back pain   . Borderline diabetes mellitus 11/09/2015  . BPH (benign prostatic hypertrophy)   . Dysrhythmia   . Elevated PSA   . Heart murmur   . History of elevated PSA 05/21/2015  . Hydrocele   . Hypertension   . Myocardial infarction ([place])   . Nocturia   . Stroke ([place])    ministroke  . TIA (transient ischemic attack) 11/02/2013  . Urinary frequency    Past Surgical History:  Procedure Laterality Date  . ABDOMINAL AORTIC ANEURYSM REPAIR  06/19/2020   Endovascular aneurysm repairGore Excluder [id number] Contralateral right iliac limb, 14x10   . AORTIC VALVE REPLACEMENT N/A 11/15/2013   Procedure: AORTIC VALVE REPLACEMENT (AVR);  Surgeon: Gaye Pollack, MD;  Location: [place];  Service: Open Heart Surgery;  Laterality:  N/A;  . COLONOSCOPY WITH PROPOFOL N/A 02/15/2016   Procedure: COLONOSCOPY WITH PROPOFOL;  Surgeon: Lollie Sails, MD;  Location: [facility] ENDOSCOPY;  Service: Endoscopy;  Laterality: N/A;  . HERNIA REPAIR  1954   bilateral - inguinal   . HYDROCELE EXCISION Right 01/17/2019   Procedure: HYDROCELECTOMY ADULT;  Surgeon: Abbie Sons, MD;  Location: [facility] ORS;  Service: Urology;  Laterality: Right;  . INTRAOPERATIVE TRANSESOPHAGEAL ECHOCARDIOGRAM N/A 11/15/2013   Procedure: INTRAOPERATIVE TRANSESOPHAGEAL ECHOCARDIOGRAM;  Surgeon: Gaye Pollack, MD;  Location: [facility] OR;  Service: Open Heart Surgery;  Laterality: N/A;  . LEFT HEART CATHETERIZATION WITH CORONARY ANGIOGRAM N/A 11/02/2013   Procedure: LEFT HEART CATHETERIZATION WITH CORONARY ANGIOGRAM;  Surgeon: Laverda Page, MD;  Location: [facility] CATH LAB;  Service: Cardiovascular;  Laterality: N/A;   Social History   Tobacco Use  . Smoking status: Former Smoker    Types: Cigars, Pipe  . Smokeless tobacco: Never Used  . Tobacco comment: quit 50 years  Substance Use Topics  . Alcohol use: Yes    Comment: Rarely- beer, glass of wine    Marital Status: Married   ROS  Review of Systems  Cardiovascular: Negative for chest pain, dyspnea on exertion and leg swelling.  Gastrointestinal: Negative for melena.   Objective  Blood pressure 126/76, pulse 63, temperature 98.2 F (36.8 C), temperature source Temporal, resp. rate 16, height _0  (1.803 m), weight 164 lb 3.2 oz (74.5 kg), SpO2 99 %.  Vitals with BMI 07/30/2020 06/26/2020 06/04/2020  Height _0  _1  _2   Weight 164 lbs 3 oz 155 lbs 157 lbs  BMI 22.91 32.44 01.02  Systolic 725 366 440  Diastolic 76 79 79  Pulse 63 89 56     Physical Exam Constitutional:      General: He is not in acute distress.    Appearance: He is well-developed.  HENT:     Head: Atraumatic.  Eyes:     Conjunctiva/sclera: Conjunctivae normal.  Neck:     Thyroid: No thyromegaly.     Vascular: No JVD.   Cardiovascular:     Rate and Rhythm: Normal rate and regular rhythm.     Pulses:          Carotid pulses are on the right side with bruit and on the left side with bruit.      Femoral pulses are 2+ on the right side and 2+ on the left side.      Popliteal pulses are 2+ on the right side and 2+ on the left side.       Dorsalis pedis pulses are 2+ on the right side and 1+ on the left side.       Posterior tibial pulses are 2+ on the right side and 1+ on the left side.     Heart sounds: S1 normal and S2 normal. Murmur heard.   Midsystolic murmur is present with a grade of 2/6 radiating to the neck. No gallop.   Pulmonary:     Effort: Pulmonary effort is normal.     Breath sounds: Normal breath sounds.  Abdominal:     General: Bowel sounds are normal.     Palpations: Abdomen is soft.  Musculoskeletal:        General: Normal range of motion.     Cervical back: Neck supple.  Skin:    General: Skin is warm and dry.  Neurological:     Mental Status: He is alert.    Laboratory examination:   Recent Labs    05/23/20 2248  NA 138  K 3.7  CL 101  CO2 28  GLUCOSE 120*  BUN 21  CREATININE 0.89  CALCIUM 8.9  GFRNONAA >60   CrCl cannot be calculated (Patient's most recent lab result is older than the maximum 21 days allowed.).  CMP Latest Ref Rng & Units 05/23/2020 10/29/2014 11/18/2013  Glucose 70 - 99 mg/dL 120(H) 122(H) 117(H)  BUN 8 - 23 mg/dL 21 21(H) 19  Creatinine 0.61 - 1.24 mg/dL 0.89 1.04 0.76  Sodium 135 - 145 mmol/L 138 143 137  Potassium 3.5 - 5.1 mmol/L 3.7 3.9 4.1  Chloride 98 - 111 mmol/L 101 108 101  CO2 22 - 32 mmol/L _3 Calcium 8.9 - 10.3 mg/dL 8.9 8.7(L) 8.4  Total Protein 6.5 - 8.1 g/dL 6.8 - -  Total Bilirubin 0.3 - 1.2 mg/dL 1.2 - -  Alkaline Phos 38 - 126 U/L 72 - -  AST 15 - 41 U/L 18 - -  ALT 0 - 44 U/L 17 - -   CBC Latest Ref Rng & Units 05/23/2020 02/15/2016 10/29/2014  WBC 4.0 - 10.5 K/uL 6.9 4.7 6.9  Hemoglobin 13.0 - 17.0 g/dL 12.5(L) 12.4(L)  12.8(L)  Hematocrit 39.0 - 52.0 % 37.8(L) 36.5(L) 38.7(L)  Platelets 150 - 400 K/uL 150 128(L) 144(L)   External labs:   Lab 12/02/2019:  Total cholesterol 118, triglycerides 77, HDL 43, LDL 59.  A1c  5.9%.  Serum glucose 99 mg, BUN 24, creatinine 0.9, EGFR >60 mL, CMP otherwise normal.  Hb 11.8/HCT 37.1, platelets 142, stable.  Mild microcytic indicis.    Medications and allergies  No Known Allergies   Current Outpatient Medications  Medication Instructions  . aspirin 81 mg, Oral, 2 times daily  . atorvastatin (LIPITOR) 10 mg, Oral, Daily  . finasteride (PROSCAR) 5 mg, Oral, Daily  . olmesartan-hydrochlorothiazide (BENICAR HCT) 40-25 MG tablet 1 tablet, Oral, BH-each morning  . tamsulosin (FLOMAX) 0.4 mg, Oral, Daily  . triamcinolone cream (KENALOG) 0.1 % 1 application, Topical, Daily PRN    Radiology:   CT of the abdomen and pelvis 05/24/2020: Mild Peri duodenal inflammatory change involving the second and third portion of the duodenum suggesting changes of a mild duodenitis. No evidence of obstruction or perforation.   Fusiform 3.0 cm infrarenal abdominal aortic aneurysm. 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 Given its size, vascular consultation is recommended for further management.   Moderate prostatic enlargement. Mild bladder wall thickening suggest changes of a mild bladder outlet obstruction.   Moderate fat containing right indirect inguinal hernia.   Cardiac Studies:   Coronary angiogram 11/02/2013: Severe aortic stenosis, no significant coronary artery disease.  Minimal calcification in LAD otherwise normal.  Normal left ventricle systolic function.  Carotid Doppler  06/09/2017: No hemodynamically significant arterial disease in the internal carotid artery bilaterally. There is mild soft plaque in bilateral carotid bulbs. Antegrade right vertebral artery flow.  Antegrade left vertebral artery flow.  Echocardiogram 07/20/2018: Left ventricle cavity is normal in size. Moderate concentric hypertrophy of the left ventricle. Normal global wall motion. Normal diastolic filling pattern. Calculated EF 55%. Left atrial cavity is mildly dilated at 4.1 cm. Right atrial cavity is mildly dilated. Bioprosthetic aortic valve with no regurgitation noted. Trace aortic valve stenosis. Aortic valve peak pressure gradient of 18 and mean gradient of 10 mmHg, calculated aortic valve area 1.37 cm. Mild mitral valve leaflet thickening. Mild (Grade I) mitral regurgitation. Mild tricuspid regurgitation. No evidence of pulmonary hypertension. Overall there is no significant change since 12/18/2015.  Endovascular abdominal aortic aneurysm repair 06/19/2020: Simeon Craft Excluder [phone] Contralateral right iliac limb, 14x10 at [place]  EKG:   EKG 06/04/2020: Sinus bradycardia at the rate of 53 bpm, normal axis, LVH by voltage criteria.  No evidence of ischemia.  No significant change from 06/13/2019.  Assessment     ICD-10-CM   1. Essential hypertension  I10 olmesartan-hydrochlorothiazide (BENICAR HCT) 40-25 MG tablet  2. H/O aortic valve replacement with porcine valve  Z95.3   3. Status post percutaneous abdominal aortic aneurysm (AAA) repair: Gore Excluder [phone] Contralateral right iliac limb, 14x10 06/19/2020 [facility]  Z98.890    Z86.79     Meds ordered this encounter  Medications  . olmesartan-hydrochlorothiazide (BENICAR HCT) 40-25 MG tablet    Sig: Take 1 tablet by mouth every morning.    Dispense:  90 tablet    Refill:  3   Medications Discontinued During This Encounter  Medication Reason  . olmesartan-hydrochlorothiazide (BENICAR HCT) 40-25 MG tablet Reorder     Meds ordered this encounter  Medications  . olmesartan-hydrochlorothiazide (BENICAR HCT) 40-25 MG tablet    Sig: Take 1 tablet by mouth every morning.    Dispense:  90 tablet     Refill:  3    Recommendations:    DATHAN ATTIA  is a 75 y.o.  Caucasian  male with Aortic Valvular heart disease S/P Bioprosthetic AVR, hyperlipidemia and HTN, H/O NSTEMI on  11/02/2013 and also dysarthria and confusional state and was diagnosed as having TIA. His symptoms resolved, was taken emergently to the cath lab, was found to have no significant coronary artery disease but was found to have critical aortic stenosis. He underwent aortic valve replacement on 11/15/2013.    He was also found to have penetrating abdominal aortic aneurysm and underwent successful repair on 06/19/2020 at [facility] without complications.  He is tolerating all his medications well, I will increase the dose of Benicar HCT for hypertension, since then his blood pressure has been under excellent control.  Labs have been stable.  I reviewed his external labs and also his external records and updated the chart.  Stable from cardiac standpoint, I will see him back on annual basis.   Adrian Prows, MD, [facility] 07/30/2020, 3:47 PM Office: [phone] Pager: [phone]   CC: Montel Culver, MD

## 2020-07-31 ENCOUNTER — Other Ambulatory Visit: Payer: Self-pay

## 2020-07-31 DIAGNOSIS — I10 Essential (primary) hypertension: Secondary | ICD-10-CM

## 2020-07-31 MED ORDER — OLMESARTAN MEDOXOMIL-HCTZ 40-25 MG PO TABS: 1.0000 | ORAL_TABLET | ORAL | 3 refills | Status: DC

## 2020-09-05 ENCOUNTER — Other Ambulatory Visit: Payer: Self-pay | Admitting: Urology

## 2020-09-11 ENCOUNTER — Other Ambulatory Visit: Payer: Self-pay

## 2020-09-11 DIAGNOSIS — E7800 Pure hypercholesterolemia, unspecified: Secondary | ICD-10-CM

## 2020-09-11 MED ORDER — ATORVASTATIN CALCIUM 10 MG PO TABS: 10.0000 mg | ORAL_TABLET | Freq: Every day | ORAL | 3 refills | Status: DC

## 2020-12-03 ENCOUNTER — Ambulatory Visit: Payer: Medicare HMO | Admitting: Cardiology

## 2021-06-09 ENCOUNTER — Other Ambulatory Visit: Payer: Self-pay | Admitting: Physician Assistant

## 2021-06-09 DIAGNOSIS — N138 Other obstructive and reflux uropathy: Secondary | ICD-10-CM

## 2021-06-11 NOTE — Telephone Encounter (Signed)
Attempted to reach patient to set up OV, no answer, no VM. Will attempt again later.

## 2021-07-20 ENCOUNTER — Other Ambulatory Visit: Payer: Self-pay | Admitting: Cardiology

## 2021-07-20 DIAGNOSIS — E7800 Pure hypercholesterolemia, unspecified: Secondary | ICD-10-CM

## 2021-07-27 ENCOUNTER — Ambulatory Visit
Admission: EM | Admit: 2021-07-27 | Discharge: 2021-07-27 | Disposition: A | Discharge status: Home / Self Care | Payer: Medicare HMO

## 2021-07-27 ENCOUNTER — Encounter: Payer: Self-pay | Admitting: Emergency Medicine

## 2021-07-27 DIAGNOSIS — S61511A Laceration without foreign body of right wrist, initial encounter: Secondary | ICD-10-CM | POA: Diagnosis not present

## 2021-07-27 NOTE — ED Provider Notes (Signed)
Willie Freeman    CSN: [id number] Arrival date & time: 07/27/21  0855      History   Chief Complaint Chief Complaint  Patient presents with   Skin Tear     HPI Willie Freeman is a 76 y.o. male.   HPI Patient presents with skin tear to the right wrist x 3 days. He has been managing the wound at home with gauze and noticing with dressing changes he actually aggravated the wound and causing bleeding due to the bandage adhering to the skin. Patient is here wound care. Patient endorses that he is up to date on tetanus vaccine. Injury occurred after wrist scraped against a piece of machinery.  Patient takes a daily low-dose of aspirin.  Past Medical History:  Diagnosis Date   Back pain    Borderline diabetes mellitus 11/09/2015   BPH (benign prostatic hypertrophy)    Dysrhythmia    Elevated PSA    Heart murmur    History of elevated PSA 05/21/2015   Hydrocele    Hypertension    Myocardial infarction [facility])    Nocturia    Stroke ([place])    ministroke   TIA (transient ischemic attack) 11/02/2013   Urinary frequency     Patient Active Problem List   Diagnosis Date Noted   Essential hypertension 12/30/2017   Thrombocytopenia ([place]) 05/20/2017   Pure hypercholesterolemia 02/10/2017   History of normocytic normochromic anemia 11/09/2015   Borderline diabetes mellitus 11/09/2015   Vaccine counseling 11/08/2015   BPH with obstruction/lower urinary tract symptoms 05/21/2015   History of elevated PSA 05/21/2015   Primary insomnia 09/04/2014   S/P AVR 11/15/2013   TIA (transient ischemic attack) 11/02/2013    Past Surgical History:  Procedure Laterality Date   ABDOMINAL AORTIC ANEURYSM REPAIR  06/19/2020   Endovascular aneurysm repairGore Excluder [id number] Contralateral right iliac limb, 14x10    AORTIC VALVE REPLACEMENT N/A 11/15/2013   Procedure: AORTIC VALVE REPLACEMENT (AVR);  Surgeon: Gaye Pollack, MD;  Location: [place];  Service: Open Heart Surgery;  Laterality: N/A;    COLONOSCOPY WITH PROPOFOL N/A 02/15/2016   Procedure: COLONOSCOPY WITH PROPOFOL;  Surgeon: Lollie Sails, MD;  Location: [facility] ENDOSCOPY;  Service: Endoscopy;  Laterality: N/A;   HERNIA REPAIR  1954   bilateral - inguinal    HYDROCELE EXCISION Right 01/17/2019   Procedure: HYDROCELECTOMY ADULT;  Surgeon: Abbie Sons, MD;  Location: [facility] ORS;  Service: Urology;  Laterality: Right;   INTRAOPERATIVE TRANSESOPHAGEAL ECHOCARDIOGRAM N/A 11/15/2013   Procedure: INTRAOPERATIVE TRANSESOPHAGEAL ECHOCARDIOGRAM;  Surgeon: Gaye Pollack, MD;  Location: [facility] OR;  Service: Open Heart Surgery;  Laterality: N/A;   LEFT HEART CATHETERIZATION WITH CORONARY ANGIOGRAM N/A 11/02/2013   Procedure: LEFT HEART CATHETERIZATION WITH CORONARY ANGIOGRAM;  Surgeon: Laverda Page, MD;  Location: [facility] CATH LAB;  Service: Cardiovascular;  Laterality: N/A;       Home Medications    Prior to Admission medications   Medication Sig Start Date End Date Taking? Authorizing Provider  aspirin 81 MG chewable tablet Chew 81 mg by mouth 2 (two) times daily.    [provider]  atorvastatin (LIPITOR) 10 MG tablet TAKE 1 TABLET DAILY 07/22/21   Adrian Prows, MD  finasteride (PROSCAR) 5 MG tablet TAKE 1 TABLET DAILY 06/11/21   Vaillancourt, Aldona Bar, PA-C  olmesartan-hydrochlorothiazide (BENICAR HCT) 40-25 MG tablet Take 1 tablet by mouth every morning. 07/31/20   Adrian Prows, MD  traZODone (DESYREL) 50 MG tablet Take 150 mg by mouth  at bedtime. 07/12/21   [provider]  triamcinolone cream (KENALOG) 0.1 % Apply 1 application topically daily as needed (itching).     [provider]    Family History Family History  Problem Relation Age of Onset   Prostate cancer Father    Heart attack Mother    Hypertension Brother    Coronary artery disease Brother    Kidney disease Neg Hx    Kidney cancer Neg Hx    Bladder Cancer Neg Hx     Social History Social History   Tobacco Use   Smoking status:  Former    Types: Cigars, Pipe   Smokeless tobacco: Never   Tobacco comments:    quit 50 years  Vaping Use   Vaping Use: Never used  Substance Use Topics   Alcohol use: Yes    Comment: Rarely- beer, glass of wine    Drug use: No     Allergies   Patient has no known allergies.   Review of Systems Review of Systems Pertinent negatives listed in HPI   Physical Exam Triage Vital Signs ED Triage Vitals  Enc Vitals Group     BP 07/27/21 0935 127/65     Pulse Rate 07/27/21 0935 60     Resp 07/27/21 0935 16     Temp 07/27/21 0935 98 F (36.7 C)     Temp Source 07/27/21 0935 Oral     SpO2 07/27/21 0935 98 %     Weight --      Height --      Head Circumference --      Peak Flow --      Pain Score 07/27/21 0933 0     Pain Loc --      Pain Edu? --      Excl. in [place]? --    No data found.  Updated Vital Signs BP 127/65 (BP Location: Left Arm)    Pulse 60    Temp 98 F (36.7 C) (Oral)    Resp 16    SpO2 98%   Visual Acuity Right Eye Distance:   Left Eye Distance:   Bilateral Distance:    Right Eye Near:   Left Eye Near:    Bilateral Near:     Physical Exam Constitutional:      Appearance: Normal appearance.  HENT:     Head: Normocephalic and atraumatic.  Cardiovascular:     Rate and Rhythm: Normal rate and regular rhythm.  Pulmonary:     Effort: Pulmonary effort is normal.     Breath sounds: Normal breath sounds.  Skin:    General: Skin is warm.     Capillary Refill: Capillary refill takes less than 2 seconds.     Comments: Sanguinous skin tear with isolated abrasions distal to skin tear   Neurological:     General: No focal deficit present.     Mental Status: He is alert and oriented to person, place, and time.  Psychiatric:        Mood and Affect: Mood normal.        Behavior: Behavior normal.        Thought Content: Thought content normal.        Judgment: Judgment normal.     UC Treatments / Results  Labs (all labs ordered are listed, but only  abnormal results are displayed) Labs Reviewed - No data to display  EKG   Radiology No results found.  Procedures Wound Care  Date/Time: 07/27/2021  10:14 AM Performed by: Scot Jun, FNP Authorized by: Scot Jun, FNP   Consent:    Consent obtained:  Verbal   Risks discussed:  Bleeding   Alternatives discussed:  No treatment Universal protocol:    Patient identity confirmed:  Verbally with patient Sedation:    Sedation type:  None Procedure details:    Indications: open wounds     Wound location:  Hand   Hand location:  R wrist   Wound age (days):  5   Wound surface area (sq cm):  2 Skin layer closed with:    Wound care performed:  Nothing Dressing:    Dressing applied:  Kerlix, Vaseline gauze and occlusive   Wrapped with:  Coban 4 inch Post-procedure details:    Procedure completion:  Tolerated (including critical care time)  Medications Ordered in UC Medications - No data to display  Initial Impression / Assessment and Plan / UC Course  I have reviewed the triage vital signs and the nursing notes.  Pertinent labs & imaging results that were available during my care of the patient were reviewed by me and considered in my medical decision making (see chart for details).    Wound care performed. Monitor for signs of infection. Change dressing every 24 hours or when soiled. RTC PRN Final Clinical Impressions(s) / UC Diagnoses   Final diagnoses:  Tear of skin of right wrist, initial encounter     Discharge Instructions      Change dressing once daily (every 24 hours) or sooner if soiled. The skin tear may take up to 7-10 days to completely heal and stop bleeding. Return if any signs of infection develop.     ED Prescriptions   None    PDMP not reviewed this encounter.   Scot Jun, [place] 07/28/21 [phone]

## 2021-07-27 NOTE — Discharge Instructions (Signed)
Change dressing once daily (every 24 hours) or sooner if soiled. The skin tear may take up to 7-10 days to completely heal and stop bleeding. Return if any signs of infection develop.

## 2021-07-27 NOTE — ED Triage Notes (Signed)
Pt presents with a skin tear on his right wrist x 3 days.

## 2021-07-31 ENCOUNTER — Other Ambulatory Visit: Payer: Self-pay | Admitting: Physician Assistant

## 2021-07-31 DIAGNOSIS — N138 Other obstructive and reflux uropathy: Secondary | ICD-10-CM

## 2021-07-31 DIAGNOSIS — N401 Enlarged prostate with lower urinary tract symptoms: Secondary | ICD-10-CM

## 2021-08-01 ENCOUNTER — Ambulatory Visit: Payer: Medicare HMO | Admitting: Cardiology

## 2021-08-03 ENCOUNTER — Other Ambulatory Visit: Payer: Self-pay | Admitting: Cardiology

## 2021-08-03 DIAGNOSIS — I10 Essential (primary) hypertension: Secondary | ICD-10-CM

## 2021-08-17 ENCOUNTER — Encounter: Payer: Self-pay | Admitting: Emergency Medicine

## 2021-08-17 ENCOUNTER — Ambulatory Visit
Admission: EM | Admit: 2021-08-17 | Discharge: 2021-08-17 | Disposition: A | Discharge status: Home / Self Care | Payer: Medicare HMO | Attending: Physician Assistant | Admitting: Physician Assistant

## 2021-08-17 ENCOUNTER — Other Ambulatory Visit: Payer: Self-pay

## 2021-08-17 DIAGNOSIS — H6121 Impacted cerumen, right ear: Secondary | ICD-10-CM

## 2021-08-17 DIAGNOSIS — H6123 Impacted cerumen, bilateral: Secondary | ICD-10-CM

## 2021-08-17 NOTE — ED Provider Notes (Addendum)
Willie Freeman    CSN: [id number] Arrival date & time: 08/17/21  1309      History   Chief Complaint Chief Complaint  Patient presents with   Cerumen Impaction    HPI Willie Freeman is a 76 y.o. male.   Pt presents for ear lavage.  Reports some muffled hearing bilaterally. Denies pain.  Reports he has his ears flushed about once per year due to recurrent cerumen impaction.  He has tried nothing at home.    Past Medical History:  Diagnosis Date   Back pain    Borderline diabetes mellitus 11/09/2015   BPH (benign prostatic hypertrophy)    Dysrhythmia    Elevated PSA    Heart murmur    History of elevated PSA 05/21/2015   Hydrocele    Hypertension    Myocardial infarction [facility])    Nocturia    Stroke (HCC)    ministroke   TIA (transient ischemic attack) 11/02/2013   Urinary frequency     Patient Active Problem List   Diagnosis Date Noted   Essential hypertension 12/30/2017   Thrombocytopenia (HCC) 05/20/2017   Pure hypercholesterolemia 02/10/2017   History of normocytic normochromic anemia 11/09/2015   Borderline diabetes mellitus 11/09/2015   Vaccine counseling 11/08/2015   BPH with obstruction/lower urinary tract symptoms 05/21/2015   History of elevated PSA 05/21/2015   Primary insomnia 09/04/2014   S/P AVR 11/15/2013   TIA (transient ischemic attack) 11/02/2013    Past Surgical History:  Procedure Laterality Date   ABDOMINAL AORTIC ANEURYSM REPAIR  06/19/2020   Endovascular aneurysm repairGore Excluder [id number] Contralateral right iliac limb, 14x10    AORTIC VALVE REPLACEMENT N/A 11/15/2013   Procedure: AORTIC VALVE REPLACEMENT (AVR);  Surgeon: Alleen Borne, MD;  Location: [facility] OR;  Service: Open Heart Surgery;  Laterality: N/A;   COLONOSCOPY WITH PROPOFOL N/A 02/15/2016   Procedure: COLONOSCOPY WITH PROPOFOL;  Surgeon: Christena Deem, MD;  [facility] ENDOSCOPY;  Service: Endoscopy;  Laterality: N/A;   HERNIA REPAIR  1954   bilateral -  inguinal    HYDROCELE EXCISION Right 01/17/2019   Procedure: HYDROCELECTOMY ADULT;  Surgeon: Riki Altes, MD;  Location: [facility] ORS;  Service: Urology;  Laterality: Right;   INTRAOPERATIVE TRANSESOPHAGEAL ECHOCARDIOGRAM N/A 11/15/2013   Procedure: INTRAOPERATIVE TRANSESOPHAGEAL ECHOCARDIOGRAM;  Surgeon: Alleen Borne, MD;  [facility];  Service: Open Heart Surgery;  Laterality: N/A;   LEFT HEART CATHETERIZATION WITH CORONARY ANGIOGRAM N/A 11/02/2013   Procedure: LEFT HEART CATHETERIZATION WITH CORONARY ANGIOGRAM;  Surgeon: Pamella Pert, MD;  Location: [facility] CATH LAB;  Service: Cardiovascular;  Laterality: N/A;       Home Medications    Prior to Admission medications   Medication Sig Start Date End Date Taking? Authorizing Provider  aspirin 81 MG chewable tablet Chew 81 mg by mouth 2 (two) times daily.    [provider]  atorvastatin (LIPITOR) 10 MG tablet TAKE 1 TABLET DAILY 07/22/21   Yates Decamp, MD  finasteride (PROSCAR) 5 MG tablet TAKE 1 TABLET DAILY 06/11/21   Carman Ching, PA-C  olmesartan-hydrochlorothiazide (BENICAR HCT) 40-25 MG tablet TAKE 1 TABLET EVERY MORNING 08/06/21   Yates Decamp, MD  traZODone (DESYREL) 50 MG tablet Take 150 mg by mouth at bedtime. 07/12/21   [provider]  triamcinolone cream (KENALOG) 0.1 % Apply 1 application topically daily as needed (itching).     [provider]    Family History Family History  Problem Relation Age of Onset  Prostate cancer Father    Heart attack Mother    Hypertension Brother    Coronary artery disease Brother    Kidney disease Neg Hx    Kidney cancer Neg Hx    Bladder Cancer Neg Hx     Social History Social History   Tobacco Use   Smoking status: Former    Types: Cigars, Pipe   Smokeless tobacco: Never   Tobacco comments:    quit 50 years  Vaping Use   Vaping Use: Never used  Substance Use Topics   Alcohol use: Yes    Comment: Rarely- beer, glass of wine    Drug  use: No     Allergies   Patient has no known allergies.   Review of Systems Review of Systems  Constitutional:  Negative for chills and fever.  HENT:  Negative for ear pain and sore throat.   Eyes:  Negative for pain and visual disturbance.  Respiratory:  Negative for cough and shortness of breath.   Cardiovascular:  Negative for chest pain and palpitations.  Gastrointestinal:  Negative for abdominal pain and vomiting.  Genitourinary:  Negative for dysuria and hematuria.  Musculoskeletal:  Negative for arthralgias and back pain.  Skin:  Negative for color change and rash.  Neurological:  Negative for seizures and syncope.  All other systems reviewed and are negative.   Physical Exam Triage Vital Signs ED Triage Vitals  Enc Vitals Group     BP 08/17/21 1335 129/70     Pulse Rate 08/17/21 1335 64     Resp 08/17/21 1335 20     Temp 08/17/21 1335 98 F (36.7 C)     Temp src --      SpO2 08/17/21 1335 99 %     Weight --      Height --      Head Circumference --      Peak Flow --      Pain Score 08/17/21 1336 0     Pain Loc --      Pain Edu? --      Excl. in GC? --    No data found.  Updated Vital Signs BP 129/70   Pulse 64   Temp 98 F (36.7 C)   Resp 20   SpO2 99%   Visual Acuity Right Eye Distance:   Left Eye Distance:   Bilateral Distance:    Right Eye Near:   Left Eye Near:    Bilateral Near:     Physical Exam Vitals and nursing note reviewed.  Constitutional:      General: He is not in acute distress.    Appearance: He is well-developed.  HENT:     Head: Normocephalic and atraumatic.     Right Ear: There is impacted cerumen.     Left Ear: There is no impacted cerumen.  Eyes:     Conjunctiva/sclera: Conjunctivae normal.  Cardiovascular:     Rate and Rhythm: Normal rate and regular rhythm.     Heart sounds: No murmur heard. Pulmonary:     Effort: Pulmonary effort is normal. No respiratory distress.     Breath sounds: Normal breath sounds.   Abdominal:     Palpations: Abdomen is soft.     Tenderness: There is no abdominal tenderness.  Musculoskeletal:        General: No swelling.     Cervical back: Neck supple.  Skin:    General: Skin is warm and dry.     Capillary  Refill: Capillary refill takes less than 2 seconds.  Neurological:     Mental Status: He is alert.  Psychiatric:        Mood and Affect: Mood normal.     UC Treatments / Results  Labs (all labs ordered are listed, but only abnormal results are displayed) Labs Reviewed - No data to display  EKG   Radiology No results found.  Procedures Procedures (including critical care time)  Medications Ordered in UC Medications - No data to display  Initial Impression / Assessment and Plan / UC Course  I have reviewed the triage vital signs and the nursing notes.  Pertinent labs & imaging results that were available during my care of the patient were reviewed by me and considered in my medical decision making (see chart for details).     Right ear cerumen impaction, minimal cerumen noted to left ear with normal TM visualized. Ear lavage bilaterally with removal of all cerumen.  Right TM normal.  Advised follow up with PCP.  Final Clinical Impressions(s) / UC Diagnoses   Final diagnoses:  Impacted cerumen of right ear     Discharge Instructions      Return for evaluation if you develop any pain or trouble hearing or follow up with your Primary Care Physician      ED Prescriptions   None    PDMP not reviewed this encounter.   Ward, Tylene Fantasia, PA-C 08/17/21 1351    Ward, Tylene Fantasia, PA-C 09/13/21 1119

## 2021-08-17 NOTE — Discharge Instructions (Addendum)
Return for evaluation if you develop any pain or trouble hearing or follow up with your Primary Care Physician

## 2021-08-17 NOTE — ED Triage Notes (Addendum)
Pt here with right cerumen impaction.

## 2021-08-19 NOTE — Progress Notes (Signed)
Primary Physician/Referring:  Dion Body, MD  Patient ID: Willie Freeman, male    DOB: 13-Sep-1945, 76 y.o.   MRN: [id number]  Chief Complaint  Patient presents with   AV Repair   Hypertension   Follow-up    1 year   HPI:    Willie Freeman  is a 76 y.o. Caucasian male with Aortic Valvular heart disease S/P Bioprosthetic AVR, hyperlipidemia and HTN, H/O NSTEMI on  11/02/2013 and also dysarthria and confusional state and was diagnosed as having TIA.  His symptoms resolved, was taken emergently to the cath lab, was found to have no significant coronary artery disease but was found to have critical aortic stenosis. He underwent aortic valve replacement on 11/15/2013.    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  Underwent endovascular repair on 06/19/2020 at [facility] by  Dr. Montel Culver.  Patient presents for annual follow-up.  Last office visit patient was stable from a cardiovascular standpoint, therefore no changes were made.  Patient remains active continues to work.  He remains asymptomatic without recurrence of abdominal discomfort.  Denies chest pain or dyspnea.  Past Medical History:  Diagnosis Date   Back pain    Borderline diabetes mellitus 11/09/2015   BPH (benign prostatic hypertrophy)    Dysrhythmia    Elevated PSA    Heart murmur    History of elevated PSA 05/21/2015   Hydrocele    Hypertension    Myocardial infarction [facility])    Nocturia    Stroke ([place])    ministroke   TIA (transient ischemic attack) 11/02/2013   Urinary frequency    Family History  Problem Relation Age of Onset   Prostate cancer Father    Heart attack Mother    Hypertension Brother    Coronary artery disease Brother    Kidney disease Neg Hx    Kidney cancer Neg Hx    Bladder Cancer Neg Hx    Social History    Tobacco Use   Smoking status: Former    Types: Cigars, Pipe   Smokeless tobacco: Never   Tobacco comments:    quit 60 years  Substance Use Topics   Alcohol use: Yes    Comment: Rarely- beer, glass of wine    Marital Status: Married   ROS  Review of Systems  Cardiovascular:  Negative for chest pain, claudication, dyspnea on exertion, leg swelling, near-syncope, orthopnea, palpitations, paroxysmal nocturnal dyspnea and syncope.  Respiratory:  Negative for shortness of breath.   Gastrointestinal:  Negative for melena.  Neurological:  Negative for dizziness.   Objective  Blood pressure 117/65, pulse (!) 57, temperature 98.3 F (36.8 C), temperature source Temporal, resp. rate 16, height 5' 11"  (1.803 m), weight 160 lb 12.8 oz (72.9 kg), SpO2 95 %.  Vitals with BMI 08/20/2021 08/17/2021 07/27/2021  Height 5' 11"  - -  Weight 160 lbs 13 oz - -  BMI 44.62 - -  Systolic 863 817 711  Diastolic 65 70 65  Pulse 57 64 60     Physical Exam Vitals reviewed.  Constitutional:      Appearance: He is well-developed.  Neck:     Thyroid: No thyromegaly.     Vascular: No JVD.  Cardiovascular:     Rate and Rhythm: Normal rate and regular rhythm.     Pulses:  Carotid pulses are  on the right side with bruit and  on the left side with bruit.      Femoral pulses are 2+ on the right side and 2+ on the left side.      Popliteal pulses are 2+ on the right side and 2+ on the left side.       Dorsalis pedis pulses are 2+ on the right side and 1+ on the left side.       Posterior tibial pulses are 2+ on the right side and 1+ on the left side.     Heart sounds: S1 normal and S2 normal. Murmur heard.  Midsystolic murmur is present with a grade of 2/6 radiating to the neck.    No gallop.  Pulmonary:     Effort: Pulmonary effort is normal.     Breath sounds: Normal breath sounds.  Musculoskeletal:     Right lower leg: No edema.     Left lower leg: No edema.   Laboratory examination:   No  results for input(s): NA, K, CL, CO2, GLUCOSE, BUN, CREATININE, CALCIUM, GFRNONAA, GFRAA in the last 8760 hours.  CrCl cannot be calculated (Patient's most recent lab result is older than the maximum 21 days allowed.).  CMP Latest Ref Rng & Units 05/23/2020 10/29/2014 11/18/2013  Glucose 70 - 99 mg/dL 120(H) 122(H) 117(H)  BUN 8 - 23 mg/dL 21 21(H) 19  Creatinine 0.61 - 1.24 mg/dL 0.89 1.04 0.76  Sodium 135 - 145 mmol/L 138 143 137  Potassium 3.5 - 5.1 mmol/L 3.7 3.9 4.1  Chloride 98 - 111 mmol/L 101 108 101  CO2 22 - 32 mmol/L 28 30 26   Calcium 8.9 - 10.3 mg/dL 8.9 8.7(L) 8.4  Total Protein 6.5 - 8.1 g/dL 6.8 - -  Total Bilirubin 0.3 - 1.2 mg/dL 1.2 - -  Alkaline Phos 38 - 126 U/L 72 - -  AST 15 - 41 U/L 18 - -  ALT 0 - 44 U/L 17 - -   CBC Latest Ref Rng & Units 05/23/2020 02/15/2016 10/29/2014  WBC 4.0 - 10.5 K/uL 6.9 4.7 6.9  Hemoglobin 13.0 - 17.0 g/dL 12.5(L) 12.4(L) 12.8(L)  Hematocrit 39.0 - 52.0 % 37.8(L) 36.5(L) 38.7(L)  Platelets 150 - 400 K/uL 150 128(L) 144(L)   External labs: 08/01/2021: Sodium 142, potassium 3.7, BUN 26, creatinine 1.0, GFR >60, AST 18, ALT 15 A1c 5.9% Total cholesterol 121, triglycerides 57, HDL 46, LDL 64 Hgb 12.1, HCT 37.4, MCV 90.6, platelet 144  12/02/2019: Total cholesterol 118, triglycerides 77, HDL 43, LDL 59. A1c 5.9%. Serum glucose 99 mg, BUN 24, creatinine 0.9, EGFR >60 mL, CMP otherwise normal. Hb 11.8/HCT 37.1, platelets 142, stable.  Mild microcytic indicis.  Allergies  No Known Allergies   Medications Prior to Visit:   Outpatient Medications Prior to Visit  Medication Sig Dispense Refill   aspirin 81 MG chewable tablet Chew 81 mg by mouth 2 (two) times daily.     atorvastatin (LIPITOR) 10 MG tablet TAKE 1 TABLET DAILY 90 tablet 3   fluocinonide cream (LIDEX) 1.41 % Apply 1 application topically as needed.     ketoconazole (NIZORAL) 2 % shampoo Apply topically 3 (three) times a week.     tamsulosin (FLOMAX) 0.4 MG CAPS capsule Take  0.4 mg by mouth daily.     traZODone (DESYREL) 50 MG tablet Take 150 mg by mouth at bedtime.     finasteride (PROSCAR) 5 MG tablet TAKE 1 TABLET DAILY 30 tablet 0  olmesartan-hydrochlorothiazide (BENICAR HCT) 40-25 MG tablet TAKE 1 TABLET EVERY MORNING 90 tablet 3   triamcinolone cream (KENALOG) 0.1 % Apply 1 application topically daily as needed (itching).      No facility-administered medications prior to visit.   Final Medications at End of Visit    Current Meds  Medication Sig   aspirin 81 MG chewable tablet Chew 81 mg by mouth 2 (two) times daily.   atorvastatin (LIPITOR) 10 MG tablet TAKE 1 TABLET DAILY   fluocinonide cream (LIDEX) 4.43 % Apply 1 application topically as needed.   ketoconazole (NIZORAL) 2 % shampoo Apply topically 3 (three) times a week.   tamsulosin (FLOMAX) 0.4 MG CAPS capsule Take 0.4 mg by mouth daily.   traZODone (DESYREL) 50 MG tablet Take 150 mg by mouth at bedtime.   Radiology:   CT of the abdomen and pelvis 05/24/2020: Mild Peri duodenal inflammatory change involving the second and third portion of the duodenum suggesting changes of a mild duodenitis. No evidence of obstruction or perforation.   Fusiform 3.0 cm infrarenal abdominal aortic aneurysm. 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 Given its size, vascular consultation is recommended for further management.   Moderate prostatic enlargement. Mild bladder wall thickening suggest changes of a mild bladder outlet obstruction.   Moderate fat containing right indirect inguinal hernia.   Cardiac Studies:   Coronary angiogram 11/02/2013: Severe aortic stenosis, no significant coronary artery disease.  Minimal calcification in LAD otherwise normal.  Normal left ventricle systolic function.  Carotid Doppler   06/09/2017: No hemodynamically significant arterial disease in the internal carotid artery bilaterally.  There is mild soft plaque in bilateral carotid bulbs. Antegrade right vertebral artery flow. Antegrade left vertebral artery flow.  Echocardiogram 07/20/2018: Left ventricle cavity is normal in size. Moderate concentric hypertrophy of the left ventricle. Normal global wall motion. Normal diastolic filling pattern. Calculated EF 55%. Left atrial cavity is mildly dilated at 4.1 cm. Right atrial cavity is mildly dilated. Bioprosthetic aortic valve with no regurgitation noted. Trace aortic valve stenosis. Aortic valve peak pressure gradient of 18 and mean gradient of 10 mmHg, calculated aortic valve area 1.37 cm. Mild mitral valve leaflet thickening. Mild (Grade I) mitral regurgitation. Mild tricuspid regurgitation. No evidence of pulmonary hypertension. Overall there is no significant change since 12/18/2015.  Endovascular abdominal aortic aneurysm repair 06/19/2020: Simeon Craft Excluder [phone] Contralateral right iliac limb, 14x10 at [facility]  EKG  08/20/2021: Sinus rhythm at a rate of 58 bpm.  Normal axis.  Poor R wave progression, cannot exclude anteroseptal infarct old. LVH. No evidence of ischemia or underlying injury pattern.  Compared EKG 06/04/2020, no significant change.  Assessment     ICD-10-CM   1. H/O aortic valve replacement with porcine valve  Z95.3 EKG 12-Lead    PCV ECHOCARDIOGRAM COMPLETE    2. Status post percutaneous abdominal aortic aneurysm (AAA) repair: Gore Excluder [phone] Contralateral right iliac limb, 14x10 06/19/2020 at [facility]  Z98.890 PCV AORTA DUPLEX   Z86.79     3. Pure hypercholesterolemia  E78.00     4. Essential hypertension  I10       No orders of the defined types were placed in this encounter.  Medications Discontinued During This Encounter  Medication Reason   finasteride (PROSCAR) 5 MG tablet    olmesartan-hydrochlorothiazide (BENICAR HCT) 40-25 MG tablet    triamcinolone cream (KENALOG) 0.1 %      No orders of the  defined types were  placed in this encounter.   Recommendations:    Willie Freeman  is a 76 y.o.  Caucasian male with Aortic Valvular heart disease S/P Bioprosthetic AVR (10/2013) due to critical aortic stenosis, hyperlipidemia and HTN, H/O NSTEMI on  11/02/2013, TIA (2015).  Found to have penetrating abdominal aortic aneurysm and subsequently underwent successful repair and CT in 05/2020.  Patient presents for annual follow-up.  Patient remains asymptomatic.  Blood pressure is well controlled.  I personally reviewed external labs, lipids are well controlled and renal function is stable.  Given that it has been 3 years since last echocardiogram we will obtain repeat echo to reevaluate valvular disease.  We will also obtain aortic duplex given history of abdominal aortic aneurysm repair.  Also discussed with patient repeat ischemic evaluation as patient was wondering if he would need CAD reevaluated.  As he remains asymptomatic shared decision was to hold off on this test at this time.  Patient is otherwise stable from a cardiovascular standpoint.  Physical exam and EKG remain unchanged compared to previous.  Follow-up in 1 year, sooner if needed.   Alethia Berthold, PA-C 08/20/2021, 3:33 PM Office: [phone]

## 2021-08-20 ENCOUNTER — Encounter: Payer: Self-pay | Admitting: Cardiology

## 2021-08-20 ENCOUNTER — Other Ambulatory Visit: Payer: Self-pay

## 2021-08-20 ENCOUNTER — Ambulatory Visit: Payer: Medicare HMO | Admitting: Cardiology

## 2021-08-20 VITALS — BP 117/65 | HR 57 | Temp 98.3°F | Resp 16 | Ht 71.0 in | Wt 160.8 lb

## 2021-08-20 DIAGNOSIS — Z953 Presence of xenogenic heart valve: Secondary | ICD-10-CM

## 2021-08-20 DIAGNOSIS — Z8679 Personal history of other diseases of the circulatory system: Secondary | ICD-10-CM

## 2021-08-20 DIAGNOSIS — I10 Essential (primary) hypertension: Secondary | ICD-10-CM

## 2021-08-20 DIAGNOSIS — Z9889 Other specified postprocedural states: Secondary | ICD-10-CM

## 2021-08-20 DIAGNOSIS — E7800 Pure hypercholesterolemia, unspecified: Secondary | ICD-10-CM

## 2022-02-17 ENCOUNTER — Other Ambulatory Visit: Payer: Medicare HMO

## 2022-02-19 ENCOUNTER — Other Ambulatory Visit: Payer: Medicare HMO

## 2022-02-26 ENCOUNTER — Encounter: Payer: Self-pay | Admitting: Cardiology

## 2022-02-27 ENCOUNTER — Encounter: Payer: Self-pay | Admitting: Cardiology

## 2022-03-18 ENCOUNTER — Other Ambulatory Visit: Payer: Medicare HMO

## 2022-03-21 ENCOUNTER — Ambulatory Visit: Payer: Medicare HMO

## 2022-03-21 DIAGNOSIS — Z8679 Personal history of other diseases of the circulatory system: Secondary | ICD-10-CM

## 2022-03-21 DIAGNOSIS — Z953 Presence of xenogenic heart valve: Secondary | ICD-10-CM

## 2022-03-26 ENCOUNTER — Ambulatory Visit
Admission: EM | Admit: 2022-03-26 | Discharge: 2022-03-26 | Disposition: A | Discharge status: Home / Self Care | Payer: Medicare HMO

## 2022-03-26 DIAGNOSIS — T148XXA Other injury of unspecified body region, initial encounter: Secondary | ICD-10-CM | POA: Diagnosis not present

## 2022-03-26 NOTE — ED Provider Notes (Signed)
[facility] Willie Freeman    CSN: [id number] Arrival date & time: 03/26/22  1450      History   Chief Complaint Chief Complaint  Patient presents with   Leg Pain    HPI Willie Freeman is a 76 y.o. male.    Leg Pain   Presents to urgent care with report of injury to his left ankle which occurred last Thursday (6 days).  Patient states he bumped his left ankle while moving.  He endorses initial bleeding which was stopped after he bandaged it.  Endorses continued redness and swelling at the site.  Patient uses daily low-dose aspirin.  Past Medical History:  Diagnosis Date   Back pain    Borderline diabetes mellitus 11/09/2015   BPH (benign prostatic hypertrophy)    Dysrhythmia    Elevated PSA    Heart murmur    History of elevated PSA 05/21/2015   Hydrocele    Hypertension    Myocardial infarction [facility])    Nocturia    Stroke ([place])    ministroke   TIA (transient ischemic attack) 11/02/2013   Urinary frequency     Patient Active Problem List   Diagnosis Date Noted   H/O abdominal aortic aneurysm repair 07/30/2020   Essential hypertension 12/30/2017   Thrombocytopenia ([place]) 05/20/2017   Pure hypercholesterolemia 02/10/2017   History of normocytic normochromic anemia 11/09/2015   Borderline diabetes mellitus 11/09/2015   Vaccine counseling 11/08/2015   BPH with obstruction/lower urinary tract symptoms 05/21/2015   History of elevated PSA 05/21/2015   Primary insomnia 09/04/2014   S/P AVR 11/15/2013   TIA (transient ischemic attack) 11/02/2013    Past Surgical History:  Procedure Laterality Date   ABDOMINAL AORTIC ANEURYSM REPAIR  06/19/2020   Endovascular aneurysm repairGore Excluder [id number] Contralateral right iliac limb, 14x10    AORTIC VALVE REPLACEMENT N/A 11/15/2013   Procedure: AORTIC VALVE REPLACEMENT (AVR);  Surgeon: Gaye Pollack, MD;  Location: [place];  Service: Open Heart Surgery;  Laterality: N/A;   COLONOSCOPY WITH PROPOFOL N/A 02/15/2016   Procedure:  COLONOSCOPY WITH PROPOFOL;  Surgeon: Lollie Sails, MD;  Location: [facility] ENDOSCOPY;  Service: Endoscopy;  Laterality: N/A;   HERNIA REPAIR  1954   bilateral - inguinal    HYDROCELE EXCISION Right 01/17/2019   Procedure: HYDROCELECTOMY ADULT;  Surgeon: Abbie Sons, MD;  Location: [facility] ORS;  Service: Urology;  Laterality: Right;   INTRAOPERATIVE TRANSESOPHAGEAL ECHOCARDIOGRAM N/A 11/15/2013   Procedure: INTRAOPERATIVE TRANSESOPHAGEAL ECHOCARDIOGRAM;  Surgeon: Gaye Pollack, MD;  Location: [facility] OR;  Service: Open Heart Surgery;  Laterality: N/A;   LEFT HEART CATHETERIZATION WITH CORONARY ANGIOGRAM N/A 11/02/2013   Procedure: LEFT HEART CATHETERIZATION WITH CORONARY ANGIOGRAM;  Surgeon: Laverda Page, MD;  Location: [facility] CATH LAB;  Service: Cardiovascular;  Laterality: N/A;       Home Medications    Prior to Admission medications   Medication Sig Start Date End Date Taking? Authorizing Provider  amoxicillin (AMOXIL) 500 MG capsule Take 1,000 mg by mouth 2 (two) times daily. 03/25/22   [provider]  aspirin 81 MG chewable tablet Chew 81 mg by mouth 2 (two) times daily.    [provider]  atorvastatin (LIPITOR) 10 MG tablet TAKE 1 TABLET DAILY 07/22/21   Adrian Prows, MD  fluocinonide cream (LIDEX) 8.12 % Apply 1 application topically as needed. 08/15/21   [provider]  ketoconazole (NIZORAL) 2 % shampoo Apply topically 3 (three) times a week. 08/15/21   [provider]  olmesartan-hydrochlorothiazide (BENICAR HCT) 40-25 MG tablet Take 1 tablet by mouth daily. 02/16/22   [provider]  omeprazole (PRILOSEC OTC) 20 MG tablet Take by mouth.    [provider]  tamsulosin (FLOMAX) 0.4 MG CAPS capsule Take 0.4 mg by mouth daily.    [provider]  traZODone (DESYREL) 50 MG tablet Take 150 mg by mouth at bedtime. 07/12/21   [provider]    Family History Family History  Problem Relation Age of Onset   Prostate  cancer Father    Heart attack Mother    Hypertension Brother    Coronary artery disease Brother    Kidney disease Neg Hx    Kidney cancer Neg Hx    Bladder Cancer Neg Hx     Social History Social History   Tobacco Use   Smoking status: Former    Types: Cigars, Pipe   Smokeless tobacco: Never   Tobacco comments:    quit 60 years  Vaping Use   Vaping Use: Never used  Substance Use Topics   Alcohol use: Yes    Comment: Rarely- beer, glass of wine    Drug use: No     Allergies   Patient has no known allergies.   Review of Systems Review of Systems   Physical Exam Triage Vital Signs ED Triage Vitals [03/26/22 1516]  Enc Vitals Group     BP 133/71     Pulse Rate 60     Resp 14     Temp 98 F (36.7 C)     Temp src      SpO2 96 %     Weight      Height      Head Circumference      Peak Flow      Pain Score 7     Pain Loc      Pain Edu?      Excl. in [place]?    No data found.  Updated Vital Signs BP 133/71   Pulse 60   Temp 98 F (36.7 C)   Resp 14   SpO2 96%   Visual Acuity Right Eye Distance:   Left Eye Distance:   Bilateral Distance:    Right Eye Near:   Left Eye Near:    Bilateral Near:     Physical Exam Constitutional:      Appearance: Normal appearance.  Musculoskeletal:       Legs:  Skin:    General: Skin is warm and dry.  Neurological:     General: No focal deficit present.     Mental Status: He is alert and oriented to person, place, and time.  Psychiatric:        Mood and Affect: Mood normal.        Behavior: Behavior normal.      UC Treatments / Results  Labs (all labs ordered are listed, but only abnormal results are displayed) Labs Reviewed - No data to display  EKG   Radiology No results found.  Procedures Procedures (including critical care time)  Medications Ordered in UC Medications - No data to display  Initial Impression / Assessment and Plan / UC Course  I have reviewed the triage vital signs and the  nursing notes.  Pertinent labs & imaging results that were available during my care of the patient were reviewed by me and considered in my medical decision making (see chart for details).   Ongoing inflammatory reaction with unhealed wound on left  anterior lower leg.  Suspect the poor wound healing is because of reinjury when changing dressing.  Redressed wound with antibiotic ointment covered with nonadherent dressing wrapped with 3 inch Coban.  Provided home supplies for a few days and gave instructions to change dressing, watching for signs of infection.   Final Clinical Impressions(s) / UC Diagnoses   Final diagnoses:  None   Discharge Instructions   None    ED Prescriptions   None    PDMP not reviewed this encounter.   Rose Phi, [place] 03/26/22 1537

## 2022-03-26 NOTE — Discharge Instructions (Addendum)
Change dressing daily after showering.  Wash gently with soap and water or allow soapy water to run over the wound.  Gently pat dry.  Redress with antibacterial ointment covered with nonadherent dressing wrapped with Coban.

## 2022-03-26 NOTE — ED Triage Notes (Signed)
Pt. States last Thursday, he bumped his left ankle while moving. Pt. States the site started to bleed and he bandaged it and the bleeding has since stopped. Pt. C/o swelling and redness @ the site. Pt. States he is currently on blood thinners (aspirin) and would like the site assessed.

## 2022-08-12 ENCOUNTER — Other Ambulatory Visit: Payer: Self-pay | Admitting: Cardiology

## 2022-08-12 DIAGNOSIS — E7800 Pure hypercholesterolemia, unspecified: Secondary | ICD-10-CM

## 2022-08-20 ENCOUNTER — Ambulatory Visit: Payer: Medicare HMO | Admitting: Student

## 2022-08-20 ENCOUNTER — Ambulatory Visit: Payer: Medicare HMO | Admitting: Internal Medicine

## 2022-08-20 ENCOUNTER — Encounter: Payer: Self-pay | Admitting: Internal Medicine

## 2022-08-20 VITALS — BP 136/63 | HR 51 | Ht 71.0 in | Wt 158.2 lb

## 2022-08-20 DIAGNOSIS — E7800 Pure hypercholesterolemia, unspecified: Secondary | ICD-10-CM

## 2022-08-20 DIAGNOSIS — Z953 Presence of xenogenic heart valve: Secondary | ICD-10-CM

## 2022-08-20 DIAGNOSIS — I10 Essential (primary) hypertension: Secondary | ICD-10-CM

## 2022-08-20 NOTE — Progress Notes (Signed)
Primary Physician/Referring:  Dion Body, MD  Patient ID: Willie Freeman, male    DOB: 17-Dec-1945, 77 y.o.   MRN: [id number]  Chief Complaint  Patient presents with   Coronary Artery Disease   Follow-up   HPI:    Willie Freeman  is a 77 y.o. Caucasian male with Aortic Valvular heart disease S/P Bioprosthetic AVR, hyperlipidemia and HTN, H/O NSTEMI on  11/02/2013 and also dysarthria and confusional state and was diagnosed as having TIA.  His symptoms resolved, was taken emergently to the cath lab, was found to have no significant coronary artery disease but was found to have critical aortic stenosis. He underwent aortic valve replacement on 11/15/2013.   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  Underwent endovascular repair on 06/19/2020 at [facility] by  Dr. Montel Culver.  Patient is here for his annual follow-up visit. He feels great and does not have any complaints. He is active and still working every day. He has not had any abdominal pain or bloating since his AAA repair. Patient denies chest pain, shortness of breath, palpitations, diaphoresis, syncope, edema, PND, orthopnea.   Past Medical History:  Diagnosis Date   Back pain    Borderline diabetes mellitus 11/09/2015   BPH (benign prostatic hypertrophy)    Dysrhythmia    Elevated PSA    Heart murmur    History of elevated PSA 05/21/2015   Hydrocele    Hypertension    Myocardial infarction [facility])    Nocturia    Stroke ([place])    ministroke   TIA (transient ischemic attack) 11/02/2013   Urinary frequency    Family History  Problem Relation Age of Onset   Prostate cancer Father    Heart attack Mother    Hypertension Brother    Coronary artery disease Brother    Kidney disease Neg Hx    Kidney cancer Neg Hx    Bladder Cancer Neg Hx     Social History   Tobacco Use   Smoking status: Former    Types: Cigars, Pipe   Smokeless tobacco: Never   Tobacco comments:    quit 60 years  Substance Use Topics   Alcohol use: Yes    Comment: Rarely- beer, glass of wine    Marital Status: Married   ROS  Review of Systems  Cardiovascular:  Negative for chest pain, claudication, dyspnea on exertion, leg swelling, near-syncope, orthopnea, palpitations, paroxysmal nocturnal dyspnea and syncope.  Respiratory:  Negative for shortness of breath.   Gastrointestinal:  Negative for melena.  Neurological:  Negative for dizziness.    Objective  Blood pressure 136/63, pulse (!) 51, height '5\' 11"'$  (1.803 m), weight 158 lb 3.2 oz (71.8 kg), SpO2 98 %.     08/20/2022    8:25 AM 03/26/2022    3:16 PM 08/20/2021    2:55 PM  Vitals with BMI  Height '5\' 11"'$   '5\' 11"'$   Weight 158 lbs 3 oz  160 lbs 13 oz  BMI [id number]  [id number]  Systolic [id number]  Diastolic 63 71 65  Pulse 51 60 57     Physical Exam Vitals reviewed.  Constitutional:      Appearance: He is well-developed.  Neck:     Thyroid: No thyromegaly.     Vascular: No JVD.  Cardiovascular:     Rate  and Rhythm: Normal rate and regular rhythm.     Pulses:          Carotid pulses are  on the right side with bruit and  on the left side with bruit.      Femoral pulses are 2+ on the right side and 2+ on the left side.      Popliteal pulses are 2+ on the right side and 2+ on the left side.       Dorsalis pedis pulses are 2+ on the right side and 1+ on the left side.       Posterior tibial pulses are 2+ on the right side and 1+ on the left side.     Heart sounds: S1 normal and S2 normal. Murmur heard.     Midsystolic murmur is present with a grade of 2/6 radiating to the neck.     No gallop.  Pulmonary:     Effort: Pulmonary effort is normal.     Breath sounds: Normal breath sounds.  Musculoskeletal:     Right lower leg: No edema.     Left lower leg: No edema.    Laboratory  examination:   No results for input(s): "NA", "K", "CL", "CO2", "GLUCOSE", "BUN", "CREATININE", "CALCIUM", "GFRNONAA", "GFRAA" in the last 8760 hours.  CrCl cannot be calculated (Patient's most recent lab result is older than the maximum 21 days allowed.).     Latest Ref Rng & Units 05/23/2020   10:48 PM 10/29/2014    5:42 PM 11/18/2013    5:50 AM  CMP  Glucose 70 - 99 mg/dL 120  122  117   BUN 8 - 23 mg/dL '21  21  19   '$ Creatinine 0.61 - 1.24 mg/dL 0.89  1.04  0.76   Sodium 135 - 145 mmol/L 138  143  137   Potassium 3.5 - 5.1 mmol/L 3.7  3.9  4.1   Chloride 98 - 111 mmol/L 101  108  101   CO2 22 - 32 mmol/L '28  30  26   '$ Calcium 8.9 - 10.3 mg/dL 8.9  8.7  8.4   Total Protein 6.5 - 8.1 g/dL 6.8     Total Bilirubin 0.3 - 1.2 mg/dL 1.2     Alkaline Phos 38 - 126 U/L 72     AST 15 - 41 U/L 18     ALT 0 - 44 U/L 17         Latest Ref Rng & Units 05/23/2020   10:48 PM 02/15/2016    1:29 PM 10/29/2014    5:42 PM  CBC  WBC 4.0 - 10.5 K/uL 6.9  4.7  6.9   Hemoglobin 13.0 - 17.0 g/dL 12.5  12.4  12.8   Hematocrit 39.0 - 52.0 % 37.8  36.5  38.7   Platelets 150 - 400 K/uL 150  128  144    External labs: 08/01/2021: Sodium 142, potassium 3.7, BUN 26, creatinine 1.0, GFR >60, AST 18, ALT 15 A1c 5.9% Total cholesterol 121, triglycerides 57, HDL 46, LDL 64 Hgb 12.1, HCT 37.4, MCV 90.6, platelet 144  12/02/2019: Total cholesterol 118, triglycerides 77, HDL 43, LDL 59. A1c 5.9%. Serum glucose 99 mg, BUN 24, creatinine 0.9, EGFR >60 mL, CMP otherwise normal. Hb 11.8/HCT 37.1, platelets 142, stable.  Mild microcytic indicis.  Allergies  No Known Allergies   Medications Prior to Visit:   Outpatient Medications Prior to Visit  Medication Sig Dispense Refill   aspirin 81 MG chewable tablet  Chew 81 mg by mouth 2 (two) times daily.     atorvastatin (LIPITOR) 10 MG tablet TAKE 1 TABLET DAILY 90 tablet 3   augmented betamethasone dipropionate (DIPROLENE-AF) 0.05 % cream Apply topically 2 (two)  times daily.     ketoconazole (NIZORAL) 2 % shampoo Apply topically 3 (three) times a week.     olmesartan-hydrochlorothiazide (BENICAR HCT) 20-12.5 MG tablet Take 1 tablet by mouth daily.     traZODone (DESYREL) 50 MG tablet Take 150 mg by mouth at bedtime.     amoxicillin (AMOXIL) 500 MG capsule Take 1,000 mg by mouth 2 (two) times daily.     fluocinonide cream (LIDEX) [id number] % Apply 1 application topically as needed.     olmesartan-hydrochlorothiazide (BENICAR HCT) 40-25 MG tablet Take 1 tablet by mouth daily.     omeprazole (PRILOSEC OTC) 20 MG tablet Take by mouth. (Patient not taking: Reported on 08/20/2022)     tamsulosin (FLOMAX) 0.4 MG CAPS capsule Take 0.4 mg by mouth daily. (Patient not taking: Reported on 08/20/2022)     No facility-administered medications prior to visit.   Final Medications at End of Visit    Current Meds  Medication Sig   aspirin 81 MG chewable tablet Chew 81 mg by mouth 2 (two) times daily.   atorvastatin (LIPITOR) 10 MG tablet TAKE 1 TABLET DAILY   augmented betamethasone dipropionate (DIPROLENE-AF) 0.05 % cream Apply topically 2 (two) times daily.   ketoconazole (NIZORAL) 2 % shampoo Apply topically 3 (three) times a week.   olmesartan-hydrochlorothiazide (BENICAR HCT) 20-12.5 MG tablet Take 1 tablet by mouth daily.   traZODone (DESYREL) 50 MG tablet Take 150 mg by mouth at bedtime.   Radiology:   CT of the abdomen and pelvis 05/24/2020: Mild Peri duodenal inflammatory change involving the second and third portion of the duodenum suggesting changes of a mild duodenitis. No evidence of obstruction or perforation.   Fusiform 3.0 cm infrarenal abdominal aortic aneurysm. 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 Given its size, vascular consultation is recommended for further management.   Moderate prostatic enlargement. Mild bladder wall thickening suggest changes of a  mild bladder outlet obstruction.   Moderate fat containing right indirect inguinal hernia.   Cardiac Studies:   Coronary angiogram 11/02/2013: Severe aortic stenosis, no significant coronary artery disease.  Minimal calcification in LAD otherwise normal.  Normal left ventricle systolic function.  Carotid Doppler   06/09/2017: No hemodynamically significant arterial disease in the internal carotid artery bilaterally. There is mild soft plaque in bilateral carotid bulbs. Antegrade right vertebral artery flow. Antegrade left vertebral artery flow.  Echocardiogram 07/20/2018: Left ventricle cavity is normal in size. Moderate concentric hypertrophy of the left ventricle. Normal global wall motion. Normal diastolic filling pattern. Calculated EF 55%. Left atrial cavity is mildly dilated at 4.1 cm. Right atrial cavity is mildly dilated. Bioprosthetic aortic valve with no regurgitation noted. Trace aortic valve stenosis. Aortic valve peak pressure gradient of 18 and mean gradient of 10 mmHg, calculated aortic valve area 1.37 cm. Mild mitral valve leaflet thickening. Mild (Grade I) mitral regurgitation. Mild tricuspid regurgitation. No evidence of pulmonary hypertension. Overall there is no significant change since 12/18/2015.  Endovascular abdominal aortic aneurysm repair 06/19/2020: Gore Excluder [phone] Contralateral right iliac limb, 14x10 at [place]  Abdominal Aortic Duplex 03/21/2022: The maximum aorta (sac) diameter is 2.44 cm (dist). Moderate heterogeneous plaque observed in the mid and distal aorta. The aorta  waveform demonstrates a multiphasic flow pattern.    Echocardiogram 03/21/2022:  Normal LV systolic function with visual EF 60-65%. Left ventricle cavity  is normal in size. Normal global wall motion. Mild left ventricular  hypertrophy. Unable to evaluate diastolic function due to suboptimal image  acquisition.  Left atrial cavity is mildly dilated at 37.82 ml/m^2.   Right atrial cavity is mildly dilated.  Right ventricle cavity is mildly dilated. Normal right ventricular  function.  Bioprosthetic aortic valve. No evidence of aortic stenosis. No aortic  valve regurgitation.  Mild tricuspid regurgitation. No evidence of pulmonary hypertension.  Mild pulmonic regurgitation.  The aortic root is normal. Ascending aortic aneurysm, 37m.  Compared to 07/20/2018 trace aortic stenosis is not present on current  study & ascending aortic aneurysm was not mentioned on prior study.   EKG  08/20/2021: Sinus rhythm at a rate of 58 bpm.  Normal axis.  Poor R wave progression, cannot exclude anteroseptal infarct old. LVH. No evidence of ischemia or underlying injury pattern.  Compared EKG 06/04/2020, no significant change.  08/20/2022: Sinus Bradycardia, rate 48 bpm. Normal axis. Voltage criteria for LVH.  Assessment     ICD-10-CM   1. H/O aortic valve replacement with porcine valve  Z95.3 EKG 12-Lead    2. Essential hypertension  I10     3. Pure hypercholesterolemia  E78.00       No orders of the defined types were placed in this encounter.  Medications Discontinued During This Encounter  Medication Reason   olmesartan-hydrochlorothiazide (BENICAR HCT) 40-25 MG tablet Dose change   tamsulosin (FLOMAX) 0.4 MG CAPS capsule Completed Course   omeprazole (PRILOSEC OTC) 20 MG tablet Completed Course   fluocinonide cream (LIDEX) 0.05 % Completed Course     No orders of the defined types were placed in this encounter.   Recommendations:    RAUBIN MCCULLICK is a 77y.o.  Caucasian male with Aortic Valvular heart disease S/P Bioprosthetic AVR (10/2013) due to critical aortic stenosis, hyperlipidemia and HTN, H/O NSTEMI on  11/02/2013, TIA (2015).  Found to have penetrating abdominal aortic aneurysm and subsequently underwent successful repair and CT in 05/2020.  H/O aortic valve replacement with porcine valve Stable without shortness of breath, syncope, or chest  pain Patient states he feels great and has not had any problems since AVR Ascending aortic aneurysm, 462m- repeat echo in 1 year He has had AAA repair in the past   Essential hypertension Continue current cardiac medications. Encourage low-sodium diet, less than 2000 mg daily. Follow-up in 12 months or sooner if needed.   Pure hypercholesterolemia Continue lipitor 10 mg daily    SaFloydene FlockDO, FAPhysician'S Choice Hospital [facility], [facility]/28/2024, 8:40 AM Office: [phone]

## 2022-09-02 ENCOUNTER — Other Ambulatory Visit: Payer: Self-pay | Admitting: Cardiology

## 2022-09-02 DIAGNOSIS — I10 Essential (primary) hypertension: Secondary | ICD-10-CM

## 2022-09-13 ENCOUNTER — Encounter: Payer: Self-pay | Admitting: Internal Medicine

## 2022-09-15 ENCOUNTER — Other Ambulatory Visit: Payer: Self-pay

## 2022-09-15 MED ORDER — OLMESARTAN MEDOXOMIL-HCTZ 20-12.5 MG PO TABS: 1.0000 | ORAL_TABLET | Freq: Every day | ORAL | 3 refills | Status: DC

## 2022-10-26 ENCOUNTER — Ambulatory Visit
Admission: EM | Admit: 2022-10-26 | Discharge: 2022-10-26 | Disposition: A | Discharge status: Home / Self Care | Payer: Medicare HMO | Attending: Nurse Practitioner | Admitting: Nurse Practitioner

## 2022-10-26 DIAGNOSIS — H6121 Impacted cerumen, right ear: Secondary | ICD-10-CM

## 2022-10-26 NOTE — ED Provider Notes (Signed)
[facility]    CSN: [id number] Arrival date & time: 10/26/22  1302      History   Chief Complaint Chief Complaint  Patient presents with   Cerumen Impaction    HPI Willie Freeman is a 77 y.o. male presents for evaluation of ear wax removal.  Patient states he is being fitted for hearing aids tomorrow and was recommended that he have his ears cleaned prior to doing that.  Reports a history of cerumen impaction in the past.  He denies any drainage, pain, or hearing changes.  Denies Q-tips or earbuds.  No other concerns at this time.  HPI  Past Medical History:  Diagnosis Date   Back pain    Borderline diabetes mellitus 11/09/2015   BPH (benign prostatic hypertrophy)    Dysrhythmia    Elevated PSA    Heart murmur    History of elevated PSA 05/21/2015   Hydrocele    Hypertension    Myocardial infarction [facility])    Nocturia    Stroke (HCC)    ministroke   TIA (transient ischemic attack) 11/02/2013   Urinary frequency     Patient Active Problem List   Diagnosis Date Noted   H/O abdominal aortic aneurysm repair 07/30/2020   Essential hypertension 12/30/2017   Thrombocytopenia (HCC) 05/20/2017   Pure hypercholesterolemia 02/10/2017   History of normocytic normochromic anemia 11/09/2015   Borderline diabetes mellitus 11/09/2015   Vaccine counseling 11/08/2015   BPH with obstruction/lower urinary tract symptoms 05/21/2015   History of elevated PSA 05/21/2015   Primary insomnia 09/04/2014   S/P AVR 11/15/2013   TIA (transient ischemic attack) 11/02/2013    Past Surgical History:  Procedure Laterality Date   ABDOMINAL AORTIC ANEURYSM REPAIR  06/19/2020   Endovascular aneurysm repairGore Excluder [id number] Contralateral right iliac limb, 14x10    AORTIC VALVE REPLACEMENT N/A 11/15/2013   Procedure: AORTIC VALVE REPLACEMENT (AVR);  Surgeon: Alleen Borne, MD;  Location: [facility] OR;  Service: Open Heart Surgery;  Laterality: N/A;   COLONOSCOPY WITH PROPOFOL N/A 02/15/2016    Procedure: COLONOSCOPY WITH PROPOFOL;  Surgeon: Christena Deem, MD;  Location: [facility] ENDOSCOPY;  Service: Endoscopy;  Laterality: N/A;   HERNIA REPAIR  1954   bilateral - inguinal    HYDROCELE EXCISION Right 01/17/2019   Procedure: HYDROCELECTOMY ADULT;  Surgeon: Riki Altes, MD;  Location: [facility] ORS;  Service: Urology;  Laterality: Right;   INTRAOPERATIVE TRANSESOPHAGEAL ECHOCARDIOGRAM N/A 11/15/2013   Procedure: INTRAOPERATIVE TRANSESOPHAGEAL ECHOCARDIOGRAM;  Surgeon: Alleen Borne, MD;  Location: [facility] OR;  Service: Open Heart Surgery;  Laterality: N/A;   LEFT HEART CATHETERIZATION WITH CORONARY ANGIOGRAM N/A 11/02/2013   Procedure: LEFT HEART CATHETERIZATION WITH CORONARY ANGIOGRAM;  Surgeon: Pamella Pert, MD;  Location: [facility] CATH LAB;  Service: Cardiovascular;  Laterality: N/A;       Home Medications    Prior to Admission medications   Medication Sig Start Date End Date Taking? Authorizing Provider  amoxicillin (AMOXIL) 500 MG capsule Take 1,000 mg by mouth 2 (two) times daily. Patient not taking: Reported on 10/26/2022 03/25/22   [provider]  aspirin 81 MG chewable tablet Chew 81 mg by mouth 2 (two) times daily.    [provider]  atorvastatin (LIPITOR) 10 MG tablet TAKE 1 TABLET DAILY 08/12/22   Custovic, Rozell Searing, DO  augmented betamethasone dipropionate (DIPROLENE-AF) 0.05 % cream Apply topically 2 (two) times daily.    [provider]  ketoconazole (NIZORAL) 2 % shampoo Apply topically 3 (  three) times a week. 08/15/21   [provider]  olmesartan-hydrochlorothiazide (BENICAR HCT) 20-12.5 MG tablet Take 1 tablet by mouth daily. 09/15/22   Custovic, Rozell Searing, DO  traZODone (DESYREL) 50 MG tablet Take 150 mg by mouth at bedtime. 07/12/21   [provider]    Family History Family History  Problem Relation Age of Onset   Prostate cancer Father    Heart attack Mother    Hypertension Brother    Coronary artery disease Brother     Kidney disease Neg Hx    Kidney cancer Neg Hx    Bladder Cancer Neg Hx     Social History Social History   Tobacco Use   Smoking status: Former    Types: Cigars, Pipe   Smokeless tobacco: Never   Tobacco comments:    quit 60 years  Vaping Use   Vaping Use: Never used  Substance Use Topics   Alcohol use: Yes    Comment: Rarely- beer, glass of wine    Drug use: No     Allergies   Patient has no known allergies.   Review of Systems Review of Systems  HENT:         Bilateral ear irrigation for hearing aid fitting     Physical Exam Triage Vital Signs ED Triage Vitals  Enc Vitals Group     BP 10/26/22 1414 111/66     Pulse Rate 10/26/22 1414 63     Resp 10/26/22 1414 18     Temp 10/26/22 1414 98 F (36.7 C)     Temp src --      SpO2 10/26/22 1414 98 %     Weight --      Height --      Head Circumference --      Peak Flow --      Pain Score 10/26/22 1413 0     Pain Loc --      Pain Edu? --      Excl. in GC? --    No data found.  Updated Vital Signs BP 111/66   Pulse 63   Temp 98 F (36.7 C)   Resp 18   SpO2 98%   Visual Acuity Right Eye Distance:   Left Eye Distance:   Bilateral Distance:    Right Eye Near:   Left Eye Near:    Bilateral Near:     Physical Exam Vitals and nursing note reviewed.  Constitutional:      General: He is not in acute distress.    Appearance: Normal appearance. He is not ill-appearing.  HENT:     Head: Normocephalic and atraumatic.     Right Ear: There is impacted cerumen.     Ears:     Comments: Minimal cerumen in left ear.  70% of TM visible and is within normal limits Eyes:     Pupils: Pupils are equal, round, and reactive to light.  Cardiovascular:     Rate and Rhythm: Normal rate.  Pulmonary:     Effort: Pulmonary effort is normal.  Skin:    General: Skin is warm and dry.  Neurological:     General: No focal deficit present.     Mental Status: He is alert and oriented to person, place, and time.   Psychiatric:        Mood and Affect: Mood normal.        Behavior: Behavior normal.      UC Treatments / Results  Labs (all  labs ordered are listed, but only abnormal results are displayed) Labs Reviewed - No data to display  EKG   Radiology No results found.  Procedures Procedures (including critical care time)  Medications Ordered in UC Medications - No data to display  Initial Impression / Assessment and Plan / UC Course  I have reviewed the triage vital signs and the nursing notes.  Pertinent labs & imaging results that were available during my care of the patient were reviewed by me and considered in my medical decision making (see chart for details).     Debrox placed in right ear prior to irrigation.  Patient tolerated irrigation well. after irrigation bilateral TMs and canals within normal limits Patient to follow-up as needed Final Clinical Impressions(s) / UC Diagnoses   Final diagnoses:  Impacted cerumen of right ear     Discharge Instructions      Follow-up as needed     ED Prescriptions   None    PDMP not reviewed this encounter.   Radford Pax, NP 10/26/22 1447

## 2022-10-26 NOTE — Discharge Instructions (Addendum)
Follow up as needed

## 2022-10-26 NOTE — ED Triage Notes (Signed)
Patient to Urgent Care with complaints of bilateral ear fullness. Reports he has had the wax removed from his ears previously. States he is getting fitted for hearing aids and would like the wax cleaned prior to getting them.  No other complaints.

## 2022-12-15 ENCOUNTER — Other Ambulatory Visit: Payer: Self-pay

## 2022-12-15 ENCOUNTER — Emergency Department
Admission: EM | Admit: 2022-12-15 | Discharge: 2022-12-15 | Disposition: A | Discharge status: Home / Self Care | Payer: Medicare HMO | Attending: Emergency Medicine | Admitting: Emergency Medicine

## 2022-12-15 ENCOUNTER — Ambulatory Visit
Admission: EM | Admit: 2022-12-15 | Discharge: 2022-12-15 | Disposition: A | Discharge status: Home / Self Care | Payer: Medicare HMO

## 2022-12-15 ENCOUNTER — Encounter: Payer: Self-pay | Admitting: Emergency Medicine

## 2022-12-15 DIAGNOSIS — Z7982 Long term (current) use of aspirin: Secondary | ICD-10-CM | POA: Insufficient documentation

## 2022-12-15 DIAGNOSIS — R040 Epistaxis: Secondary | ICD-10-CM | POA: Diagnosis present

## 2022-12-15 NOTE — Discharge Instructions (Addendum)
Use nasal saline as directed.  See the attached information on nose bleeds.  Follow up with your primary care provider.

## 2022-12-15 NOTE — ED Provider Notes (Signed)
Willie Freeman    CSN: [id number] Arrival date & time: 12/15/22  0801      History   Chief Complaint Chief Complaint  Patient presents with   Epistaxis    HPI Willie Freeman is a 77 y.o. male.  Patient presents with an episode of nose bleed last night.  The episode lasted about 1 minute and resolved with a tissue inserted into nostril.  When he got up this morning, he blew his nose and had a blood clot come out but no active bleeding.  He denies dizziness, weakness, headache, chest pain, shortness of breath, or other symptoms.  He takes low-dose aspirin twice a day.  His medical history includes thrombocytopenia, hypertension, TIA, MI, diabetes.  The history is provided by the patient and medical records.    Past Medical History:  Diagnosis Date   Back pain    Borderline diabetes mellitus 11/09/2015   BPH (benign prostatic hypertrophy)    Dysrhythmia    Elevated PSA    Heart murmur    History of elevated PSA 05/21/2015   Hydrocele    Hypertension    Myocardial infarction [facility])    Nocturia    Stroke (HCC)    ministroke   TIA (transient ischemic attack) 11/02/2013   Urinary frequency     Patient Active Problem List   Diagnosis Date Noted   H/O abdominal aortic aneurysm repair 07/30/2020   Essential hypertension 12/30/2017   Thrombocytopenia (HCC) 05/20/2017   Pure hypercholesterolemia 02/10/2017   History of normocytic normochromic anemia 11/09/2015   Borderline diabetes mellitus 11/09/2015   Vaccine counseling 11/08/2015   BPH with obstruction/lower urinary tract symptoms 05/21/2015   History of elevated PSA 05/21/2015   Primary insomnia 09/04/2014   S/P AVR 11/15/2013   TIA (transient ischemic attack) 11/02/2013    Past Surgical History:  Procedure Laterality Date   ABDOMINAL AORTIC ANEURYSM REPAIR  06/19/2020   Endovascular aneurysm repairGore Excluder [id number] Contralateral right iliac limb, 14x10    AORTIC VALVE REPLACEMENT N/A 11/15/2013    Procedure: AORTIC VALVE REPLACEMENT (AVR);  Surgeon: Alleen Borne, MD;  Location: [facility] OR;  Service: Open Heart Surgery;  Laterality: N/A;   COLONOSCOPY WITH PROPOFOL N/A 02/15/2016   Procedure: COLONOSCOPY WITH PROPOFOL;  Surgeon: Christena Deem, MD;  Location: [facility] ENDOSCOPY;  Service: Endoscopy;  Laterality: N/A;   HERNIA REPAIR  1954   bilateral - inguinal    HYDROCELE EXCISION Right 01/17/2019   Procedure: HYDROCELECTOMY ADULT;  Surgeon: Riki Altes, MD;  Location: [facility] ORS;  Service: Urology;  Laterality: Right;   INTRAOPERATIVE TRANSESOPHAGEAL ECHOCARDIOGRAM N/A 11/15/2013   Procedure: INTRAOPERATIVE TRANSESOPHAGEAL ECHOCARDIOGRAM;  Surgeon: Alleen Borne, MD;  Location: [facility] OR;  Service: Open Heart Surgery;  Laterality: N/A;   LEFT HEART CATHETERIZATION WITH CORONARY ANGIOGRAM N/A 11/02/2013   Procedure: LEFT HEART CATHETERIZATION WITH CORONARY ANGIOGRAM;  Surgeon: Pamella Pert, MD;  Location[facility] CATH LAB;  Service: Cardiovascular;  Laterality: N/A;       Home Medications    Prior to Admission medications   Medication Sig Start Date End Date Taking? Authorizing Provider  amoxicillin (AMOXIL) 500 MG capsule Take 1,000 mg by mouth 2 (two) times daily. Patient not taking: Reported on 10/26/2022 03/25/22   [provider]  aspirin 81 MG chewable tablet Chew 81 mg by mouth 2 (two) times daily.    [provider]  atorvastatin (LIPITOR) 10 MG tablet TAKE 1 TABLET DAILY 08/12/22   Custovic, Rozell Searing, DO  augmented betamethasone dipropionate (DIPROLENE-AF) 0.05 % cream Apply topically 2 (two) times daily.    [provider]  ketoconazole (NIZORAL) 2 % shampoo Apply topically 3 (three) times a week. 08/15/21   [provider]  olmesartan-hydrochlorothiazide (BENICAR HCT) 20-12.5 MG tablet Take 1 tablet by mouth daily. 09/15/22   Custovic, Rozell Searing, DO  traZODone (DESYREL) 50 MG tablet Take 150 mg by mouth at bedtime. 07/12/21   [provider]     Family History Family History  Problem Relation Age of Onset   Prostate cancer Father    Heart attack Mother    Hypertension Brother    Coronary artery disease Brother    Kidney disease Neg Hx    Kidney cancer Neg Hx    Bladder Cancer Neg Hx     Social History Social History   Tobacco Use   Smoking status: Former    Types: Cigars, Pipe   Smokeless tobacco: Never   Tobacco comments:    quit 60 years  Vaping Use   Vaping Use: Never used  Substance Use Topics   Alcohol use: Yes    Comment: Rarely- beer, glass of wine    Drug use: No     Allergies   Patient has no known allergies.   Review of Systems Review of Systems  Constitutional:  Negative for chills and fever.  HENT:  Positive for nosebleeds. Negative for ear pain and sore throat.   Respiratory:  Negative for cough and shortness of breath.   Cardiovascular:  Negative for chest pain and palpitations.  Neurological:  Negative for dizziness, syncope, weakness and numbness.  All other systems reviewed and are negative.    Physical Exam Triage Vital Signs ED Triage Vitals  Enc Vitals Group     BP      Pulse      Resp      Temp      Temp src      SpO2      Weight      Height      Head Circumference      Peak Flow      Pain Score      Pain Loc      Pain Edu?      Excl. in GC?    No data found.  Updated Vital Signs BP 136/79   Pulse (!) 57   Temp 98.3 F (36.8 C)   Resp 18   SpO2 95%   Visual Acuity Right Eye Distance:   Left Eye Distance:   Bilateral Distance:    Right Eye Near:   Left Eye Near:    Bilateral Near:     Physical Exam Vitals and nursing note reviewed.  Constitutional:      General: He is not in acute distress.    Appearance: Normal appearance. He is well-developed. He is not ill-appearing.  HENT:     Right Ear: Tympanic membrane normal.     Left Ear: Tympanic membrane normal.     Nose:     Comments: Scant dried blood in left nostril.    Mouth/Throat:      Mouth: Mucous membranes are moist.     Pharynx: Oropharynx is clear.  Cardiovascular:     Rate and Rhythm: Normal rate and regular rhythm.     Heart sounds: Normal heart sounds.  Pulmonary:     Effort: Pulmonary effort is normal. No respiratory distress.     Breath sounds: Normal breath sounds.  Musculoskeletal:  Cervical back: Neck supple.  Skin:    General: Skin is warm and dry.  Neurological:     Mental Status: He is alert.  Psychiatric:        Mood and Affect: Mood normal.        Behavior: Behavior normal.      UC Treatments / Results  Labs (all labs ordered are listed, but only abnormal results are displayed) Labs Reviewed - No data to display  EKG   Radiology No results found.  Procedures Procedures (including critical care time)  Medications Ordered in UC Medications - No data to display  Initial Impression / Assessment and Plan / UC Course  I have reviewed the triage vital signs and the nursing notes.  Pertinent labs & imaging results that were available during my care of the patient were reviewed by me and considered in my medical decision making (see chart for details).    Epistaxis.  Patient had 1 brief episode of a nosebleed last night.  When he blew his nose this morning, he had a blood clot from the left nostril.  He has a scant amount of dried blood in the left nostril.  Vital signs are stable.  He takes baby aspirin twice a day and has history of thrombocytopenia.  Last platelet count was 145 in February.  Discussed management of nosebleeds.  ED precautions discussed.  Instructed him to follow-up with his PCP.  Education provided on nosebleed.  He agrees to plan of care.  Final Clinical Impressions(s) / UC Diagnoses   Final diagnoses:  Epistaxis     Discharge Instructions      Use nasal saline as directed.  See the attached information on nose bleeds.  Follow up with your primary care provider.        ED Prescriptions   None    PDMP  not reviewed this encounter.   Mickie Bail, NP 12/15/22 [phone]

## 2022-12-15 NOTE — ED Triage Notes (Signed)
Patient to Urgent Care with complaints of a left sided nose bleed. Reports intermittent bleeding that started last night. Reports he used a kleenex over night inside of his nose and woke up with blood still on the tissue.  Denies any hx of nose bleeds. Takes daily BID aspirin. No active bleeding at this time.

## 2022-12-15 NOTE — ED Triage Notes (Signed)
Pt comes with c/o nose bleed that started last night. Pt woke up this am and had some blood on tissue from last night. Pt went to UC this am and they evaluated them. Pt was advised if it started again to come to ED. Pt states as soon as he left it happened again so he came here.  Pt is not bleeding at this time. Pt does take daily aspirin.

## 2022-12-15 NOTE — ED Provider Notes (Signed)
   [facility] Provider Note    Event Date/Time   First MD Initiated Contact with Patient 12/15/22 1052     (approximate)   History   Epistaxis   HPI  Willie Freeman is a 77 y.o. male on baby aspirin who presents with complaints of left-sided nosebleed.  Patient reports notices blood twice today, he was able to stop at both times.  Went to urgent care 1 time.  Was lifting a motor later on and nose started bleeding again.     Physical Exam   Triage Vital Signs: ED Triage Vitals  Enc Vitals Group     BP 12/15/22 1034 (!) 156/81     Pulse Rate 12/15/22 1034 (!) 58     Resp 12/15/22 1034 18     Temp 12/15/22 1034 98 F (36.7 C)     Temp src --      SpO2 12/15/22 1034 96 %     Weight 12/15/22 1054 71.8 kg (158 lb 4.6 oz)     Height 12/15/22 1054 1.803 m (5\' 11" )     Head Circumference --      Peak Flow --      Pain Score 12/15/22 1034 0     Pain Loc --      Pain Edu? --      Excl. in GC? --     Most recent vital signs: Vitals:   12/15/22 1034  BP: (!) 156/81  Pulse: (!) 58  Resp: 18  Temp: 98 F (36.7 C)  SpO2: 96%     General: Awake, no distress.  CV:  Good peripheral perfusion.  Resp:  Normal effort.  Abd:  No distention.  Other:  Left naris: No obvious area of bleeding   ED Results / Procedures / Treatments   Labs (all labs ordered are listed, but only abnormal results are displayed) Labs Reviewed - No data to display   EKG     RADIOLOGY     PROCEDURES:  Critical Care performed:   Procedures   MEDICATIONS ORDERED IN ED: Medications - No data to display   IMPRESSION / MDM / ASSESSMENT AND PLAN / ED COURSE  I reviewed the triage vital signs and the nursing notes. Patient's presentation is most consistent with acute, uncomplicated illness.  Patient with epistaxis, not currently bleeding, no area to cauterize noted, recommend supportive measures        FINAL CLINICAL IMPRESSION(S) / ED DIAGNOSES    Final diagnoses:  Left-sided epistaxis     Rx / DC Orders   ED Discharge Orders     None        Note:  This document was prepared using Dragon voice recognition software and may include unintentional dictation errors.   Jene Every, MD 12/15/22 [phone]

## 2023-02-19 ENCOUNTER — Inpatient Hospital Stay: Payer: Medicare HMO

## 2023-02-19 ENCOUNTER — Inpatient Hospital Stay: Payer: Medicare HMO | Attending: Oncology | Admitting: Oncology

## 2023-02-19 ENCOUNTER — Encounter: Payer: Self-pay | Admitting: Oncology

## 2023-02-19 VITALS — BP 130/76 | HR 54 | Temp 97.9°F | Resp 19 | Wt 154.8 lb

## 2023-02-19 DIAGNOSIS — D61818 Other pancytopenia: Secondary | ICD-10-CM | POA: Insufficient documentation

## 2023-02-19 DIAGNOSIS — Z6821 Body mass index (BMI) 21.0-21.9, adult: Secondary | ICD-10-CM | POA: Insufficient documentation

## 2023-02-19 DIAGNOSIS — D649 Anemia, unspecified: Secondary | ICD-10-CM | POA: Insufficient documentation

## 2023-02-19 DIAGNOSIS — Z8042 Family history of malignant neoplasm of prostate: Secondary | ICD-10-CM | POA: Diagnosis not present

## 2023-02-19 DIAGNOSIS — Z87891 Personal history of nicotine dependence: Secondary | ICD-10-CM | POA: Diagnosis not present

## 2023-02-19 LAB — IRON AND TIBC
Iron: 69 ug/dL (ref 45–182)
Saturation Ratios: 20 % (ref 17.9–39.5)
TIBC: 344 ug/dL (ref 250–450)
UIBC: 275 ug/dL

## 2023-02-19 LAB — FOLATE: Folate: 8.6 ng/mL (ref >5.9)

## 2023-02-19 LAB — CBC (CANCER CENTER ONLY)
HCT: 35.7 % — ABNORMAL LOW (ref 39.0–52.0)
Hemoglobin: 11.3 g/dL — ABNORMAL LOW (ref 13.0–17.0)
MCH: 28.7 pg (ref 26.0–34.0)
MCHC: 31.7 g/dL (ref 30.0–36.0)
MCV: 90.6 fL (ref 80.0–100.0)
Platelet Count: 145 10*3/uL — ABNORMAL LOW (ref 150–400)
RBC: 3.94 MIL/uL — ABNORMAL LOW (ref 4.22–5.81)
RDW: 14.3 % (ref 11.5–15.5)
WBC Count: 4.3 10*3/uL (ref 4.0–10.5)
nRBC: 0 % (ref 0.0–0.2)

## 2023-02-19 LAB — FERRITIN: Ferritin: 10 ng/mL — ABNORMAL LOW (ref 24–336)

## 2023-02-19 LAB — VITAMIN B12: Vitamin B-12: 171 pg/mL — ABNORMAL LOW (ref 180–914)

## 2023-02-19 LAB — LACTATE DEHYDROGENASE: LDH: 165 U/L (ref 98–192)

## 2023-02-19 NOTE — Progress Notes (Signed)
[facility]  Telephone:([phone] [phone]  ID: Willie Freeman OB: 05/30/1946  MR#: [id number]  GEX#:[id number]  Patient Care Team: Marisue Ivan, MD as PCP - General (Family Medicine) Pcp, No  CHIEF COMPLAINT: Pancytopenia.  INTERVAL HISTORY: Patient is a 77 year old male who was noted to have mild pancytopenia on routine blood work.  He currently feels well and is asymptomatic.  He continues to remain active and work full-time.  He has no new medications.  He denies any recent fevers or illnesses.  He has a good appetite and denies weight loss.  He has no chest pain, shortness of breath, cough, or hemoptysis.  He denies any nausea, vomiting, constipation, or diarrhea.  He has no urinary complaints.  Patient feels at his baseline and offers no specific complaints today.  REVIEW OF SYSTEMS:   Review of Systems  Constitutional: Negative.  Negative for fever, malaise/fatigue and weight loss.  Respiratory: Negative.  Negative for cough, hemoptysis and shortness of breath.   Cardiovascular: Negative.  Negative for chest pain and leg swelling.  Gastrointestinal: Negative.  Negative for abdominal pain.  Genitourinary: Negative.  Negative for dysuria.  Musculoskeletal: Negative.  Negative for back pain.  Skin: Negative.  Negative for rash.  Neurological: Negative.  Negative for dizziness, focal weakness, weakness and headaches.  Psychiatric/Behavioral: Negative.  The patient is not nervous/anxious.     As per HPI. Otherwise, a complete review of systems is negative.  PAST MEDICAL HISTORY: Past Medical History:  Diagnosis Date   Back pain    Borderline diabetes mellitus 11/09/2015   BPH (benign prostatic hypertrophy)    Dysrhythmia    Elevated PSA    Heart murmur    History of elevated PSA 05/21/2015   Hydrocele    Hypertension    Myocardial infarction [facility])    Nocturia    Stroke (HCC)    ministroke   TIA (transient ischemic attack) 11/02/2013    Urinary frequency     PAST SURGICAL HISTORY: Past Surgical History:  Procedure Laterality Date   ABDOMINAL AORTIC ANEURYSM REPAIR  06/19/2020   Endovascular aneurysm repairGore Excluder 23x14x14 Contralateral right iliac limb, 14x10    AORTIC VALVE REPLACEMENT N/A 11/15/2013   Procedure: AORTIC VALVE REPLACEMENT (AVR);  Surgeon: Alleen Borne, MD;  Location: [facility] OR;  Service: Open Heart Surgery;  Laterality: N/A;   COLONOSCOPY WITH PROPOFOL N/A 02/15/2016   Procedure: COLONOSCOPY WITH PROPOFOL;  Surgeon: Christena Deem, MD;  Location: [facility] ENDOSCOPY;  Service: Endoscopy;  Laterality: N/A;   HERNIA REPAIR  1954   bilateral - inguinal    HYDROCELE EXCISION Right 01/17/2019   Procedure: HYDROCELECTOMY ADULT;  Surgeon: Riki Altes, MD;  Location: [facility] ORS;  Service: Urology;  Laterality: Right;   INTRAOPERATIVE TRANSESOPHAGEAL ECHOCARDIOGRAM N/A 11/15/2013   Procedure: INTRAOPERATIVE TRANSESOPHAGEAL ECHOCARDIOGRAM;  Surgeon: Alleen Borne, MD;  Location: [facility] OR;  Service: Open Heart Surgery;  Laterality: N/A;   LEFT HEART CATHETERIZATION WITH CORONARY ANGIOGRAM N/A 11/02/2013   Procedure: LEFT HEART CATHETERIZATION WITH CORONARY ANGIOGRAM;  Surgeon: Pamella Pert, MD;  Location: [facility] CATH LAB;  Service: Cardiovascular;  Laterality: N/A;    FAMILY HISTORY: Family History  Problem Relation Age of Onset   Prostate cancer Father    Heart attack Mother    Hypertension Brother    Coronary artery disease Brother    Kidney disease Neg Hx    Kidney cancer Neg Hx    Bladder Cancer Neg Hx     ADVANCED  DIRECTIVES (Y/N):  N  HEALTH MAINTENANCE: Social History   Tobacco Use   Smoking status: Former    Types: Cigars, Pipe   Smokeless tobacco: Never   Tobacco comments:    quit 60 years  Vaping Use   Vaping status: Never Used  Substance Use Topics   Alcohol use: Yes    Comment: Rarely- beer, glass of wine    Drug use: No     Colonoscopy:  PAP:  Bone density:  Lipid  panel:  No Known Allergies  Current Outpatient Medications  Medication Sig Dispense Refill   aspirin 81 MG chewable tablet Chew 81 mg by mouth 2 (two) times daily.     atorvastatin (LIPITOR) 10 MG tablet TAKE 1 TABLET DAILY 90 tablet 3   augmented betamethasone dipropionate (DIPROLENE-AF) 0.05 % cream Apply topically 2 (two) times daily.     ketoconazole (NIZORAL) 2 % shampoo Apply topically 3 (three) times a week.     olmesartan-hydrochlorothiazide (BENICAR HCT) 20-12.5 MG tablet Take 1 tablet by mouth daily. 90 tablet 3   traZODone (DESYREL) 50 MG tablet Take 150 mg by mouth at bedtime.     amoxicillin (AMOXIL) 500 MG capsule Take 1,000 mg by mouth 2 (two) times daily. (Patient not taking: Reported on 10/26/2022)     No current facility-administered medications for this visit.    OBJECTIVE: Vitals:   02/19/23 1507  BP: 130/76  Pulse: (!) 54  Resp: 19  Temp: 97.9 F (36.6 C)  SpO2: 99%     Body mass index is 21.59 kg/m.    ECOG FS:0 - Asymptomatic  General: Well-developed, well-nourished, no acute distress. Eyes: Pink conjunctiva, anicteric sclera. HEENT: Normocephalic, moist mucous membranes. Lungs: No audible wheezing or coughing. Heart: Regular rate and rhythm. Abdomen: Soft, nontender, no obvious distention. Musculoskeletal: No edema, cyanosis, or clubbing. Neuro: Alert, answering all questions appropriately. Cranial nerves grossly intact. Skin: No rashes or petechiae noted. Psych: Normal affect. Lymphatics: No cervical, calvicular, axillary or inguinal LAD.   LAB RESULTS:  Lab Results  Component Value Date   NA 138 05/23/2020   K 3.7 05/23/2020   CL 101 05/23/2020   CO2 28 05/23/2020   GLUCOSE 120 (H) 05/23/2020   BUN 21 05/23/2020   CREATININE 0.89 05/23/2020   CALCIUM 8.9 05/23/2020   PROT 6.8 05/23/2020   ALBUMIN 3.7 05/23/2020   AST 18 05/23/2020   ALT 17 05/23/2020   ALKPHOS 72 05/23/2020   BILITOT 1.2 05/23/2020   GFRNONAA >60 05/23/2020   GFRAA  >60 10/29/2014    Lab Results  Component Value Date   WBC 4.3 02/19/2023   NEUTROABS 4.4 11/02/2013   HGB 11.3 (L) 02/19/2023   HCT 35.7 (L) 02/19/2023   MCV 90.6 02/19/2023   PLT 145 (L) 02/19/2023     STUDIES: No results found.  ASSESSMENT: Pancytopenia.  PLAN:    Pancytopenia: Patient's white blood cell count is within normal limits at 4.3.  He continues to have a persistently decreased hemoglobin as well as platelet count.  The remainder of his laboratory work from today is pending at time of dictation.  No intervention is needed at this time.  Patient does not require bone marrow biopsy.  Return to clinic in 3 weeks for further evaluation and discussion of his results.  I spent a total of 45 minutes reviewing chart data, face-to-face evaluation with the patient, counseling and coordination of care as detailed above.   Patient expressed understanding and was in agreement with this  plan. He also understands that He can call clinic at any time with any questions, concerns, or complaints.     Jeralyn Ruths, MD   02/19/2023 4:04 PM

## 2023-02-21 LAB — PLATELET ANTIBODY PROFILE
Glycoprotein IV Antibody: NEGATIVE
HLA Ab Ser Ql EIA: NEGATIVE
IA/IIA Antibody: NEGATIVE
IB/IX Antibody: NEGATIVE
IIB/IIIA Antibody: NEGATIVE

## 2023-02-21 LAB — HAPTOGLOBIN: Haptoglobin: 81 mg/dL (ref 34–355)

## 2023-02-22 LAB — PROTEIN ELECTROPHORESIS, SERUM
A/G Ratio: 1.3 (ref 0.7–1.7)
Albumin ELP: 3.4 g/dL (ref 2.9–4.4)
Alpha-1-Globulin: 0.2 g/dL (ref 0.0–0.4)
Alpha-2-Globulin: 0.6 g/dL (ref 0.4–1.0)
Beta Globulin: 1 g/dL (ref 0.7–1.3)
Gamma Globulin: 0.8 g/dL (ref 0.4–1.8)
Globulin, Total: 2.7 g/dL (ref 2.2–3.9)
Total Protein ELP: 6.1 g/dL (ref 6.0–8.5)

## 2023-02-24 LAB — COMP PANEL: LEUKEMIA/LYMPHOMA

## 2023-03-03 LAB — INTELLIGEN MYELOID

## 2023-03-23 ENCOUNTER — Inpatient Hospital Stay: Payer: Medicare HMO | Admitting: Oncology

## 2023-04-13 ENCOUNTER — Inpatient Hospital Stay: Payer: Medicare HMO | Attending: Oncology | Admitting: Oncology

## 2023-04-13 ENCOUNTER — Encounter: Payer: Self-pay | Admitting: Oncology

## 2023-04-13 VITALS — BP 128/75 | HR 62 | Temp 97.1°F | Resp 16 | Ht 71.0 in | Wt 156.7 lb

## 2023-04-13 DIAGNOSIS — D61818 Other pancytopenia: Secondary | ICD-10-CM | POA: Insufficient documentation

## 2023-04-13 DIAGNOSIS — Z8042 Family history of malignant neoplasm of prostate: Secondary | ICD-10-CM | POA: Diagnosis not present

## 2023-04-13 DIAGNOSIS — Z87891 Personal history of nicotine dependence: Secondary | ICD-10-CM | POA: Insufficient documentation

## 2023-04-13 NOTE — Progress Notes (Unsigned)
[facility]  Telephone:(336) [phone] Fax:(336) [phone]  ID: [name] OB: Nov 03, 1945  MR#: [id number]  XHB#:[id number]  Patient Care Team: Marisue Ivan, MD as PCP - General (Family Medicine) Pcp, No  CHIEF COMPLAINT: Pancytopenia.  INTERVAL HISTORY: Patient returns to clinic today for repeat laboratory work and further evaluation.  He continues to feel well and remains asymptomatic.  He continues to remain active and work full-time. He denies any recent fevers or illnesses.  He has a good appetite and denies weight loss.  He has no chest pain, shortness of breath, cough, or hemoptysis.  He denies any nausea, vomiting, constipation, or diarrhea.  He has no urinary complaints.  Patient offers no specific complaints today.  REVIEW OF SYSTEMS:   Review of Systems  Constitutional: Negative.  Negative for fever, malaise/fatigue and weight loss.  Respiratory: Negative.  Negative for cough, hemoptysis and shortness of breath.   Cardiovascular: Negative.  Negative for chest pain and leg swelling.  Gastrointestinal: Negative.  Negative for abdominal pain.  Genitourinary: Negative.  Negative for dysuria.  Musculoskeletal: Negative.  Negative for back pain.  Skin: Negative.  Negative for rash.  Neurological: Negative.  Negative for dizziness, focal weakness, weakness and headaches.  Psychiatric/Behavioral: Negative.  The patient is not nervous/anxious.     As per HPI. Otherwise, a complete review of systems is negative.  PAST MEDICAL HISTORY: Past Medical History:  Diagnosis Date   Back pain    Borderline diabetes mellitus 11/09/2015   BPH (benign prostatic hypertrophy)    Dysrhythmia    Elevated PSA    Heart murmur    History of elevated PSA 05/21/2015   Hydrocele    Hypertension    Myocardial infarction [facility])    Nocturia    Stroke (HCC)    ministroke   TIA (transient ischemic attack) 11/02/2013   Urinary frequency     PAST SURGICAL HISTORY: Past  Surgical History:  Procedure Laterality Date   ABDOMINAL AORTIC ANEURYSM REPAIR  06/19/2020   Endovascular aneurysm repairGore Excluder 23x14x14 Contralateral right iliac limb, 14x10    AORTIC VALVE REPLACEMENT N/A 11/15/2013   Procedure: AORTIC VALVE REPLACEMENT (AVR);  Surgeon: Alleen Borne, MD;  Location: [facility] OR;  Service: Open Heart Surgery;  Laterality: N/A;   COLONOSCOPY WITH PROPOFOL N/A 02/15/2016   Procedure: COLONOSCOPY WITH PROPOFOL;  Surgeon: Christena Deem, MD;  Location: [facility] ENDOSCOPY;  Service: Endoscopy;  Laterality: N/A;   HERNIA REPAIR  1954   bilateral - inguinal    HYDROCELE EXCISION Right 01/17/2019   Procedure: HYDROCELECTOMY ADULT;  Surgeon: Riki Altes, MD;  Location: [facility] ORS;  Service: Urology;  Laterality: Right;   INTRAOPERATIVE TRANSESOPHAGEAL ECHOCARDIOGRAM N/A 11/15/2013   Procedure: INTRAOPERATIVE TRANSESOPHAGEAL ECHOCARDIOGRAM;  Surgeon: Alleen Borne, MD;  Location: [facility] OR;  Service: Open Heart Surgery;  Laterality: N/A;   LEFT HEART CATHETERIZATION WITH CORONARY ANGIOGRAM N/A 11/02/2013   Procedure: LEFT HEART CATHETERIZATION WITH CORONARY ANGIOGRAM;  Surgeon: Pamella Pert, MD;  Location: [facility] CATH LAB;  Service: Cardiovascular;  Laterality: N/A;    FAMILY HISTORY: Family History  Problem Relation Age of Onset   Prostate cancer Father    Heart attack Mother    Hypertension Brother    Coronary artery disease Brother    Kidney disease Neg Hx    Kidney cancer Neg Hx    Bladder Cancer Neg Hx     ADVANCED DIRECTIVES (Y/N):  N  HEALTH MAINTENANCE: Social History   Tobacco Use  Smoking status: Former    Types: Cigars, Pipe   Smokeless tobacco: Never   Tobacco comments:    quit 60 years  Vaping Use   Vaping status: Never Used  Substance Use Topics   Alcohol use: Yes    Comment: Rarely- beer, glass of wine    Drug use: No     Colonoscopy:  PAP:  Bone density:  Lipid panel:  No Known Allergies  Current Outpatient Medications   Medication Sig Dispense Refill   aspirin 81 MG chewable tablet Chew 81 mg by mouth 2 (two) times daily.     atorvastatin (LIPITOR) 10 MG tablet TAKE 1 TABLET DAILY 90 tablet 3   augmented betamethasone dipropionate (DIPROLENE-AF) 0.05 % cream Apply topically 2 (two) times daily.     ketoconazole (NIZORAL) 2 % shampoo Apply topically 3 (three) times a week.     olmesartan-hydrochlorothiazide (BENICAR HCT) 20-12.5 MG tablet Take 1 tablet by mouth daily. 90 tablet 3   traZODone (DESYREL) 50 MG tablet Take 150 mg by mouth at bedtime.     amoxicillin (AMOXIL) 500 MG capsule Take 1,000 mg by mouth 2 (two) times daily. (Patient not taking: Reported on 10/26/2022)     No current facility-administered medications for this visit.    OBJECTIVE: Vitals:   04/13/23 1434  BP: 128/75  Pulse: 62  Resp: 16  Temp: (!) 97.1 F (36.2 C)  SpO2: 99%     Body mass index is 21.86 kg/m.    ECOG FS:0 - Asymptomatic  General: Well-developed, well-nourished, no acute distress. Eyes: Pink conjunctiva, anicteric sclera. HEENT: Normocephalic, moist mucous membranes. Lungs: No audible wheezing or coughing. Heart: Regular rate and rhythm. Abdomen: Soft, nontender, no obvious distention. Musculoskeletal: No edema, cyanosis, or clubbing. Neuro: Alert, answering all questions appropriately. Cranial nerves grossly intact. Skin: No rashes or petechiae noted. Psych: Normal affect.  LAB RESULTS:  Lab Results  Component Value Date   NA 138 05/23/2020   K 3.7 05/23/2020   CL 101 05/23/2020   CO2 28 05/23/2020   GLUCOSE 120 (H) 05/23/2020   BUN 21 05/23/2020   CREATININE 0.89 05/23/2020   CALCIUM 8.9 05/23/2020   PROT 6.8 05/23/2020   ALBUMIN 3.7 05/23/2020   AST 18 05/23/2020   ALT 17 05/23/2020   ALKPHOS 72 05/23/2020   BILITOT 1.2 05/23/2020   GFRNONAA >60 05/23/2020   GFRAA >60 10/29/2014    Lab Results  Component Value Date   WBC 4.3 02/19/2023   NEUTROABS 4.4 11/02/2013   HGB 11.3 (L)  02/19/2023   HCT 35.7 (L) 02/19/2023   MCV 90.6 02/19/2023   PLT 145 (L) 02/19/2023   Lab Results  Component Value Date   IRON 69 02/19/2023   TIBC 344 02/19/2023   IRONPCTSAT 20 02/19/2023   Lab Results  Component Value Date   FERRITIN 10 (L) 02/19/2023     STUDIES: No results found.  ASSESSMENT: Pancytopenia.  PLAN:    Pancytopenia: Patient's white blood cell count is within normal limits at 4.3.  He continues to have a persistently decreased hemoglobin as well as platelet count.  He has a mildly decreased ferritin level and B12 level.  Have recommended patient take both iron and B12 oral supplementation.  All of his other laboratory work is either negative or within normal limits.  No further intervention is needed at this time.  Return to clinic in 3 months with repeat laboratory work and further evaluation.    I spent a total of 20  minutes reviewing chart data, face-to-face evaluation with the patient, counseling and coordination of care as detailed above.    Patient expressed understanding and was in agreement with this plan. He also understands that He can call clinic at any time with any questions, concerns, or complaints.     Jeralyn Ruths, MD   04/14/2023 3:56 PM

## 2023-06-30 ENCOUNTER — Other Ambulatory Visit: Payer: Self-pay | Admitting: Internal Medicine

## 2023-07-13 ENCOUNTER — Inpatient Hospital Stay: Payer: Medicare HMO | Attending: Oncology

## 2023-07-13 DIAGNOSIS — D61818 Other pancytopenia: Secondary | ICD-10-CM | POA: Diagnosis present

## 2023-07-13 DIAGNOSIS — D696 Thrombocytopenia, unspecified: Secondary | ICD-10-CM | POA: Diagnosis not present

## 2023-07-13 DIAGNOSIS — Z87891 Personal history of nicotine dependence: Secondary | ICD-10-CM | POA: Diagnosis not present

## 2023-07-13 DIAGNOSIS — Z8042 Family history of malignant neoplasm of prostate: Secondary | ICD-10-CM | POA: Insufficient documentation

## 2023-07-13 LAB — IRON AND TIBC
Iron: 73 ug/dL (ref 45–182)
Saturation Ratios: 22 % (ref 17.9–39.5)
TIBC: 336 ug/dL (ref 250–450)
UIBC: 263 ug/dL

## 2023-07-13 LAB — CBC WITH DIFFERENTIAL/PLATELET
Abs Immature Granulocytes: 0.01 10*3/uL (ref 0.00–0.07)
Basophils Absolute: 0 10*3/uL (ref 0.0–0.1)
Basophils Relative: 1 %
Eosinophils Absolute: 0 10*3/uL (ref 0.0–0.5)
Eosinophils Relative: 0 %
HCT: 37.4 % — ABNORMAL LOW (ref 39.0–52.0)
Hemoglobin: 12.2 g/dL — ABNORMAL LOW (ref 13.0–17.0)
Immature Granulocytes: 0 %
Lymphocytes Relative: 21 %
Lymphs Abs: 1 10*3/uL (ref 0.7–4.0)
MCH: 29.6 pg (ref 26.0–34.0)
MCHC: 32.6 g/dL (ref 30.0–36.0)
MCV: 90.8 fL (ref 80.0–100.0)
Monocytes Absolute: 0.3 10*3/uL (ref 0.1–1.0)
Monocytes Relative: 7 %
Neutro Abs: 3.3 10*3/uL (ref 1.7–7.7)
Neutrophils Relative %: 71 %
Platelets: 129 10*3/uL — ABNORMAL LOW (ref 150–400)
RBC: 4.12 MIL/uL — ABNORMAL LOW (ref 4.22–5.81)
RDW: 14.4 % (ref 11.5–15.5)
WBC: 4.6 10*3/uL (ref 4.0–10.5)
nRBC: 0 % (ref 0.0–0.2)

## 2023-07-13 LAB — FERRITIN: Ferritin: 16 ng/mL — ABNORMAL LOW (ref 24–336)

## 2023-07-13 LAB — VITAMIN B12: Vitamin B-12: 626 pg/mL (ref 180–914)

## 2023-07-14 ENCOUNTER — Inpatient Hospital Stay: Payer: Medicare HMO | Admitting: Oncology

## 2023-07-14 ENCOUNTER — Encounter: Payer: Self-pay | Admitting: Oncology

## 2023-07-14 VITALS — BP 133/80 | HR 60 | Temp 96.7°F | Resp 16 | Ht 71.0 in | Wt 157.5 lb

## 2023-07-14 DIAGNOSIS — D61818 Other pancytopenia: Secondary | ICD-10-CM

## 2023-07-14 NOTE — Progress Notes (Signed)
[facility]  Telephone:(336) [phone] Fax:(336) [phone]  ID: [name] OB: 06-22-1946  MR#: [id number]  FGH#:[id number]  Patient Care Team: Marisue Ivan, MD as PCP - General (Family Medicine) Pcp, No  CHIEF COMPLAINT: Pancytopenia.  INTERVAL HISTORY: Patient returns to clinic today for repeat laboratory can further evaluation.  He continues to take oral iron and B12 supplementation and is tolerating them well.  He continues to feel well and remains asymptomatic.  He continues to remain active and work full-time. He denies any recent fevers or illnesses.  He has a good appetite and denies weight loss.  He has no chest pain, shortness of breath, cough, or hemoptysis.  He denies any nausea, vomiting, constipation, or diarrhea.  He has no urinary complaints.  Patient offers no specific complaints today.  REVIEW OF SYSTEMS:   Review of Systems  Constitutional: Negative.  Negative for fever, malaise/fatigue and weight loss.  Respiratory: Negative.  Negative for cough, hemoptysis and shortness of breath.   Cardiovascular: Negative.  Negative for chest pain and leg swelling.  Gastrointestinal: Negative.  Negative for abdominal pain.  Genitourinary: Negative.  Negative for dysuria.  Musculoskeletal: Negative.  Negative for back pain.  Skin: Negative.  Negative for rash.  Neurological: Negative.  Negative for dizziness, focal weakness, weakness and headaches.  Psychiatric/Behavioral: Negative.  The patient is not nervous/anxious.     As per HPI. Otherwise, a complete review of systems is negative.  PAST MEDICAL HISTORY: Past Medical History:  Diagnosis Date   Back pain    Borderline diabetes mellitus 11/09/2015   BPH (benign prostatic hypertrophy)    Dysrhythmia    Elevated PSA    Heart murmur    History of elevated PSA 05/21/2015   Hydrocele    Hypertension    Myocardial infarction [facility])    Nocturia    Stroke (HCC)    ministroke   TIA (transient  ischemic attack) 11/02/2013   Urinary frequency     PAST SURGICAL HISTORY: Past Surgical History:  Procedure Laterality Date   ABDOMINAL AORTIC ANEURYSM REPAIR  06/19/2020   Endovascular aneurysm repairGore Excluder 23x14x14 Contralateral right iliac limb, 14x10    AORTIC VALVE REPLACEMENT N/A 11/15/2013   Procedure: AORTIC VALVE REPLACEMENT (AVR);  Surgeon: Alleen Borne, MD;  Location: [facility] OR;  Service: Open Heart Surgery;  Laterality: N/A;   COLONOSCOPY WITH PROPOFOL N/A 02/15/2016   Procedure: COLONOSCOPY WITH PROPOFOL;  Surgeon: Christena Deem, MD;  Location: [facility] ENDOSCOPY;  Service: Endoscopy;  Laterality: N/A;   HERNIA REPAIR  1954   bilateral - inguinal    HYDROCELE EXCISION Right 01/17/2019   Procedure: HYDROCELECTOMY ADULT;  Surgeon: Riki Altes, MD;  Location: [facility] ORS;  Service: Urology;  Laterality: Right;   INTRAOPERATIVE TRANSESOPHAGEAL ECHOCARDIOGRAM N/A 11/15/2013   Procedure: INTRAOPERATIVE TRANSESOPHAGEAL ECHOCARDIOGRAM;  Surgeon: Alleen Borne, MD;  Location: [facility] OR;  Service: Open Heart Surgery;  Laterality: N/A;   LEFT HEART CATHETERIZATION WITH CORONARY ANGIOGRAM N/A 11/02/2013   Procedure: LEFT HEART CATHETERIZATION WITH CORONARY ANGIOGRAM;  Surgeon: Pamella Pert, MD;  Location: [facility] CATH LAB;  Service: Cardiovascular;  Laterality: N/A;    FAMILY HISTORY: Family History  Problem Relation Age of Onset   Prostate cancer Father    Heart attack Mother    Hypertension Brother    Coronary artery disease Brother    Kidney disease Neg Hx    Kidney cancer Neg Hx    Bladder Cancer Neg Hx     ADVANCED DIRECTIVES (  Y/N):  N  HEALTH MAINTENANCE: Social History   Tobacco Use   Smoking status: Former    Types: Cigars, Pipe   Smokeless tobacco: Never   Tobacco comments:    quit 60 years  Vaping Use   Vaping status: Never Used  Substance Use Topics   Alcohol use: Yes    Comment: Rarely- beer, glass of wine    Drug use: No      Colonoscopy:  PAP:  Bone density:  Lipid panel:  No Known Allergies  Current Outpatient Medications  Medication Sig Dispense Refill   aspirin 81 MG chewable tablet Chew 81 mg by mouth 2 (two) times daily.     atorvastatin (LIPITOR) 10 MG tablet TAKE 1 TABLET DAILY 90 tablet 3   augmented betamethasone dipropionate (DIPROLENE-AF) 0.05 % cream Apply topically 2 (two) times daily.     cyanocobalamin (VITAMIN B12) 1000 MCG tablet Take 1,000 mcg by mouth daily.     ferrous sulfate 325 (65 FE) MG EC tablet Take 325 mg by mouth 3 (three) times daily with meals.     ketoconazole (NIZORAL) 2 % shampoo Apply topically 3 (three) times a week.     olmesartan-hydrochlorothiazide (BENICAR HCT) 20-12.5 MG tablet Take 1 tablet by mouth daily. 90 tablet 3   traZODone (DESYREL) 50 MG tablet Take 150 mg by mouth at bedtime.     amoxicillin (AMOXIL) 500 MG capsule Take 1,000 mg by mouth 2 (two) times daily. (Patient not taking: Reported on 07/14/2023)     No current facility-administered medications for this visit.    OBJECTIVE: Vitals:   07/14/23 1021  BP: 133/80  Pulse: 60  Resp: 16  Temp: (!) 96.7 F (35.9 C)  SpO2: 100%     Body mass index is 21.97 kg/m.    ECOG FS:0 - Asymptomatic  General: Well-developed, well-nourished, no acute distress. Eyes: Pink conjunctiva, anicteric sclera. HEENT: Normocephalic, moist mucous membranes. Lungs: No audible wheezing or coughing. Heart: Regular rate and rhythm. Abdomen: Soft, nontender, no obvious distention. Musculoskeletal: No edema, cyanosis, or clubbing. Neuro: Alert, answering all questions appropriately. Cranial nerves grossly intact. Skin: No rashes or petechiae noted. Psych: Normal affect.  LAB RESULTS:  Lab Results  Component Value Date   NA 138 05/23/2020   K 3.7 05/23/2020   CL 101 05/23/2020   CO2 28 05/23/2020   GLUCOSE 120 (H) 05/23/2020   BUN 21 05/23/2020   CREATININE 0.89 05/23/2020   CALCIUM 8.9 05/23/2020   PROT  6.8 05/23/2020   ALBUMIN 3.7 05/23/2020   AST 18 05/23/2020   ALT 17 05/23/2020   ALKPHOS 72 05/23/2020   BILITOT 1.2 05/23/2020   GFRNONAA >60 05/23/2020   GFRAA >60 10/29/2014    Lab Results  Component Value Date   WBC 4.6 07/13/2023   NEUTROABS 3.3 07/13/2023   HGB 12.2 (L) 07/13/2023   HCT 37.4 (L) 07/13/2023   MCV 90.8 07/13/2023   PLT 129 (L) 07/13/2023   Lab Results  Component Value Date   IRON 73 07/13/2023   TIBC 336 07/13/2023   IRONPCTSAT 22 07/13/2023   Lab Results  Component Value Date   FERRITIN 16 (L) 07/13/2023     STUDIES: No results found.  ASSESSMENT: Pancytopenia.  PLAN:    Leukopenia: Patient's total white blood cell count continues to be within normal limits.   Anemia: Patient's hemoglobin continues to trend up and is now essentially within normal limits at 12.2.  His B12 deficiency has resolved with oral supplementation.  He continues to have a mildly decreased ferritin at 16, but this has also improved since initiating oral iron supplementation.  The remainder of his iron panel is within normal limits.  Patient has been instructed to continue both B12 and oral treatments.  Previously, all of his other laboratory work was either negative or within normal limits.  No further intervention is needed.  Thrombocytopenia: Chronic and unchanged.  Patient's platelet count has ranged between 84 and 150 since at least May 2015.  Today's result is 129.  No intervention is needed.  Patient does not require bone marrow biopsy.  After lengthy discussion of the patient, it was agreed upon that no further follow-up is necessary.  Please refer patient back if there are any questions or concerns.      Patient expressed understanding and was in agreement with this plan. He also understands that He can call clinic at any time with any questions, concerns, or complaints.     Jeralyn Ruths, MD   07/14/2023 10:32 AM

## 2023-07-31 ENCOUNTER — Other Ambulatory Visit: Payer: Self-pay | Admitting: Internal Medicine

## 2023-07-31 DIAGNOSIS — E7800 Pure hypercholesterolemia, unspecified: Secondary | ICD-10-CM

## 2023-08-12 ENCOUNTER — Other Ambulatory Visit: Payer: Self-pay | Admitting: Orthopedic Surgery

## 2023-08-12 DIAGNOSIS — M4807 Spinal stenosis, lumbosacral region: Secondary | ICD-10-CM

## 2023-08-18 ENCOUNTER — Ambulatory Visit: Payer: Medicare HMO

## 2023-08-21 ENCOUNTER — Ambulatory Visit: Payer: Medicare HMO | Admitting: Internal Medicine

## 2023-08-21 ENCOUNTER — Other Ambulatory Visit: Payer: Self-pay | Admitting: Orthopedic Surgery

## 2023-08-21 DIAGNOSIS — M4807 Spinal stenosis, lumbosacral region: Secondary | ICD-10-CM

## 2023-08-26 ENCOUNTER — Ambulatory Visit: Payer: Medicare HMO | Attending: Internal Medicine | Admitting: Cardiology

## 2023-08-26 ENCOUNTER — Encounter: Payer: Self-pay | Admitting: Orthopedic Surgery

## 2023-08-26 NOTE — Progress Notes (Deleted)
  Cardiology Office Note:  .   Date:  08/26/2023  ID:  Willie Freeman, DOB 03/28/1946, MRN [id number] PCP: Marisue Ivan, MD  [facility] Providers Cardiologist:  None { Click to update primary MD,subspecialty MD or APP then REFRESH:1}  History of Present Illness: .   Willie Freeman is a 78 y.o. 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  Discussed the use of AI scribe software for clinical note transcription with the patient, who gave verbal consent to proceed.  History of Present Illness            Labs   External Labs:  Labs 08/12/2023 on Care Everywhere  Hb 11.8/HCT 37.6, platelets 148.  Serum glucose 100, sodium 142, potassium 4.2, BUN 27, creatinine 0.8, EGFR 90 mL, LFTs normal.  A1c 6.0%.  Total cholesterol 127, triglycerides 55, HDL 49, LDL 66.  ***ROS Physical Exam:   VS:  There were no vitals taken for this visit.   Wt Readings from Last 3 Encounters:  07/14/23 157 lb 8 oz (71.4 kg)  04/13/23 156 lb 11.2 oz (71.1 kg)  02/19/23 154 lb 12.8 oz (70.2 kg)     ***Physical Exam Studies Reviewed: .    *** EKG:         ***  Medications and allergies    No Known Allergies   Current Outpatient Medications:    amoxicillin (AMOXIL) 500 MG capsule, Take 1,000 mg by mouth 2 (two) times daily. (Patient not taking: Reported on 07/14/2023), Disp: , Rfl:    aspirin 81 MG chewable tablet, Chew 81 mg by mouth 2 (two) times daily., Disp: , Rfl:    atorvastatin (LIPITOR) 10 MG tablet, TAKE 1 TABLET DAILY, Disp: 90 tablet, Rfl: 3   augmented betamethasone dipropionate (DIPROLENE-AF) 0.05 % cream, Apply topically 2 (two) times daily., Disp: , Rfl:    cyanocobalamin (VITAMIN B12) 1000 MCG tablet, Take 1,000 mcg by mouth daily., Disp: , Rfl:    ferrous sulfate 325 (65 FE) MG EC tablet, Take 325 mg by mouth 3 (three)  times daily with meals., Disp: , Rfl:    ketoconazole (NIZORAL) 2 % shampoo, Apply topically 3 (three) times a week., Disp: , Rfl:    olmesartan-hydrochlorothiazide (BENICAR HCT) 20-12.5 MG tablet, Take 1 tablet by mouth daily., Disp: 90 tablet, Rfl: 3   traZODone (DESYREL) 50 MG tablet, Take 150 mg by mouth at bedtime., Disp: , Rfl:    ASSESSMENT AND PLAN: .      ICD-10-CM   1. Essential hypertension  I10     2. S/P AAA repair on 06/19/2020: Gore Excluder [id number] Contralateral right iliac limb, 14x10  Z98.890    Z86.79     3. H/O aortic valve replacement with  MAGNA EASE AORTIC porcine valve05/26/2025  Z95.3       1. Essential hypertension ***  2. S/P AAA repair ***  3. H/O aortic valve replacement with porcine valve ***  Assessment and Plan                Signed,  Yates Decamp, MD, [facility] 08/26/2023, 7:15 AM [facility] [address] [place], [place] [address] Phone: [phone]. Fax:  [phone]

## 2023-08-28 ENCOUNTER — Encounter: Payer: Self-pay | Admitting: Cardiology

## 2023-09-03 ENCOUNTER — Telehealth: Payer: Self-pay | Admitting: Cardiology

## 2023-09-03 NOTE — Telephone Encounter (Signed)
 Pt called in about received no show letter. He said he went to wrong address and shouldn't be charged for that. Please advise.

## 2023-09-04 ENCOUNTER — Ambulatory Visit
Admission: RE | Admit: 2023-09-04 | Discharge: 2023-09-04 | Disposition: A | Discharge status: Home / Self Care | Source: Ambulatory Visit | Attending: Orthopedic Surgery | Admitting: Orthopedic Surgery

## 2023-09-04 DIAGNOSIS — M4807 Spinal stenosis, lumbosacral region: Secondary | ICD-10-CM

## 2023-09-22 ENCOUNTER — Other Ambulatory Visit: Payer: Self-pay | Admitting: Family Medicine

## 2023-09-22 DIAGNOSIS — I714 Abdominal aortic aneurysm, without rupture, unspecified: Secondary | ICD-10-CM

## 2023-09-28 ENCOUNTER — Ambulatory Visit
Admission: RE | Admit: 2023-09-28 | Discharge: 2023-09-28 | Disposition: A | Discharge status: Home / Self Care | Source: Ambulatory Visit | Attending: Family Medicine | Admitting: Family Medicine

## 2023-09-28 DIAGNOSIS — I714 Abdominal aortic aneurysm, without rupture, unspecified: Secondary | ICD-10-CM | POA: Diagnosis present

## 2023-10-27 NOTE — Progress Notes (Unsigned)
 Cardiology Office Note:  .   Date:  10/28/2023  ID:  Willie Freeman, DOB [birth year], MRN [id number] PCP: Monique Ano, MD  [facility] Providers Cardiologist:  None   History of Present Illness: .   Willie Freeman is a 78 y.o. 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  Underwent endovascular repair on 06/19/2020 at [facility] by  Dr. Tery Fielding.  Discussed the use of AI scribe software for clinical note transcription with the patient, who gave verbal consent to proceed.  History of Present Illness Willie Freeman is a 78 year old male with a history of aortic valve replacement who presents for a cardiovascular follow-up. He remains very active, walking daily, and has experienced weight loss from 170 to 156 pounds, which he attributes to his active lifestyle. His wife is concerned about the weight loss, but he does not find it problematic.  An abdominal ultrasound showed a stable aneurysm. He underwent a bioprosthetic aortic valve replacement in 2015. He visits a dentist every six months to a year and takes antibiotics before cleanings.  He is on olmesartan  HCT 20/12.5 for blood pressure management and occasionally checks his blood pressure at home, noting it is usually in the 140s.   Labs      Latest Ref Rng & Units 07/13/2023   10:51 AM 02/19/2023    3:41 PM 05/23/2020   10:48 PM  CBC  WBC 4.0 - 10.5 K/uL 4.6  4.3  6.9   Hemoglobin 13.0 - 17.0 g/dL 08.6  57.8  46.9   Hematocrit 39.0 - 52.0 % 37.4  35.7  37.8   Platelets 150 - 400 K/uL 129  145  150    Lab Results  Component Value Date   HGBA1C 5.5 11/11/2013    External Labs:  Labs on Care Everywhere 08/12/2023:  Hb 11.8/HCT 37.6,  platelets 148.  Microcytic indicis.  Stable from 02/06/2023.  Serum glucose 100 mg, potassium 4.2, BUN 27, creatinine 0.8, EGFR 90 mL, LFTs normal.  A1c 6.0%.  Total cholesterol 127, triglycerides 55, HDL 49, LDL 66.  ROS  Review of Systems  Cardiovascular:  Negative for chest pain, dyspnea on exertion and leg swelling.    Physical Exam:   VS:  BP (!) 140/84 (BP Location: Left Arm, Patient Position: Sitting, Cuff Size: Large)   Pulse (!) 54   Resp 16   Ht 5\' 11"  (1.803 m)   Wt 156 lb 3.2 oz (70.9 kg)   SpO2 96%   BMI 21.79 kg/m    Wt Readings from Last 3 Encounters:  10/28/23 156 lb 3.2 oz (70.9 kg)  07/14/23 157 lb 8 oz (71.4 kg)  04/13/23 156 lb 11.2 oz (71.1 kg)    Physical Exam Neck:     Vascular: No carotid bruit or JVD.  Cardiovascular:     Rate and Rhythm: Normal rate and regular rhythm.     Pulses: Intact distal pulses.     Heart sounds: S1 normal and S2 normal. Murmur heard.     Early systolic murmur is present with a grade of 2/6 at the upper right sternal border.     No gallop.  Pulmonary:     Effort: Pulmonary effort is normal.  Breath sounds: Normal breath sounds.  Abdominal:     General: Bowel sounds are normal.     Palpations: Abdomen is soft.  Musculoskeletal:     Right lower leg: No edema.     Left lower leg: No edema.    Studies Reviewed: Aaron Aas    Endovascular abdominal aortic aneurysm repair 06/19/2020: Gore Excluder [id number] Contralateral right iliac limb, 14x10 at [facility]  Echocardiogram 03/21/2022:  Normal LV systolic function with visual EF 60-65%. Left ventricle cavity is normal in size. Normal global wall motion. Mild left ventricular hypertrophy. Unable to evaluate diastolic function due to suboptimal image acquisition. Left atrial cavity is mildly dilated at 37.82 ml/m^2. Right atrial cavity is mildly dilated. Right ventricle cavity is mildly dilated. Normal right ventricular function. Bioprosthetic aortic valve. No evidence of  aortic stenosis. No aortic valve regurgitation. Mild tricuspid regurgitation. No evidence of pulmonary hypertension. Mild pulmonic regurgitation. The aortic root is normal. Ascending aortic aneurysm, 46mm. Compared to 07/20/2018 trace aortic stenosis is not present on current study & ascending aortic aneurysm was not mentioned on prior study.  Abdominal aortic duplex 09/29/2023: Comparison CT abdomen pelvis 05/24/2020  Infrarenal abdominal aortic aneurysm measuring up to 3.4 cm, similar to prior CT of the abdomen and pelvis.  EKG:    EKG Interpretation Date/Time:  Wednesday Oct 28 2023 08:12:32 EDT Ventricular Rate:  54 PR Interval:  188 QRS Duration:  96 QT Interval:  484 QTC Calculation: 458 R Axis:   19  Text Interpretation: EKG 10/28/2023: Normal sinus rhythm at rate of 54 bpm, normal EKG.  No significant change from 05/23/2020. Confirmed by Tate Jerkins, Jagadeesh ([id number]) on 10/28/2023 8:18:09 AM    Medications and allergies    No Known Allergies   Current Outpatient Medications:    amLODipine  (NORVASC ) 5 MG tablet, Take 1 tablet (5 mg total) by mouth daily., Disp: 90 tablet, Rfl: 3   amoxicillin (AMOXIL) 500 MG capsule, Take 1,000 mg by mouth 2 (two) times daily., Disp: , Rfl:    aspirin  81 MG chewable tablet, Chew 81 mg by mouth 2 (two) times daily., Disp: , Rfl:    atorvastatin  (LIPITOR ) 10 MG tablet, TAKE 1 TABLET DAILY, Disp: 90 tablet, Rfl: 3   augmented betamethasone dipropionate (DIPROLENE-AF) 0.05 % cream, Apply topically 2 (two) times daily., Disp: , Rfl:    cyanocobalamin  (VITAMIN B12) 1000 MCG tablet, Take 1,000 mcg by mouth daily., Disp: , Rfl:    ferrous sulfate  325 (65 FE) MG EC tablet, Take 325 mg by mouth 3 (three) times daily with meals., Disp: , Rfl:    ketoconazole (NIZORAL) 2 % shampoo, Apply topically 3 (three) times a week., Disp: , Rfl:    olmesartan -hydrochlorothiazide (BENICAR  HCT) 20-12.5 MG tablet, Take 1 tablet by mouth daily., Disp: 90 tablet, Rfl: 3    traZODone (DESYREL) 50 MG tablet, Take 150 mg by mouth at bedtime., Disp: , Rfl:    Meds ordered this encounter  Medications   amLODipine  (NORVASC ) 5 MG tablet    Sig: Take 1 tablet (5 mg total) by mouth daily.    Dispense:  90 tablet    Refill:  3     There are no discontinued medications.   ASSESSMENT AND PLAN: .      ICD-10-CM   1. Essential hypertension  I10 EKG 12-Lead    amLODipine  (NORVASC ) 5 MG tablet    2. H/O abdominal aortic aneurysm repair 06/19/2020  Z98.890 EKG 12-Lead    3. H/O aortic valve replacement with  porcine valve  Z95.3 EKG 12-Lead    4. Indication present for endocarditis prophylaxis  Z29.89       Assessment and Plan Assessment & Plan Aortic valve replacement, bioprosthetic   The bioprosthetic aortic valve replacement from 2015 remains in good condition with no changes on recent evaluation. He is aware of the need for dental prophylaxis with antibiotics before dental procedures.  Heart murmur   The slight heart murmur remains unchanged on examination.  Abdominal aortic aneurysm   An abdominal ultrasound on 09/29/2023 shows no changes in the abdominal aortic aneurysm.  Hypertension   Hypertension is managed with omisartan HCT 20/12.5. Home blood pressure readings are mostly in the 140s, above the target of 130s. No issues with constipation are reported. Add amlodipine  5 mg once daily to achieve target blood pressure in the 130s. Prescribe amlodipine  for 90 days with a one-year supply. Advise home blood pressure monitoring.  External labs reviewed, renal function is normal, lipids are well-controlled.  I will see him back in a year.   Signed,  Knox Perl, MD, [facility] 10/28/2023, 8:29 AM [facility] [address] [place], [place] [address] Phone: [phone]. Fax:  [phone]

## 2023-10-28 ENCOUNTER — Encounter: Payer: Self-pay | Admitting: Cardiology

## 2023-10-28 ENCOUNTER — Ambulatory Visit: Attending: Cardiology | Admitting: Cardiology

## 2023-10-28 VITALS — BP 140/84 | HR 54 | Resp 16 | Ht 71.0 in | Wt 156.2 lb

## 2023-10-28 DIAGNOSIS — I10 Essential (primary) hypertension: Secondary | ICD-10-CM

## 2023-10-28 DIAGNOSIS — Z2989 Encounter for other specified prophylactic measures: Secondary | ICD-10-CM

## 2023-10-28 DIAGNOSIS — Z9889 Other specified postprocedural states: Secondary | ICD-10-CM | POA: Diagnosis not present

## 2023-10-28 DIAGNOSIS — Z953 Presence of xenogenic heart valve: Secondary | ICD-10-CM | POA: Diagnosis not present

## 2023-10-28 MED ORDER — AMLODIPINE BESYLATE 5 MG PO TABS: 5.0000 mg | ORAL_TABLET | Freq: Every day | ORAL | 3 refills | Status: AC

## 2023-10-28 NOTE — Patient Instructions (Signed)
 Medication Instructions:  Your physician recommends that you continue on your current medications as directed. Please refer to the Current Medication list given to you today.  *If you need a refill on your cardiac medications before your next appointment, please call your pharmacy*  Follow-Up: At [facility], you and your health needs are our priority.  As part of our continuing mission to provide you with exceptional heart care, our providers are all part of one team.  This team includes your primary Cardiologist (physician) and Advanced Practice Providers or APPs (Physician Assistants and Nurse Practitioners) who all work together to provide you with the care you need, when you need it.  Your next appointment:   1 year(s)  The format for your next appointment:   In Person  Provider:   Knox Perl, MD{  We recommend signing up for the patient portal called "MyChart".  Sign up information is provided on this After Visit Summary.  MyChart is used to connect with patients for Virtual Visits (Telemedicine).  Patients are able to view lab/test results, encounter notes, upcoming appointments, etc.  Non-urgent messages can be sent to your provider as well.   To learn more about what you can do with MyChart, go to [email or web address].

## 2023-11-19 ENCOUNTER — Other Ambulatory Visit: Payer: Self-pay

## 2023-11-19 MED ORDER — OLMESARTAN MEDOXOMIL-HCTZ 20-12.5 MG PO TABS: 1.0000 | ORAL_TABLET | Freq: Every day | ORAL | 3 refills | Status: AC

## 2024-06-09 ENCOUNTER — Other Ambulatory Visit: Payer: Self-pay

## 2024-06-09 ENCOUNTER — Emergency Department: Admission: EM | Admit: 2024-06-09 | Discharge: 2024-06-09 | Disposition: A | Discharge status: Home / Self Care

## 2024-06-09 ENCOUNTER — Emergency Department

## 2024-06-09 DIAGNOSIS — S7012XA Contusion of left thigh, initial encounter: Secondary | ICD-10-CM | POA: Diagnosis not present

## 2024-06-09 DIAGNOSIS — S4992XA Unspecified injury of left shoulder and upper arm, initial encounter: Secondary | ICD-10-CM | POA: Insufficient documentation

## 2024-06-09 DIAGNOSIS — Y990 Civilian activity done for income or pay: Secondary | ICD-10-CM | POA: Diagnosis not present

## 2024-06-09 DIAGNOSIS — T148XXA Other injury of unspecified body region, initial encounter: Secondary | ICD-10-CM

## 2024-06-09 DIAGNOSIS — W208XXA Other cause of strike by thrown, projected or falling object, initial encounter: Secondary | ICD-10-CM | POA: Insufficient documentation

## 2024-06-09 LAB — BASIC METABOLIC PANEL WITH GFR
Anion gap: 13 (ref 5–15)
BUN: 24 mg/dL — ABNORMAL HIGH (ref 8–23)
CO2: 25 mmol/L (ref 22–32)
Calcium: 8.7 mg/dL — ABNORMAL LOW (ref 8.9–10.3)
Chloride: 101 mmol/L (ref 98–111)
Creatinine, Ser: 0.91 mg/dL (ref 0.61–1.24)
GFR, Estimated: >60 mL/min (ref >60)
Glucose, Bld: 132 mg/dL — ABNORMAL HIGH (ref 70–99)
Potassium: 3.4 mmol/L — ABNORMAL LOW (ref 3.5–5.1)
Sodium: 140 mmol/L (ref 135–145)

## 2024-06-09 LAB — CBC
HCT: 35.1 % — ABNORMAL LOW (ref 39.0–52.0)
Hemoglobin: 11.7 g/dL — ABNORMAL LOW (ref 13.0–17.0)
MCH: 29.5 pg (ref 26.0–34.0)
MCHC: 33.3 g/dL (ref 30.0–36.0)
MCV: 88.6 fL (ref 80.0–100.0)
Platelets: 162 K/uL (ref 150–400)
RBC: 3.96 MIL/uL — ABNORMAL LOW (ref 4.22–5.81)
RDW: 13.9 % (ref 11.5–15.5)
WBC: 10.8 K/uL — ABNORMAL HIGH (ref 4.0–10.5)
nRBC: 0 % (ref 0.0–0.2)

## 2024-06-09 MED ORDER — OXYCODONE-ACETAMINOPHEN 5-325 MG PO TABS: 1.0000 | ORAL_TABLET | Freq: Once | ORAL | Status: DC

## 2024-06-09 MED ORDER — MORPHINE SULFATE (PF) 4 MG/ML IV SOLN
4.0000 mg | Freq: Once | INTRAVENOUS | Status: AC
  Administered 2024-06-09: 18:00:00 4 mg via INTRAVENOUS
  Filled 2024-06-09: qty 1

## 2024-06-09 MED ORDER — SODIUM CHLORIDE 0.9 % IV BOLUS
1000.0000 mL | Freq: Once | INTRAVENOUS | Status: AC
  Administered 2024-06-09: 18:00:00 1000 mL via INTRAVENOUS

## 2024-06-09 MED ORDER — OXYCODONE HCL 5 MG PO TABS
5.0000 mg | ORAL_TABLET | Freq: Once | ORAL | Status: AC
  Administered 2024-06-09: 19:00:00 5 mg via ORAL
  Filled 2024-06-09: qty 1

## 2024-06-09 MED FILL — Oxycodone w/ Acetaminophen Tab 5-325 MG: 1.0000 | ORAL | Qty: 1 | Status: CN

## 2024-06-09 NOTE — ED Notes (Signed)
 Pt has fall bracelet on with shoes on at this time. Pt has call bell by bedside with family also by bedside.

## 2024-06-09 NOTE — ED Notes (Signed)
 Pt ambulated well with a walker. Pt denies dizziness, weakness, SOB, and headache at this time.

## 2024-06-09 NOTE — ED Provider Notes (Signed)
 [facility] Provider Note    Event Date/Time   First MD Initiated Contact with Patient 06/09/24 1654     (approximate)   History   Fall   HPI  Willie Freeman is a 78 y.o. male presenting with concern of trauma.  Apparently a electric motor fell on his shoulder and left leg today around 1 PM.  He denies any trauma to the head did not fall over but significant pain along his leg since then to the point where he has been having trouble walking and lifting his left arm.  The takes aspirin  no other blood thinner usage.  Denies trauma elsewhere no other complaints at this time.     Physical Exam   Triage Vital Signs: ED Triage Vitals  Encounter Vitals Group     BP 06/09/24 1421 (!) 141/77     Girls Systolic BP Percentile --      Girls Diastolic BP Percentile --      Boys Systolic BP Percentile --      Boys Diastolic BP Percentile --      Pulse Rate 06/09/24 1421 65     Resp 06/09/24 1421 18     Temp 06/09/24 1421 98 F (36.7 C)     Temp src --      SpO2 06/09/24 1421 98 %     Weight 06/09/24 1419 159 lb (72.1 kg)     Height 06/09/24 1419 5' 11 (1.803 m)     Head Circumference --      Peak Flow --      Pain Score 06/09/24 1419 8     Pain Loc --      Pain Education --      Exclude from Growth Chart --     Most recent vital signs: Vitals:   06/09/24 1825 06/09/24 1934  BP: 138/76 127/86  Pulse: 72 76  Resp: 18 16  Temp: 98.3 F (36.8 C) 98 F (36.7 C)  SpO2: 100% 98%     General: Awake, tremulous and and appears uncomfortable CV:  Good peripheral perfusion.  Resp:  Normal effort.  Abd:  No distention.  Soft nontender MSK:  No noted cervical thoracic lumbar spinal tenderness, he has pain along his lateral left shoulder and upper arm, I do not appreciate any significant swelling ecchymoses deformities, he is able to lift his forearm all the way up to touch his shoulder, normal range of motion of the elbow, limited range of motion of the  shoulder with abduction secondary to pain.  The left lower leg appears to have a hematoma along the left internal thigh.  Remaining compartments appear to be soft and he is appropriate pulses in the lower extremities and normal sensation Other:     ED Results / Procedures / Treatments   Labs (all labs ordered are listed, but only abnormal results are displayed) Labs Reviewed  CBC - Abnormal; Notable for the following components:      Result Value   WBC 10.8 (*)    RBC 3.96 (*)    Hemoglobin 11.7 (*)    HCT 35.1 (*)    All other components within normal limits  BASIC METABOLIC PANEL WITH GFR - Abnormal; Notable for the following components:   Potassium 3.4 (*)    Glucose, Bld 132 (*)    BUN 24 (*)    Calcium  8.7 (*)    All other components within normal limits     EKG  RADIOLOGY No acute findings on my independent interpretation of x-ray imaging of the shoulder wrist and femur  PROCEDURES:  Critical Care performed: No  Procedures   MEDICATIONS ORDERED IN ED: Medications  sodium chloride  0.9 % bolus 1,000 mL (0 mLs Intravenous Stopped 06/09/24 1914)  morphine  (PF) 4 MG/ML injection 4 mg (4 mg Intravenous Given 06/09/24 1736)  oxyCODONE  (Oxy IR/ROXICODONE ) immediate release tablet 5 mg (5 mg Oral Given 06/09/24 1914)     IMPRESSION / MDM / ASSESSMENT AND PLAN / ED COURSE  I reviewed the triage vital signs and the nursing notes.                               Patient's presentation is most consistent with acute, uncomplicated illness.  78 year old male who presents today with concern of trauma.  He had an electric motor fall on him.  He states it was a small motor, significant pain along his left thigh and left shoulder.  Large hematoma noted along his left thigh, vitals fortunately appear reassuring and x-ray without acute findings.  Given the exam at this time we are applying compression along the area of the hematoma I am obtaining labs to get a baseline  hemoglobin, and we will apply compression and ice along the area of the hematoma.  He is able to ambulate it seems, ideally we are able to get adequate pain control and anticipate likely discharge home.   Clinical Course as of 06/09/24 2354  Thu Jun 09, 2024  1726 Patient attempted to stand up and became very lightheaded and passed out, fell onto the stretcher we were able to assist him back on to his back.  Will give some fluids and IV morphine  and continue to monitor. [SK]  1819 The patient's hemoglobin appears stable, his pain seems to be better controlled now, will have him ambulated and discuss safe disposition. [SK]  1902 Patient symptoms seem to be much improved at this time, will have him ambulated, I discussed return precautions, he has a walker available at home which she will use over the next few days.  Will have him discharged at this time. [SK]    Clinical Course User Index [SK] Fernand Rossie HERO, MD     FINAL CLINICAL IMPRESSION(S) / ED DIAGNOSES   Final diagnoses:  Hematoma  Contusion of left thigh, initial encounter  Injury of left shoulder, initial encounter     Rx / DC Orders   ED Discharge Orders          Ordered    oxyCODONE  (ROXICODONE ) 5 MG immediate release tablet  Every 4 hours PRN        06/09/24 1906             Note:  This document was prepared using Dragon voice recognition software and may include unintentional dictation errors.   Fernand Rossie HERO, MD 06/09/24 [id number]

## 2024-06-09 NOTE — ED Triage Notes (Signed)
 Pt comes with c/o fall while at work. Pt states a motor fell and he caught it causing him to fall. Pt states it hit his inner left thigh. Pt states pain and can't raise his left arm and shoulder

## 2024-06-09 NOTE — Discharge Instructions (Addendum)
 You were seen today due to concern of trauma to your hip.  It seems that you have a large hematoma in your left leg as well as muscular pain in your left shoulder.  I would recommend applying compression to your leg as well as heating pads and ice pads, and taking it easy over the next few days.  It will likely turn purple however if you start noticing significant increase in swelling pain down the leg, difficulty walking, or any other symptoms you find concerning please return to the emergency department immediately for further medical management.  You should otherwise follow-up with the orthopedic surgeon to have this further assessed and evaluated.  You may take Tylenol  and ibuprofen at home to help manage her symptoms.  I have written for some oxycodone  for you to take which is a stronger pain medication can make you lightheaded and dizzy, please avoid driving or operating any heavy machinery while taking this medication.  If you have any worsening of symptoms such as uncontrolled pain, lightheadedness, or any other symptoms you find concerning please return to the emergency department immediately for further assessment and evaluation.

## 2024-10-27 ENCOUNTER — Ambulatory Visit: Admitting: Cardiology
